# Patient Record
Sex: Female | Born: 1935
Health system: Southern US, Community
[De-identification: ages and names within clinical notes are randomized; demographics above are authoritative.]

## PROBLEM LIST (undated history)

## (undated) DIAGNOSIS — Z8719 Personal history of other diseases of the digestive system: Secondary | ICD-10-CM

## (undated) DIAGNOSIS — I6529 Occlusion and stenosis of unspecified carotid artery: Secondary | ICD-10-CM

## (undated) DIAGNOSIS — J189 Pneumonia, unspecified organism: Secondary | ICD-10-CM

## (undated) DIAGNOSIS — E7800 Pure hypercholesterolemia, unspecified: Secondary | ICD-10-CM

## (undated) DIAGNOSIS — K219 Gastro-esophageal reflux disease without esophagitis: Secondary | ICD-10-CM

## (undated) HISTORY — PX: COLONOSCOPY: SHX174

## (undated) HISTORY — DX: Gastro-esophageal reflux disease without esophagitis: K21.9

## (undated) HISTORY — DX: Occlusion and stenosis of unspecified carotid artery: I65.29

## (undated) HISTORY — PX: ABDOMINAL HYSTERECTOMY: SHX81

## (undated) HISTORY — DX: Pure hypercholesterolemia, unspecified: E78.00

---

## 1958-03-03 DIAGNOSIS — J189 Pneumonia, unspecified organism: Secondary | ICD-10-CM

## 1958-03-03 HISTORY — DX: Pneumonia, unspecified organism: J18.9

## 1997-09-14 ENCOUNTER — Other Ambulatory Visit: Admission: RE | Admit: 1997-09-14 | Discharge: 1997-09-14 | Payer: Self-pay | Admitting: *Deleted

## 1998-10-25 ENCOUNTER — Other Ambulatory Visit: Admission: RE | Admit: 1998-10-25 | Discharge: 1998-10-25 | Payer: Self-pay | Admitting: *Deleted

## 1999-05-10 ENCOUNTER — Encounter: Admission: RE | Admit: 1999-05-10 | Discharge: 1999-05-10 | Payer: Self-pay | Admitting: Pulmonary Disease

## 1999-05-10 ENCOUNTER — Encounter: Payer: Self-pay | Admitting: Pulmonary Disease

## 1999-10-18 ENCOUNTER — Other Ambulatory Visit: Admission: RE | Admit: 1999-10-18 | Discharge: 1999-10-18 | Payer: Self-pay | Admitting: *Deleted

## 2000-05-12 ENCOUNTER — Encounter: Admission: RE | Admit: 2000-05-12 | Discharge: 2000-05-12 | Payer: Self-pay | Admitting: Pulmonary Disease

## 2000-05-12 ENCOUNTER — Encounter: Payer: Self-pay | Admitting: Pulmonary Disease

## 2000-11-16 ENCOUNTER — Other Ambulatory Visit: Admission: RE | Admit: 2000-11-16 | Discharge: 2000-11-16 | Payer: Self-pay | Admitting: *Deleted

## 2001-05-14 ENCOUNTER — Encounter: Payer: Self-pay | Admitting: *Deleted

## 2001-05-14 ENCOUNTER — Encounter: Admission: RE | Admit: 2001-05-14 | Discharge: 2001-05-14 | Payer: Self-pay | Admitting: *Deleted

## 2002-05-16 ENCOUNTER — Encounter: Payer: Self-pay | Admitting: Pulmonary Disease

## 2002-05-16 ENCOUNTER — Encounter: Admission: RE | Admit: 2002-05-16 | Discharge: 2002-05-16 | Payer: Self-pay | Admitting: Pulmonary Disease

## 2003-03-01 ENCOUNTER — Other Ambulatory Visit: Admission: RE | Admit: 2003-03-01 | Discharge: 2003-03-01 | Payer: Self-pay | Admitting: Gynecology

## 2003-05-17 ENCOUNTER — Encounter: Admission: RE | Admit: 2003-05-17 | Discharge: 2003-05-17 | Payer: Self-pay | Admitting: Pulmonary Disease

## 2004-01-22 ENCOUNTER — Ambulatory Visit: Payer: Self-pay | Admitting: Pulmonary Disease

## 2004-05-29 ENCOUNTER — Encounter: Admission: RE | Admit: 2004-05-29 | Discharge: 2004-05-29 | Payer: Self-pay | Admitting: Pulmonary Disease

## 2004-08-16 ENCOUNTER — Ambulatory Visit: Payer: Self-pay | Admitting: Pulmonary Disease

## 2005-03-26 ENCOUNTER — Ambulatory Visit: Payer: Self-pay | Admitting: Pulmonary Disease

## 2005-04-28 ENCOUNTER — Other Ambulatory Visit: Admission: RE | Admit: 2005-04-28 | Discharge: 2005-04-28 | Payer: Self-pay | Admitting: Obstetrics and Gynecology

## 2005-06-18 ENCOUNTER — Encounter: Admission: RE | Admit: 2005-06-18 | Discharge: 2005-06-18 | Payer: Self-pay | Admitting: Pulmonary Disease

## 2005-07-08 ENCOUNTER — Ambulatory Visit: Payer: Self-pay | Admitting: Pulmonary Disease

## 2005-09-25 ENCOUNTER — Ambulatory Visit: Payer: Self-pay | Admitting: Internal Medicine

## 2005-11-06 ENCOUNTER — Ambulatory Visit: Payer: Self-pay | Admitting: Internal Medicine

## 2005-11-12 ENCOUNTER — Ambulatory Visit: Payer: Self-pay | Admitting: Internal Medicine

## 2005-12-02 ENCOUNTER — Ambulatory Visit: Payer: Self-pay | Admitting: Pulmonary Disease

## 2006-01-18 ENCOUNTER — Emergency Department (HOSPITAL_COMMUNITY): Admission: EM | Admit: 2006-01-18 | Discharge: 2006-01-18 | Payer: Self-pay | Admitting: Emergency Medicine

## 2006-03-20 ENCOUNTER — Ambulatory Visit: Payer: Self-pay | Admitting: Pulmonary Disease

## 2006-05-13 ENCOUNTER — Ambulatory Visit: Payer: Self-pay | Admitting: Pulmonary Disease

## 2006-05-13 LAB — CONVERTED CEMR LAB
AST: 30 units/L (ref 0–37)
BUN: 6 mg/dL (ref 6–23)
Basophils Absolute: 0 10*3/uL (ref 0.0–0.1)
Basophils Relative: 0 % (ref 0.0–1.0)
Bilirubin, Direct: 0.1 mg/dL (ref 0.0–0.3)
Creatinine, Ser: 0.8 mg/dL (ref 0.4–1.2)
Glucose, Bld: 93 mg/dL (ref 70–99)
HCT: 36.2 % (ref 36.0–46.0)
HDL: 49.8 mg/dL (ref >39.0)
Hemoglobin, Urine: NEGATIVE
Lymphocytes Relative: 45.1 % (ref 12.0–46.0)
MCHC: 33.8 g/dL (ref 30.0–36.0)
Monocytes Relative: 7.1 % (ref 3.0–11.0)
Neutro Abs: 2.4 10*3/uL (ref 1.4–7.7)
Neutrophils Relative %: 45.6 % (ref 43.0–77.0)
Nitrite: NEGATIVE
Potassium: 4.2 meq/L (ref 3.5–5.1)
RBC: 4.2 M/uL (ref 3.87–5.11)
Specific Gravity, Urine: 1.015 (ref 1.000–1.03)
TSH: 0.62 microintl units/mL (ref 0.35–5.50)
Total Bilirubin: 0.6 mg/dL (ref 0.3–1.2)
Total Protein: 7.1 g/dL (ref 6.0–8.3)
Triglycerides: 137 mg/dL (ref 0–149)
Urobilinogen, UA: 0.2 (ref 0.0–1.0)
VLDL: 27 mg/dL (ref 0–40)

## 2006-07-10 ENCOUNTER — Encounter: Admission: RE | Admit: 2006-07-10 | Discharge: 2006-07-10 | Payer: Self-pay | Admitting: Pulmonary Disease

## 2006-08-18 ENCOUNTER — Ambulatory Visit: Payer: Self-pay | Admitting: Pulmonary Disease

## 2006-08-18 LAB — CONVERTED CEMR LAB: Cholesterol: 165 mg/dL (ref 0–200)

## 2006-09-09 ENCOUNTER — Ambulatory Visit: Payer: Self-pay | Admitting: Pulmonary Disease

## 2007-01-01 ENCOUNTER — Emergency Department (HOSPITAL_COMMUNITY): Admission: EM | Admit: 2007-01-01 | Discharge: 2007-01-01 | Payer: Self-pay | Admitting: Emergency Medicine

## 2007-04-05 ENCOUNTER — Telehealth: Payer: Self-pay | Admitting: Pulmonary Disease

## 2007-06-01 ENCOUNTER — Emergency Department (HOSPITAL_COMMUNITY): Admission: EM | Admit: 2007-06-01 | Discharge: 2007-06-01 | Payer: Self-pay | Admitting: Emergency Medicine

## 2007-08-12 ENCOUNTER — Encounter: Admission: RE | Admit: 2007-08-12 | Discharge: 2007-08-12 | Payer: Self-pay | Admitting: Pulmonary Disease

## 2007-08-13 ENCOUNTER — Telehealth: Payer: Self-pay | Admitting: Pulmonary Disease

## 2007-08-18 ENCOUNTER — Ambulatory Visit: Payer: Self-pay | Admitting: Pulmonary Disease

## 2007-08-23 DIAGNOSIS — K219 Gastro-esophageal reflux disease without esophagitis: Secondary | ICD-10-CM

## 2007-08-23 DIAGNOSIS — E7800 Pure hypercholesterolemia, unspecified: Secondary | ICD-10-CM

## 2007-08-23 DIAGNOSIS — J309 Allergic rhinitis, unspecified: Secondary | ICD-10-CM | POA: Insufficient documentation

## 2007-08-23 DIAGNOSIS — F411 Generalized anxiety disorder: Secondary | ICD-10-CM

## 2007-08-23 DIAGNOSIS — J209 Acute bronchitis, unspecified: Secondary | ICD-10-CM

## 2007-08-23 DIAGNOSIS — L659 Nonscarring hair loss, unspecified: Secondary | ICD-10-CM | POA: Insufficient documentation

## 2007-08-23 LAB — CONVERTED CEMR LAB
ALT: 19 units/L (ref 0–35)
Albumin: 3.8 g/dL (ref 3.5–5.2)
Alkaline Phosphatase: 82 units/L (ref 39–117)
BUN: 8 mg/dL (ref 6–23)
Basophils Absolute: 0 10*3/uL (ref 0.0–0.1)
Bilirubin, Direct: 0.1 mg/dL (ref 0.0–0.3)
CO2: 31 meq/L (ref 19–32)
Eosinophils Absolute: 0.1 10*3/uL (ref 0.0–0.7)
Eosinophils Relative: 2.2 % (ref 0.0–5.0)
GFR calc non Af Amer: 75 mL/min
HCT: 36.3 % (ref 36.0–46.0)
HDL: 46.1 mg/dL (ref >39.0)
Leukocytes, UA: NEGATIVE
Lymphocytes Relative: 41.6 % (ref 12.0–46.0)
Nitrite: NEGATIVE
Specific Gravity, Urine: 1.01 (ref 1.000–1.03)
Total Bilirubin: 0.6 mg/dL (ref 0.3–1.2)
Total CHOL/HDL Ratio: 3.6
Total Protein, Urine: NEGATIVE mg/dL
Total Protein: 6.4 g/dL (ref 6.0–8.3)
Triglycerides: 94 mg/dL (ref 0–149)
Urine Glucose: NEGATIVE mg/dL

## 2007-08-24 ENCOUNTER — Ambulatory Visit: Payer: Self-pay | Admitting: Pulmonary Disease

## 2007-08-24 DIAGNOSIS — M1990 Unspecified osteoarthritis, unspecified site: Secondary | ICD-10-CM | POA: Insufficient documentation

## 2007-08-24 DIAGNOSIS — H409 Unspecified glaucoma: Secondary | ICD-10-CM | POA: Insufficient documentation

## 2007-08-24 DIAGNOSIS — D649 Anemia, unspecified: Secondary | ICD-10-CM | POA: Insufficient documentation

## 2007-09-17 ENCOUNTER — Ambulatory Visit: Payer: Self-pay | Admitting: Internal Medicine

## 2007-09-17 ENCOUNTER — Telehealth (INDEPENDENT_AMBULATORY_CARE_PROVIDER_SITE_OTHER): Payer: Self-pay | Admitting: *Deleted

## 2007-09-17 DIAGNOSIS — L259 Unspecified contact dermatitis, unspecified cause: Secondary | ICD-10-CM

## 2007-09-18 ENCOUNTER — Telehealth: Payer: Self-pay | Admitting: Pulmonary Disease

## 2007-09-18 ENCOUNTER — Emergency Department (HOSPITAL_COMMUNITY): Admission: EM | Admit: 2007-09-18 | Discharge: 2007-09-19 | Payer: Self-pay | Admitting: Emergency Medicine

## 2007-10-20 ENCOUNTER — Ambulatory Visit: Payer: Self-pay | Admitting: Gastroenterology

## 2007-11-01 ENCOUNTER — Telehealth: Payer: Self-pay | Admitting: Gastroenterology

## 2007-11-03 ENCOUNTER — Ambulatory Visit: Payer: Self-pay | Admitting: Gastroenterology

## 2007-12-14 ENCOUNTER — Telehealth (INDEPENDENT_AMBULATORY_CARE_PROVIDER_SITE_OTHER): Payer: Self-pay | Admitting: *Deleted

## 2008-01-12 ENCOUNTER — Telehealth: Payer: Self-pay | Admitting: Gastroenterology

## 2008-06-20 ENCOUNTER — Telehealth (INDEPENDENT_AMBULATORY_CARE_PROVIDER_SITE_OTHER): Payer: Self-pay | Admitting: *Deleted

## 2008-07-13 ENCOUNTER — Telehealth (INDEPENDENT_AMBULATORY_CARE_PROVIDER_SITE_OTHER): Payer: Self-pay | Admitting: *Deleted

## 2008-07-14 ENCOUNTER — Ambulatory Visit: Payer: Self-pay | Admitting: Pulmonary Disease

## 2008-08-23 ENCOUNTER — Encounter: Admission: RE | Admit: 2008-08-23 | Discharge: 2008-08-23 | Payer: Self-pay | Admitting: Pulmonary Disease

## 2008-09-26 ENCOUNTER — Ambulatory Visit: Payer: Self-pay | Admitting: Pulmonary Disease

## 2008-10-01 LAB — CONVERTED CEMR LAB
AST: 27 units/L (ref 0–37)
BUN: 8 mg/dL (ref 6–23)
Basophils Absolute: 0 10*3/uL (ref 0.0–0.1)
Bilirubin, Direct: 0.2 mg/dL (ref 0.0–0.3)
Calcium: 9.4 mg/dL (ref 8.4–10.5)
Creatinine, Ser: 0.7 mg/dL (ref 0.4–1.2)
Eosinophils Absolute: 0.1 10*3/uL (ref 0.0–0.7)
Eosinophils Relative: 1.4 % (ref 0.0–5.0)
HCT: 35 % — ABNORMAL LOW (ref 36.0–46.0)
Lymphocytes Relative: 45.6 % (ref 12.0–46.0)
Lymphs Abs: 2.7 10*3/uL (ref 0.7–4.0)
MCHC: 34.7 g/dL (ref 30.0–36.0)
MCV: 86.7 fL (ref 78.0–100.0)
Monocytes Absolute: 0.6 10*3/uL (ref 0.1–1.0)
Monocytes Relative: 10 % (ref 3.0–12.0)
Triglycerides: 95 mg/dL (ref 0.0–149.0)
VLDL: 19 mg/dL (ref 0.0–40.0)
WBC: 6 10*3/uL (ref 4.5–10.5)

## 2009-02-19 ENCOUNTER — Telehealth: Payer: Self-pay | Admitting: Pulmonary Disease

## 2009-02-21 ENCOUNTER — Telehealth: Payer: Self-pay | Admitting: Pulmonary Disease

## 2009-03-27 ENCOUNTER — Telehealth: Payer: Self-pay | Admitting: Pulmonary Disease

## 2009-09-10 ENCOUNTER — Encounter: Admission: RE | Admit: 2009-09-10 | Discharge: 2009-09-10 | Payer: Self-pay | Admitting: Pulmonary Disease

## 2009-12-14 ENCOUNTER — Ambulatory Visit: Payer: Self-pay | Admitting: Pulmonary Disease

## 2009-12-14 ENCOUNTER — Encounter: Payer: Self-pay | Admitting: Pulmonary Disease

## 2009-12-15 LAB — CONVERTED CEMR LAB
Albumin: 4.1 g/dL (ref 3.5–5.2)
Alkaline Phosphatase: 92 units/L (ref 39–117)
BUN: 9 mg/dL (ref 6–23)
Calcium: 9.3 mg/dL (ref 8.4–10.5)
Cholesterol: 305 mg/dL — ABNORMAL HIGH (ref 0–200)
HCT: 37.1 % (ref 36.0–46.0)
Hemoglobin: 12.8 g/dL (ref 12.0–15.0)
Lymphs Abs: 2.8 10*3/uL (ref 0.7–4.0)
MCHC: 34.5 g/dL (ref 30.0–36.0)
Monocytes Relative: 8.1 % (ref 3.0–12.0)
Neutrophils Relative %: 39.3 % — ABNORMAL LOW (ref 43.0–77.0)
Platelets: 266 10*3/uL (ref 150.0–400.0)
Potassium: 4.3 meq/L (ref 3.5–5.1)
RBC: 4.24 M/uL (ref 3.87–5.11)
RDW: 13.5 % (ref 11.5–14.6)
Total CHOL/HDL Ratio: 5
Triglycerides: 89 mg/dL (ref 0.0–149.0)
VLDL: 17.8 mg/dL (ref 0.0–40.0)

## 2009-12-21 ENCOUNTER — Telehealth (INDEPENDENT_AMBULATORY_CARE_PROVIDER_SITE_OTHER): Payer: Self-pay | Admitting: *Deleted

## 2010-04-02 NOTE — Assessment & Plan Note (Signed)
Summary: cpx/jd   CC:  14 month ROV & review of mult medical problems....  History of Present Illness: 76 y/o BF here for a yearly follow up visit and review... she has multiple medical problems as noted below...    ~  6/09:  she is basically feeling well without new complaints or concerns except for a mild rash on her legs that she feels is coming from the 40mg  dose of Simvastatin... she would like to decr this to 20mg /d as this does not cause her to rash and she would like to know if it does adeq for her cholesterol...   ~  Jul10:  yearly check up- states that she is doing well without new complaints or concerns... she has started taking "enzymes" prior to meals and feels that her digestion is improved & her reflux is resolved (only using the Nexium Prn)... she takes mult supplements which she feels keeps her strong...   ~  December 14, 2009:  she's had a good yr- no new complaints or concerns... she chose to stop the Simva20 & has been on Articoke per DrOz + Fish Oil> FLP shows TChol sky hi at 305 & we decided to restart the Simva20 for now... GI is stable- using probiotics... she still declines BMD measurement stating that he bones are OK & she uses organic skim milk, cheese, & yogurt... she refuses Flu vaccine as well.    Current Problem List:  GLAUCOMA (ICD-365.9) - on eye drops per DrShapiro...  ALLERGY (ICD-995.3) - she uses OTC antihist as needed...  ASTHMATIC BRONCHITIS, ACUTE (ICD-466.0) - no recent problems and no recent URI's either... she exercises in a gym daily!  HYPERCHOLESTEROLEMIA (ICD-272.0) - on Omega3 FishOil + "artichoke" per drOz (she stopped prev Simva20 on her own)... she has a hx of intolerance to meds and couldn't take Lipitor in the past due to leg pains... prev couldn't tolerate Zocor w/ itching, but seems to be doing satis on the SIMVASTATIN although she says it causes a rash on her legs at the 40mg  dose... she decr the Simva to 20mg  and tolerates this well...  ~  FLP 3/08 on diet alone TChol 251, TG 137, HDL 50, LDL 172  ~  FLP 6/08 on Simva40 TChol 165, TG 99, HDL 42, LDL 104  ~  FLP 6/09 on Simva20x2mo shows TChol 166, TG 94, HDL 46, LDL 101  ~  FLP 7/10 on Simva20 showed TChol 186, TG 95, HDL 49, LDL 118  ~  FLP 10/11 on diet alone showed TChol 305, TG 89, HDL 58, LDL 224... she will restart the Simva20.  GERD (ICD-530.81) - on NEXIUM 40mg /d now... she stopped the "enzymes" because they required her to take med Tid every day... now taking probiotic daily.  Family Hx of COLON CANCER (ICD-153.9) - mother Dx w/ colon Ca @ age 19- s/p surg- she's now 89... she previously took a natural laxative weekly "to clean myself out"...  ~  colonoscopy 2/04 by DrPatterson was neg- normal exam...  ~  colonoscopy 9/09 by DrPatterson was neg- except for melanosis...  DEGENERATIVE JOINT DISEASE (ICD-715.90) - eval by DrDean w/ cyst in left wrist... she uses Tylenol as needed.  ~  she takes organic skim milk, cheese, yogurt, Vit D, & numerous supplements...  ANXIETY (ICD-300.00) - uses Chlorazepate 7.5mg  Prn...  ALOPECIA (ICD-704.00)  Hx of ANEMIA (ICD-285.9)  Health Maintenance:  she sees GYN yearly- PAP & Mammogram (6/10 at [facility]= neg)... she hasn't had a BMD & states "  I feel that my exercise & supplements keep my bones strong"...   Allergies (verified): 1)  ! Penicillin 2)  ! Lipitor  Past History:  Past Medical History: GLAUCOMA (ICD-365.9) ALLERGY (ICD-995.3) ASTHMATIC BRONCHITIS, ACUTE (ICD-466.0) HYPERCHOLESTEROLEMIA (ICD-272.0) GERD (ICD-530.81) Family Hx of COLON CANCER (ICD-153.9) DEGENERATIVE JOINT DISEASE (ICD-715.90) ANXIETY (ICD-300.00) ALOPECIA (ICD-704.00) Hx of ANEMIA (ICD-285.9)  Past Surgical History: S/P hysterectomy  Family History: Reviewed history from 09/26/2008 and no changes required. mother alive age 38  lives in a nursing home father deceased age 66  from ??brain anuerysm 1 sister alive and well age 59  Social  History: Reviewed history from 09/26/2008 and no changes required. married 4 grown children 5 grandchildren does not smoke---tried to in college no alcohol use goes to the gym to walk everyday 1 cup of coffee daily  Review of Systems      See HPI  The patient denies anorexia, fever, weight loss, weight gain, vision loss, decreased hearing, hoarseness, chest pain, syncope, dyspnea on exertion, peripheral edema, prolonged cough, headaches, hemoptysis, abdominal pain, melena, hematochezia, severe indigestion/heartburn, hematuria, incontinence, muscle weakness, suspicious skin lesions, transient blindness, difficulty walking, depression, unusual weight change, abnormal bleeding, enlarged lymph nodes, and angioedema.    Vital Signs:  Patient profile:   76 year old female Height:      65 inches Weight:      148.38 pounds BMI:     24.78 O2 Sat:      100 % on Room air Temp:     98.1 degrees F oral Pulse rate:   68 / minute BP sitting:   120 / 84  (left arm) Cuff size:   regular  Vitals Entered By: Gweneth Dimitri RN (December 14, 2009 11:54 AM)  O2 Flow:  Room air CC: 14 month ROV & review of mult medical problems... Is Patient Diabetic? No Pain Assessment Patient in pain? no      Comments Medications reviewed with patient Daytime contact number verified with patient.    Physical Exam  Additional Exam:  WD, WN, 76 y/o BF in NAD... GENERAL:  Alert & oriented; pleasant & cooperative... HEENT:  [place]/AT, EOM-wnl, PERRLA, EACs-clear, TMs-wnl, NOSE-clear, THROAT-clear & wnl. NECK:  Supple w/ fairROM; no JVD; normal carotid impulses w/o bruits; no thyromegaly or nodules palpated; no lymphadenopathy. CHEST:  Clear to P & A; without wheezes/ rales/ or rhonchi heard... HEART:  Regular Rhythm; without murmurs/ rubs/ or gallops detected... ABDOMEN:  Soft & nontender; normal bowel sounds; no organomegaly or masses palpated... EXT: without deformities, mild arthritic changes; no varicose  veins/ venous insuffic/ or edema. NEURO:  CN's intact; motor testing normal; sensory testing normal; gait normal & balance OK. DERM:  neg- no lesions or rash noted...    EKG  Procedure date:  12/14/2009  Findings:      Normal sinus rhythm with rate of:  70/min... Tracing shows min IVCD, otherw WNL...  SN   MISC. Report  Procedure date:  12/14/2009  Findings:      BMP (METABOL)   Sodium                    138 mEq/L                   135-145   Potassium                 4.3 mEq/L                   3.5-5.1  Chloride                  104 mEq/L                   96-112   Carbon Dioxide            25 mEq/L                    19-32   Glucose                   95 mg/dL                    16-10   BUN                       9 mg/dL                     9-60   Creatinine                0.8 mg/dL                   4.5-4.0   Calcium                   9.3 mg/dL                   9.8-11.9   GFR                       86.67 mL/min                >60  Hepatic/Liver Function Panel (HEPATIC)   Total Bilirubin           0.8 mg/dL                   1.4-7.8   Direct Bilirubin          0.1 mg/dL                   2.9-5.6   Alkaline Phosphatase      92 U/L                      39-117   AST                       25 U/L                      0-37   ALT                       18 U/L                      0-35   Total Protein             6.7 g/dL                    2.1-3.0   Albumin                   4.1 g/dL                    8.6-5.7  CBC Platelet w/Diff (CBCD)   White Cell Count          5.6 K/uL  4.5-10.5   Red Cell Count            4.24 Mil/uL                 3.87-5.11   Hemoglobin                12.8 g/dL                   16.1-09.6   Hematocrit                37.1 %                      36.0-46.0   MCV                       87.5 fl                     78.0-100.0   Platelet Count            266.0 K/uL                  150.0-400.0   Neutrophil %         [L]  39.3 %                       43.0-77.0   Lymphocyte %         [H]  50.7 %                      12.0-46.0   Monocyte %                8.1 %                       3.0-12.0   Eosinophils%              1.5 %                       0.0-5.0   Basophils %               0.4 %                       0.0-3.0  TSH (TSH)   FastTSH                   0.93 uIU/mL                 0.35-5.50  Comments:      Lipid Panel (LIPID)   Cholesterol          [H]  305 mg/dL                   0-454   Triglycerides             89.0 mg/dL                  0.9-811.9   HDL                       14.78 mg/dL                 >29.56  Cholesterol LDL - Direct  223.7 mg/dL   Impression & Recommendations:  Problem # 1:  ALLERGY (ICD-995.3) Discussed antihistamines, etc...  Problem # 2:  HYPERCHOLESTEROLEMIA (ICD-272.0) FLP shows marked incr TChol & LDL of the "artichoke" from DrOz & off her prev simva20... she has trouble w/ Lipitor in the past & unable to tol Simva40... she doesn't want more expensive alternative, therefore restart the Simva20...  Orders: 12 Lead EKG (12 Lead EKG) TLB-BMP (Basic Metabolic Panel-BMET) (80048-METABOL) TLB-Hepatic/Liver Function Pnl (80076-HEPATIC) TLB-CBC Platelet - w/Differential (85025-CBCD) TLB-Lipid Panel (80061-LIPID) TLB-TSH (Thyroid Stimulating Hormone) (84443-TSH)  Problem # 3:  GI>>> She has GERD on Nexium daily & fam hx of colonca- w/ neg screening colon 2009...  Problem # 4:  DEGENERATIVE JOINT DISEASE (ICD-715.90) She has seen DrDean in the past... she is doing satis & takes numerous supplements... she refuses BMD testing.  Problem # 5:  ANXIETY (ICD-300.00) The Chlorazepate really helps, she says... Her updated medication list for this problem includes:    Clorazepate Dipotassium 7.5 Mg Tabs (Clorazepate dipotassium) .Marland Kitchen... 1/2-1 tab by mouth three times a day as needed nerves-not to exceed 3 per day  Complete Medication List: 1)  Omega 3 340 Mg Cpdr (Omega-3 fatty  acids) .... Take 1 tablet by mouth once a day 2)  Nexium 40 Mg Cpdr (Esomeprazole magnesium) .... Take one capsule by mouth once daily. 3)  Multivitamins Tabs (Multiple vitamin) .... Take 1 tablet by mouth once a day 4)  Vitamin C 500 Mg Tabs (Ascorbic acid) .... Take 1 tablet by mouth once a day 5)  Vitamin D3 400 Unit Caps (Cholecalciferol) .... Take 1 tablet by mouth once a day 6)  [facility] Vitamin E 400 Unit Caps (Vitamin e) .... Take 1 tablet by mouth once a day 7)  [facility] Magnesium 250 Mg Tabs (Magnesium) .... Take 1 tablet by mouth once a day 8)  Garlic 400 Mg Tabs (Garlic-calcium) .... Take 1 tablet by mouth once a day 9)  Clorazepate Dipotassium 7.5 Mg Tabs (Clorazepate dipotassium) .... 1/2-1 tab by mouth three times a day as needed nerves-not to exceed 3 per day 10)  Simvastatin 20 Mg Tabs (Simvastatin) .... Take 1 tab by mouth at bedtime...  Patient Instructions: 1)  Today we updated your med list- see below.... 2)  We refilled your Nexium & Chlorazepate per request... 3)  Today we did your follow up FASTING blood work... please call the "phone tree" in a few days for your lab results.Marland KitchenMarland Kitchen 4)  Keep up the great job w/ diet + exercise... 5)  Call for any problems.Marland KitchenMarland Kitchen 6)  Please schedule a follow-up appointment in 1 year, sooner as needed... Prescriptions: SIMVASTATIN 20 MG TABS (SIMVASTATIN) take 1 tab by mouth at bedtime...  #30 x 12   Entered and Authorized by:   Michele Mcalpine MD   Signed by:   Michele Mcalpine MD on 12/15/2009   Method used:   Print then Give to Patient   RxID:   [id number] CLORAZEPATE DIPOTASSIUM 7.5 MG TABS (CLORAZEPATE DIPOTASSIUM) 1/2-1 tab by mouth three times a day as needed nerves-not to exceed 3 per day  #90 x 6   Entered and Authorized by:   Michele Mcalpine MD   Signed by:   Michele Mcalpine MD on 12/14/2009   Method used:   Print then Give to Patient   RxID:   [id number] NEXIUM 40 MG  CPDR (ESOMEPRAZOLE MAGNESIUM) take one capsule by mouth once daily.   #30 x 12   Entered  and Authorized by:   Michele Mcalpine MD   Signed by:   Michele Mcalpine MD on 12/14/2009   Method used:   Print then Give to Patient   RxID:   [id number]

## 2010-04-02 NOTE — Progress Notes (Signed)
Summary: rx  Phone Note Call from Patient Call back at Home Phone [phone]   Caller: Patient Call For: nadel Reason for Call: Refill Medication Summary of Call: Patient needing rx--simvastatin 20mg --[facility] [address] Initial call taken by: Lehman Prom,  December 21, 2009 8:39 AM  Follow-up for Phone Call        lmom for pt-- med sent to pharmacy Follow-up by: Philipp Deputy CMA,  December 21, 2009 9:05 AM    Prescriptions: SIMVASTATIN 20 MG TABS (SIMVASTATIN) take 1 tab by mouth at bedtime...  #30 x 12   Entered by:   Philipp Deputy CMA   Authorized by:   Michele Mcalpine MD   Signed by:   Philipp Deputy CMA on 12/21/2009   Method used:   [facility]        [phone][facility] (retail)       [address] [place], [place]  [address]       Ph: [phone] or [phone]       Fax: [phone]   RxID:   [id number]

## 2010-04-02 NOTE — Progress Notes (Signed)
Summary: itching ears/ throat/ cough  Phone Note Call from Patient Call back at Home Phone [phone]   Caller: Patient Call For: Darrie Macmillan Summary of Call: pt c/o cough w/ "extremely itchy ears/ throat since last night. coughing "constantly", non-productive. throat is not sore, denies fever. pt using musinex dm as well as [facility]bitussin. [facility] [address] Initial call taken by: Tivis Ringer, CNA,  March 27, 2009 5:06 PM  Follow-up for Phone Call        Please advise thanks  pt allergic to pcn and lipitor   SN called in medrol dosepak for pt on 1-25. Randell Loop CMA  March 28, 2009 10:14 AM

## 2010-07-10 ENCOUNTER — Telehealth: Payer: Self-pay | Admitting: Pulmonary Disease

## 2010-07-10 NOTE — Telephone Encounter (Signed)
Please advise if okay to refill clorazepate, thanks Last ov with SN 12/14/09 and has one pending 12/19/10.

## 2010-07-11 MED ORDER — CLORAZEPATE DIPOTASSIUM 7.5 MG PO TABS: ORAL_TABLET | ORAL | Status: DC

## 2010-07-11 NOTE — Telephone Encounter (Signed)
Ok to refill clorazepate x 3. thanks

## 2010-07-11 NOTE — Telephone Encounter (Signed)
[facility] spoke w/ kate and she verbalized understanding of rx.  Pt aware rx was called into pharmacy as well. Nothing further was needed

## 2010-07-19 NOTE — Assessment & Plan Note (Signed)
[facility]                             PRIMARY CARE OFFICE NOTE   SHARLOTTE, BAKA                     MRN:          [id number]  DATE:09/25/2005                            DOB:          02-07-1935    CHIEF COMPLAINT:  New patient practice.   HISTORY OF PRESENT ILLNESS:  This patient is a 76 year old African-American  female here to establish primary care.  Patient has seen Dr. Alroy Dust in  the past.  She has a past medical history of elevated cholesterol and a  history of bronchitis.  She states that she has taken Lipitor in the past,  and she believes it caused increased myalgias and joint pain and  discontinued.  She has been reluctant to take cholesterol medications in the  past.  She was recently prescribed Simvastatin 40 mg once a day.  She has  tried her best to change her diet.  She also has been encouraged by her  friends to try red yeast rice.   Her only other history is glaucoma and is status post hysterectomy.  She  still has her ovaries in the remote past.   CURRENT MEDICATIONS:  Multivitamins, vitamin E and C, calcium with vitamin  D/Caltrate 600 mg once a day.   ALLERGIES TO MEDICATIONS:  Penicillin.   SOCIAL HISTORY:  She is married and is semi-retired, self-employed.  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   FAMILY HISTORY:  Mother is alive at age 89, has elevated cholesterol.  Father decreased at age 67 secondary to cerebral hemorrhage.  Patient denies  any family history of cancer.   HABITS:  She has never used tobacco or alcohol in the past.  She had kept up  to date with her health maintenance items.  Her last pap and mammogram are  2007, and last colonoscopy was in 2006 according to the patient.  According  to the chart, her last colonoscopy was in 2004 with Dr. Jarold Motto.   REVIEW OF SYSTEMS:  No fevers, chills.  No HEENT symptoms.  Patient denies  any chest pain or dyspnea on exertion.   She exercises on a regular basis,  denies any heartburn, nausea, vomiting, constipation, diarrhea.  No dark  stools or blood in her stool.   PHYSICAL EXAMINATION:  VITAL SIGNS:  Height is 5 feet 5.5 inches.  Weight is  155 pounds.  Temperature is 97.8.  Pulse is 67.  BP is 103/62.  GENERAL:  The patient is a very pleasant, well-developed, well-nourished 61-  year-old African-American female in no apparent distress.  HEENT:  Normocephalic, atraumatic.  Pupils are equal and reactive to light  bilaterally.  Extraocular motility was intact.  Patient was anicteric.  Conjunctivae was within normal limits.  External auditory canals and  tympanic membranes were clear bilaterally.  Hearing was grossly normal.  Oropharyngeal exam revealed partial upper and lower dental plate, otherwise  unremarkable.  NECK:  Supple and no adenopathy or carotid bruits or thyromegaly.  CHEST:  Normal respiratory effort.  Chest is clear to auscultation  bilaterally.  No rhonchi or rales or  wheezing.  CARDIOVASCULAR:  Regular rate and rhythm.  No significant murmurs, rubs or  gallops appreciated.  ABDOMEN:  Soft, nontender, positive bowel sounds.  No organomegaly.  EXTREMITIES:  No clubbing, cyanosis or edema.  SKIN:  Warm and dry.  NEUROLOGIC:  Cranial nerves 2-12 was grossly intact.  She was nonfocal, and  she ambulates without need of any assistive devices.   IMPRESSION/RECOMMENDATIONS:  Routine physical in a 76 year old African-  American female who is otherwise healthy.  Review of her last lipid panel  shows that her total cholesterol is 217, triglycerides elevated at 159.  LDL  was 145.   I did recommend patient start some form of Statin therapy, and since she had  a poor tolerance of other Statins in the past, we will try a small dose of  Pravastatin 20 mg p.o. q. h.s.  We will repeat her lipids and LFTs.  She is  to report any side effects.  The patient may need additional agents, if her  triglycerides do  not significantly decrease.   She seems to be up to date with her other health maintenance issues.  We  will need to discuss whether she has had a pneumothorax within the last 5-7  years on her followup visit.                                   Barbette Hair. Artist Pais, DO   RDY/MedQ  DD:  09/25/2005  DT:  09/25/2005  Job #:  [id number]

## 2010-08-26 ENCOUNTER — Telehealth: Payer: Self-pay | Admitting: Pulmonary Disease

## 2010-08-26 MED ORDER — PANTOPRAZOLE SODIUM 40 MG PO TBEC: DELAYED_RELEASE_TABLET | ORAL | Status: DC

## 2010-08-26 NOTE — Telephone Encounter (Signed)
Called spoke with patient, advised of SN's recs to change nexium to protonix 40mg  daily.  Pt requests this be [facility] than local pharmacy and rx be deemed BMN.  Pt asked if she may continue to use the nexium until she receives the protonix which i affirmed is fine.  rx printed off for SN to sign as [facility] is not in the epic system.

## 2010-08-26 NOTE — Telephone Encounter (Signed)
Spoke with the patient and she states that she has been having some soreness all over body and she read that this is a side effect of the nexium. She states she tried off of nexium x 1 week. She states her soreness felt better but she had terrible heartburn and wants to know will Dr. Kriste Basque prescribe her something else in place of the nexium and if he thinks the nexium could be causing her soreness. Please advise. Carron Curie, CMA

## 2010-08-26 NOTE — Telephone Encounter (Signed)
Per EMR, [facility]  rx faxed to this mail order pharmacy per [facility].

## 2010-08-26 NOTE — Telephone Encounter (Signed)
Per SN---ok to change nexium to protonix 49mg    1 tablet daily 30 mins prior to the 1st meal of the day.  #30 with prn refills. thanks

## 2010-08-28 ENCOUNTER — Other Ambulatory Visit: Payer: Self-pay | Admitting: Pulmonary Disease

## 2010-08-28 DIAGNOSIS — Z1231 Encounter for screening mammogram for malignant neoplasm of breast: Secondary | ICD-10-CM

## 2010-08-30 ENCOUNTER — Telehealth: Payer: Self-pay | Admitting: Pulmonary Disease

## 2010-08-30 NOTE — Telephone Encounter (Signed)
I spoke with the patient and she states a bee stung her on her left hip and then on the inside of her left thigh. She states it is red, swollen and itching at the area and she feels like the stinger is still in there. The pt denies SOB, swelling in hands, or face, no N/V. No known allergy to bee stings in the past. I advised the pt to try cortisone cream on the site, benadryl per bottle and cool compresses. I advised that if she develops any of the above mentioned symptoms to call office or go to nearest ED. Pt states understanding. Carron Curie, CMA

## 2010-09-18 ENCOUNTER — Ambulatory Visit
Admission: RE | Admit: 2010-09-18 | Discharge: 2010-09-18 | Disposition: A | Discharge status: Home / Self Care | Payer: Medicare Other | Source: Ambulatory Visit | Attending: Pulmonary Disease | Admitting: Pulmonary Disease

## 2010-09-18 DIAGNOSIS — Z1231 Encounter for screening mammogram for malignant neoplasm of breast: Secondary | ICD-10-CM

## 2010-12-11 LAB — URINALYSIS, ROUTINE W REFLEX MICROSCOPIC
Bilirubin Urine: NEGATIVE
Glucose, UA: NEGATIVE
Ketones, ur: NEGATIVE
Nitrite: NEGATIVE
Protein, ur: NEGATIVE
Specific Gravity, Urine: 1.005
pH: 7.5

## 2010-12-11 LAB — CBC
HCT: 34.8 — ABNORMAL LOW
Hemoglobin: 11.8 — ABNORMAL LOW
MCV: 84.7
RBC: 4.11
WBC: 5.9

## 2010-12-11 LAB — COMPREHENSIVE METABOLIC PANEL
ALT: 19
AST: 28
CO2: 28
GFR calc Af Amer: >60
GFR calc non Af Amer: >60
Sodium: 135
Total Bilirubin: 0.6

## 2010-12-11 LAB — DIFFERENTIAL
Basophils Absolute: 0
Eosinophils Relative: 1
Lymphs Abs: 2.1
Monocytes Relative: 10
Neutro Abs: 3
Neutrophils Relative %: 52

## 2010-12-11 LAB — POCT CARDIAC MARKERS: Myoglobin, poc: 72

## 2010-12-18 ENCOUNTER — Telehealth: Payer: Self-pay | Admitting: Pulmonary Disease

## 2010-12-18 DIAGNOSIS — H409 Unspecified glaucoma: Secondary | ICD-10-CM

## 2010-12-18 DIAGNOSIS — J209 Acute bronchitis, unspecified: Secondary | ICD-10-CM

## 2010-12-18 DIAGNOSIS — M1990 Unspecified osteoarthritis, unspecified site: Secondary | ICD-10-CM

## 2010-12-18 DIAGNOSIS — D649 Anemia, unspecified: Secondary | ICD-10-CM

## 2010-12-18 DIAGNOSIS — F411 Generalized anxiety disorder: Secondary | ICD-10-CM

## 2010-12-18 DIAGNOSIS — R0600 Dyspnea, unspecified: Secondary | ICD-10-CM

## 2010-12-18 DIAGNOSIS — E7800 Pure hypercholesterolemia, unspecified: Secondary | ICD-10-CM

## 2010-12-18 NOTE — Telephone Encounter (Signed)
Dr. Kriste Basque, pt has a cpx scheduled for 11/20.  She would like to come in asap this week to have labs done.  Please advise.  Thanks!

## 2010-12-18 NOTE — Telephone Encounter (Signed)
Pt aware she can come for labs and they have been entered in the computer

## 2010-12-19 ENCOUNTER — Encounter: Payer: Self-pay | Admitting: Pulmonary Disease

## 2011-01-15 ENCOUNTER — Other Ambulatory Visit (INDEPENDENT_AMBULATORY_CARE_PROVIDER_SITE_OTHER): Payer: Medicare Other

## 2011-01-15 DIAGNOSIS — D649 Anemia, unspecified: Secondary | ICD-10-CM

## 2011-01-15 DIAGNOSIS — R0989 Other specified symptoms and signs involving the circulatory and respiratory systems: Secondary | ICD-10-CM

## 2011-01-15 DIAGNOSIS — R0609 Other forms of dyspnea: Secondary | ICD-10-CM

## 2011-01-15 DIAGNOSIS — R0600 Dyspnea, unspecified: Secondary | ICD-10-CM

## 2011-01-15 DIAGNOSIS — H409 Unspecified glaucoma: Secondary | ICD-10-CM

## 2011-01-15 DIAGNOSIS — E7800 Pure hypercholesterolemia, unspecified: Secondary | ICD-10-CM

## 2011-01-15 LAB — CBC WITH DIFFERENTIAL/PLATELET
Basophils Absolute: 0 10*3/uL (ref 0.0–0.1)
Eosinophils Absolute: 0.1 10*3/uL (ref 0.0–0.7)
HCT: 37.2 % (ref 36.0–46.0)
Lymphs Abs: 2.6 10*3/uL (ref 0.7–4.0)
MCHC: 33.9 g/dL (ref 30.0–36.0)
Monocytes Relative: 7.4 % (ref 3.0–12.0)
Neutro Abs: 3.4 10*3/uL (ref 1.4–7.7)
Platelets: 248 10*3/uL (ref 150.0–400.0)
RDW: 16 % — ABNORMAL HIGH (ref 11.5–14.6)

## 2011-01-15 LAB — URINALYSIS
Bilirubin Urine: NEGATIVE
Hgb urine dipstick: NEGATIVE
Total Protein, Urine: NEGATIVE
Urine Glucose: NEGATIVE

## 2011-01-15 LAB — HEPATIC FUNCTION PANEL
AST: 18 U/L (ref 0–37)
Albumin: 4.2 g/dL (ref 3.5–5.2)
Alkaline Phosphatase: 89 U/L (ref 39–117)
Bilirubin, Direct: 0.1 mg/dL (ref 0.0–0.3)
Total Protein: 7.3 g/dL (ref 6.0–8.3)

## 2011-01-15 LAB — BASIC METABOLIC PANEL
CO2: 27 mEq/L (ref 19–32)
GFR: 105.18 mL/min (ref >60.00)
Glucose, Bld: 83 mg/dL (ref 70–99)
Potassium: 4.5 mEq/L (ref 3.5–5.1)
Sodium: 136 mEq/L (ref 135–145)

## 2011-01-15 LAB — TSH: TSH: 1.3 u[IU]/mL (ref 0.35–5.50)

## 2011-01-15 LAB — LIPID PANEL: Total CHOL/HDL Ratio: 3

## 2011-01-21 ENCOUNTER — Encounter: Payer: Self-pay | Admitting: Pulmonary Disease

## 2011-01-21 ENCOUNTER — Ambulatory Visit (INDEPENDENT_AMBULATORY_CARE_PROVIDER_SITE_OTHER): Payer: Medicare Other | Admitting: Pulmonary Disease

## 2011-01-21 DIAGNOSIS — T7840XA Allergy, unspecified, initial encounter: Secondary | ICD-10-CM

## 2011-01-21 DIAGNOSIS — J209 Acute bronchitis, unspecified: Secondary | ICD-10-CM

## 2011-01-21 DIAGNOSIS — E7800 Pure hypercholesterolemia, unspecified: Secondary | ICD-10-CM

## 2011-01-21 DIAGNOSIS — L259 Unspecified contact dermatitis, unspecified cause: Secondary | ICD-10-CM

## 2011-01-21 DIAGNOSIS — K219 Gastro-esophageal reflux disease without esophagitis: Secondary | ICD-10-CM

## 2011-01-21 DIAGNOSIS — M1990 Unspecified osteoarthritis, unspecified site: Secondary | ICD-10-CM

## 2011-01-21 DIAGNOSIS — F411 Generalized anxiety disorder: Secondary | ICD-10-CM

## 2011-01-21 NOTE — Patient Instructions (Signed)
Today we updated your med list in our EPIC system...    Continue your current medications the same...  You look great> keep up the good work...  Call for any problems...  Let's plan a follow up evaluation in 1 year's time.Marland KitchenMarland Kitchen

## 2011-02-20 ENCOUNTER — Encounter: Payer: Self-pay | Admitting: Pulmonary Disease

## 2011-02-20 NOTE — Progress Notes (Signed)
Subjective:     Patient ID: Hannah Madden, female   DOB: [birth year], 76 y.o.   MRN: [id number]  HPI 76 y/o BF here for a yearly follow up visit and review... she has multiple medical problems as noted below...   ~  Jun09:  she is basically feeling well without new complaints or concerns except for a mild rash on her legs that she feels is coming from the 40mg  dose of Simvastatin... she would like to decr this to 20mg /d as this does not cause her to rash and she would like to know if it does adeq for her cholesterol...  ~  Jul10:  yearly check up- states that she is doing well without new complaints or concerns... she has started taking "enzymes" prior to meals and feels that her digestion is improved & her reflux is resolved (only using the Nexium Prn)... she takes mult supplements which she feels keeps her strong...  ~  December 14, 2009:  she's had a good yr- no new complaints or concerns... she chose to stop the Simva20 & has been on Articoke per Hannah Madden + Fish Oil> FLP shows TChol sky hi at 305 & she agreed to restart the Simva20 for now... GI is stable- using probiotics... she still declines BMD measurement stating that he bones are OK & she uses organic skim milk, cheese, & yogurt... she refuses Flu vaccine as well.  ~  January 21, 2011:  Yearly ROV & Hannah Madden says she is further improved following the recommendations in a nutrition book called "The Dirty Dozen"; states she is eating natural brown sugar & natural butter etc (I advised her to avoid sugars & sweets & eliminated butter, sat fats, etc)... AB> denies breathing problems; she works out in [employer] regularly... CHOL> she notes that she takes the Simva20 "sometimes"; FLP actually looks ok w/ LDL 118, encouraged to use it "regularly"... GI> GERD, FamHx colon cancer> stable on Nexium40/d & supplements; last colon 2009 was neg per Hannah Madden... DJD> she reports fall w/ injury to left shoulder 5/12; seen by Hannah Madden & PT helped, along w/ muscle  relaxer & pain med. Anxiety> uses Tranxene as needed; caring for her 41 y/o mother is stressful she says... Derm> followed by Hannah Madden on cream for rash; "I'm allergic to acid foods & the cream helps"...          Problem List:  GLAUCOMA (ICD-365.9) - on eye drops per Hannah Madden...  ALLERGY (ICD-995.3) - she uses OTC antihist as needed...  ASTHMATIC BRONCHITIS, ACUTE (ICD-466.0) - no recent problems and no recent URI's either... she exercises in a gym daily!  HYPERCHOLESTEROLEMIA (ICD-272.0) - she stopped the Omega3 FishOil + "artichoke" per Hannah Madden; she states that she takes the SIMVASTATIN 20mg  "sometimes" & is on a neg diet plan... she has a hx of intolerance to meds and couldn't take Lipitor in the past due to leg pains... prev couldn't tolerate Zocor w/ itching, but seems to be doing satis on the SIMVASTATIN although she says it causes a rash on her legs at the 40mg  dose... she decr the Simva to 20mg  and tolerates this well... ~  FLP 3/08 on diet alone TChol 251, TG 137, HDL 50, LDL 172 ~  FLP 6/08 on Simva40 TChol 165, TG 99, HDL 42, LDL 104 ~  FLP 6/09 on Simva20x74mo shows TChol 166, TG 94, HDL 46, LDL 101 ~  FLP 7/10 on Simva20 showed TChol 186, TG 95, HDL 49, LDL 118 ~  FLP 10/11 on diet  alone showed TChol 305, TG 89, HDL 58, LDL 224... she will restart the Simva20. ~  FLP 11/12 on diet & Simva "sometimes" showed TChol 191, TG 87, HDL 56, LDL 118  GERD (ICD-530.81) - on NEXIUM 40mg /d now... she stopped the "enzymes" because they required her to take med Tid every day... She takes Probiotics as needed...  Family Hx of COLON CANCER (ICD-153.9) - mother Dx w/ colon Ca @ age 88- s/p surg- she's now 100... she previously took a natural laxative weekly "to clean myself out"... ~  colonoscopy 2/04 by Hannah Madden was neg- normal exam... ~  colonoscopy 9/09 by Hannah Madden was neg- except for melanosis...  DEGENERATIVE JOINT DISEASE (ICD-715.90) - eval by Hannah Madden w/ cyst in left wrist... she  uses Tylenol as needed. ~  she takes organic skim milk, cheese, yogurt, Vit D, & numerous supplements... ~  She reports fall 5/12 w/ injury to left shoulder & groin; PT from Hannah Madden helped along w/ pain med & muscle relaxer...  Pt has repeatedly refused to do a baseline Bone Density test> again we discussed the need for this important screening procedure...  ~  Labs 11/12 showed Vit D level = 41 on her supplement regimen...  ANXIETY (ICD-300.00) - uses Chlorazepate 7.5mg  Prn... She is caring for her 35 y/o mother.  ALOPECIA (ICD-704.00)  Hx of ANEMIA (ICD-285.9) ~  Labs 11/12 showed Hg= 12.6, MCV= 84  Health Maintenance:  she sees GYN yearly- PAP & Mammogram (6/10 at [facility]= neg)... she hasn't had a BMD & states "I feel that my exercise & supplements keep my bones strong"...   Past Surgical History  Procedure Date  . Abdominal hysterectomy     Outpatient Encounter Prescriptions as of 01/21/2011  Medication Sig Dispense Refill  . cholecalciferol (VITAMIN D) 1000 UNITS tablet Take 1,000 Units by mouth daily.        . clorazepate (TRANXENE) 7.5 MG tablet 1/2-1 tablet by mouth 3 times a day as needed for nerves--not to exceed 3 per day  90 tablet  3  . Coenzyme Q10 (COQ10) 100 MG CAPS Take 1 capsule by mouth daily.        Marland Kitchen esomeprazole (NEXIUM) 40 MG capsule Take 40 mg by mouth daily before breakfast.        . Garlic 400 MG TABS Take 1 tablet by mouth daily.        . Magnesium 250 MG TABS Take 1 tablet by mouth daily.        . simvastatin (ZOCOR) 20 MG tablet Take 20 mg by mouth at bedtime. Takes prn      . vitamin C (ASCORBIC ACID) 500 MG tablet Take 500 mg by mouth daily.        . vitamin E 400 UNIT capsule Take 400 Units by mouth daily.          Allergies  Allergen Reactions  . Atorvastatin     REACTION: INTOL to Lipitor w/ leg pain  . Penicillins     REACTION: hives    Current Medications, Allergies, Past Medical History, Past Surgical History, Family History, and Social  History were reviewed in [employer] record.    Review of Systems         See HPI - all other systems neg except as noted...  The patient denies anorexia, fever, weight loss, weight gain, vision loss, decreased hearing, hoarseness, chest pain, syncope, dyspnea on exertion, peripheral edema, prolonged cough, headaches, hemoptysis, abdominal pain, melena, hematochezia, severe indigestion/heartburn,  hematuria, incontinence, muscle weakness, suspicious skin lesions, transient blindness, difficulty walking, depression, unusual weight change, abnormal bleeding, enlarged lymph nodes, and angioedema.     Objective:   Physical Exam     WD, WN, 76 y/o BF in NAD... GENERAL:  Alert & oriented; pleasant & cooperative... HEENT:  [place]/AT, EOM-wnl, PERRLA, EACs-clear, TMs-wnl, NOSE-clear, THROAT-clear & wnl. NECK:  Supple w/ fairROM; no JVD; normal carotid impulses w/o bruits; no thyromegaly or nodules palpated; no lymphadenopathy. CHEST:  Clear to P & A; without wheezes/ rales/ or rhonchi heard... HEART:  Regular Rhythm; without murmurs/ rubs/ or gallops detected... ABDOMEN:  Soft & nontender; normal bowel sounds; no organomegaly or masses palpated... EXT: without deformities, mild arthritic changes; no varicose veins/ venous insuffic/ or edema. NEURO:  CN's intact; motor testing normal; sensory testing normal; gait normal & balance OK. DERM:  neg- no lesions or rash noted...  RADIOLOGY DATA:  Reviewed in the EPIC EMR & discussed w/ the patient...  LABORATORY DATA:  Reviewed in the EPIC EMR & discussed w/ the patient...   Assessment:     AB>  She denies problem; no recent URIs or AB exac; not on regular meds...  Hypercholesterolemia>  She admits to taking the Simva20 only "sometimes" relying on her new diet for control; FLP actually looks pretty good w/ TChol 191 & LDL 118 so advised ok to continue what she is doing...  GERD>  Stable on Nexium Rx...  FamHx Colon Ca>  Mother  had colon Ca at age 31 & is now 2 y/o; Daella'st colonoscopy was 2009 by [facility] & neg...  DJD>  Followed by Hannah Madden & stable, using OTC analgesics prn...  Anxiety>  On Tranxene as needed, caring for her 51 y/o mother...   Plan:     Patient's Medications  New Prescriptions   No medications on file  Previous Medications   CHOLECALCIFEROL (VITAMIN D) 1000 UNITS TABLET    Take 1,000 Units by mouth daily.     CLORAZEPATE (TRANXENE) 7.5 MG TABLET    1/2-1 tablet by mouth 3 times a day as needed for nerves--not to exceed 3 per day   COENZYME Q10 (COQ10) 100 MG CAPS    Take 1 capsule by mouth daily.     ESOMEPRAZOLE (NEXIUM) 40 MG CAPSULE    Take 40 mg by mouth daily before breakfast.     GARLIC 400 MG TABS    Take 1 tablet by mouth daily.     MAGNESIUM 250 MG TABS    Take 1 tablet by mouth daily.     SIMVASTATIN (ZOCOR) 20 MG TABLET    Take 20 mg by mouth at bedtime. Takes prn   VITAMIN C (ASCORBIC ACID) 500 MG TABLET    Take 500 mg by mouth daily.     VITAMIN E 400 UNIT CAPSULE    Take 400 Units by mouth daily.    Modified Medications   No medications on file  Discontinued Medications   CHOLECALCIFEROL (VITAMIN D-400) 400 UNITS TABS    Take 400 Units by mouth daily.     FISH OIL-OMEGA-3 FATTY ACIDS 1000 MG CAPSULE    Take 2 g by mouth daily.     MULTIPLE VITAMINS-MINERALS (MULTIVITAMIN & MINERAL PO)    Take 1 tablet by mouth daily.     PANTOPRAZOLE (PROTONIX) 40 MG TABLET    Take 1 tablet by mouth 30 minutes before first meal of the day.

## 2011-03-25 ENCOUNTER — Telehealth: Payer: Self-pay | Admitting: Pulmonary Disease

## 2011-03-25 MED ORDER — CLORAZEPATE DIPOTASSIUM 7.5 MG PO TABS: ORAL_TABLET | ORAL | Status: DC

## 2011-03-25 MED ORDER — ESOMEPRAZOLE MAGNESIUM 40 MG PO CPDR: 40.0000 mg | DELAYED_RELEASE_CAPSULE | Freq: Every day | ORAL | Status: DC

## 2011-03-25 NOTE — Telephone Encounter (Signed)
rx for the clorazepate has been faxed to the pharmacy.

## 2011-03-25 NOTE — Telephone Encounter (Signed)
I have sent nexium in and printed off rx for pt's clorazepate to have SN sign so [facility]. Please advise Dr. Kriste Basque, thanks

## 2011-05-13 ENCOUNTER — Telehealth: Payer: Self-pay | Admitting: Pulmonary Disease

## 2011-05-13 MED ORDER — AZITHROMYCIN 250 MG PO TABS: ORAL_TABLET | ORAL | Status: DC

## 2011-05-13 NOTE — Telephone Encounter (Signed)
Pt returning call says to cb and speak with her husband sam at [phone].Hannah Madden

## 2011-05-13 NOTE — Telephone Encounter (Signed)
Spoke with pt. She is c/o sinus pressure and "head stopped up" x 4 days. She states that she has been taking claritin and benadryl without relief. Denies cough or fever. She is unable to take decongestants. Would like something called in. Please advise, thanks! Allergies  Allergen Reactions  . Atorvastatin     REACTION: INTOL to Lipitor w/ leg pain  . Penicillins     REACTION: hives

## 2011-05-13 NOTE — Telephone Encounter (Signed)
Per SN give mucinex 600mg  take 2 tablets bid with fluid, nasal saline every hour while awake, zpak #1 as directed, and align one daily. Carron Curie, CMA

## 2011-05-13 NOTE — Telephone Encounter (Signed)
LMOM for pt TCB 

## 2011-05-13 NOTE — Telephone Encounter (Signed)
Pt called back again. (see last msg in epic). I advised her per megan that zpac had been called in- I also advised re: sn's recs- all info had been left on answering machine previously per spouse's request. (pt acknowledged that she got the info). However, she has 2 new questions: 1) she took a 24 hr claritin at 8am today- any problems with this? 2) can she begin taking musinex "right away".

## 2011-05-13 NOTE — Telephone Encounter (Signed)
Pt return call °

## 2011-05-13 NOTE — Telephone Encounter (Signed)
Called and spoke with pt. Pt stated she had a question on when she should start taking the Mucinex.  States she already took Claritin 24 hr at 8 am and didn't want to "overlap" with too many medications.  Informed pt that she was ok to wait until tomorrow morning to start the Mucinex if she was too worried about starting it tonight.  Pt verbalized understanding and denied any further questions.  Nothing further needed.

## 2011-05-13 NOTE — Telephone Encounter (Signed)
Called and spoke with pt's husband.  Attempted to inform husband of SN's recs.  Husband asked that i call the house # back and leave the detailed message on their answering machine for pt to listen to when she gets home.  Called home # back again and left detailed message.

## 2011-05-14 ENCOUNTER — Telehealth: Payer: Self-pay | Admitting: Pulmonary Disease

## 2011-05-14 NOTE — Telephone Encounter (Signed)
Spoke with pt. She states started on zpack yesterday and has had some diarrhea this am. She has taken med before without any issues. I asked if she is taking the align with this and rec per SN. She states could not find align at the pharmacy, so bought a generic probiotic but has not started this. I have rec that she try taking this along with immodium for any more diarrhea and call here if she is not improving. Also rec that she increase fluids. Pt verbalized understanding and states nothing further needed.

## 2011-05-15 ENCOUNTER — Encounter (HOSPITAL_COMMUNITY): Payer: Self-pay | Admitting: *Deleted

## 2011-05-15 ENCOUNTER — Emergency Department (HOSPITAL_COMMUNITY)
Admission: EM | Admit: 2011-05-15 | Discharge: 2011-05-15 | Disposition: A | Discharge status: Home / Self Care | Payer: Medicare Other | Attending: Emergency Medicine | Admitting: Emergency Medicine

## 2011-05-15 ENCOUNTER — Telehealth: Payer: Self-pay | Admitting: Pulmonary Disease

## 2011-05-15 DIAGNOSIS — K219 Gastro-esophageal reflux disease without esophagitis: Secondary | ICD-10-CM | POA: Diagnosis not present

## 2011-05-15 DIAGNOSIS — R197 Diarrhea, unspecified: Secondary | ICD-10-CM | POA: Insufficient documentation

## 2011-05-15 DIAGNOSIS — R1110 Vomiting, unspecified: Secondary | ICD-10-CM | POA: Diagnosis not present

## 2011-05-15 DIAGNOSIS — Z79899 Other long term (current) drug therapy: Secondary | ICD-10-CM | POA: Insufficient documentation

## 2011-05-15 DIAGNOSIS — E7800 Pure hypercholesterolemia, unspecified: Secondary | ICD-10-CM | POA: Diagnosis not present

## 2011-05-15 LAB — BASIC METABOLIC PANEL
Calcium: 9.5 mg/dL (ref 8.4–10.5)
Creatinine, Ser: 0.72 mg/dL (ref 0.50–1.10)
GFR calc Af Amer: >90 mL/min (ref >90)
GFR calc non Af Amer: 83 mL/min — ABNORMAL LOW (ref >90)
Sodium: 135 mEq/L (ref 135–145)

## 2011-05-15 LAB — URINALYSIS, ROUTINE W REFLEX MICROSCOPIC
Glucose, UA: NEGATIVE mg/dL
Ketones, ur: NEGATIVE mg/dL
Nitrite: NEGATIVE
Protein, ur: NEGATIVE mg/dL
Urobilinogen, UA: 0.2 mg/dL (ref 0.0–1.0)

## 2011-05-15 LAB — CBC
HCT: 37.7 % (ref 36.0–46.0)
MCHC: 34.2 g/dL (ref 30.0–36.0)
MCV: 84.2 fL (ref 78.0–100.0)
Platelets: 259 10*3/uL (ref 150–400)
RDW: 13.1 % (ref 11.5–15.5)
WBC: 10.2 10*3/uL (ref 4.0–10.5)

## 2011-05-15 LAB — DIFFERENTIAL
Basophils Absolute: 0 10*3/uL (ref 0.0–0.1)
Basophils Relative: 0 % (ref 0–1)
Eosinophils Absolute: 0 10*3/uL (ref 0.0–0.7)
Eosinophils Relative: 0 % (ref 0–5)
Lymphocytes Relative: 9 % — ABNORMAL LOW (ref 12–46)
Monocytes Absolute: 0.4 10*3/uL (ref 0.1–1.0)

## 2011-05-15 LAB — URINE MICROSCOPIC-ADD ON

## 2011-05-15 MED ORDER — METOCLOPRAMIDE HCL 10 MG PO TABS: 10.0000 mg | ORAL_TABLET | Freq: Four times a day (QID) | ORAL | Status: DC | PRN

## 2011-05-15 MED ORDER — ONDANSETRON HCL 4 MG/2ML IJ SOLN
4.0000 mg | Freq: Once | INTRAMUSCULAR | Status: AC
  Administered 2011-05-15: 4 mg via INTRAVENOUS
  Filled 2011-05-15: qty 2

## 2011-05-15 MED ORDER — SODIUM CHLORIDE 0.9 % IV SOLN
Freq: Once | INTRAVENOUS | Status: AC
  Administered 2011-05-15: 07:00:00 via INTRAVENOUS

## 2011-05-15 NOTE — ED Notes (Signed)
Pt states that she has N/V since 0100 this am. N/V x 11 episodes.

## 2011-05-15 NOTE — ED Notes (Signed)
Pt and family agitated with long delay in treatment. MD notified states we will be getting her home soon.

## 2011-05-15 NOTE — Telephone Encounter (Signed)
Attempted to call pt but no answer and no machine.  Will try back later.  

## 2011-05-15 NOTE — Discharge Instructions (Signed)
I suspect that her nausea is from the antibiotic which she restarted on yesterday. When to stop taking the antibiotic. If diarrhea starts again, you can take Imodium right ear as needed. For the nausea, take metoclopramide every 6 hours as needed. Return to the emergency department if symptoms are getting worse.  Nausea and Vomiting Nausea is a sick feeling that often comes before throwing up (vomiting). Vomiting is a reflex where stomach contents come out of your mouth. Vomiting can cause severe loss of body fluids (dehydration). Children and elderly adults can become dehydrated quickly, especially if they also have diarrhea. Nausea and vomiting are symptoms of a condition or disease. It is important to find the cause of your symptoms. CAUSES   Direct irritation of the stomach lining. This irritation can result from increased acid production (gastroesophageal reflux disease), infection, food poisoning, taking certain medicines (such as nonsteroidal anti-inflammatory drugs), alcohol use, or tobacco use.   Signals from the brain.These signals could be caused by a headache, heat exposure, an inner ear disturbance, increased pressure in the brain from injury, infection, a tumor, or a concussion, pain, emotional stimulus, or metabolic problems.   An obstruction in the gastrointestinal tract (bowel obstruction).   Illnesses such as diabetes, hepatitis, gallbladder problems, appendicitis, kidney problems, cancer, sepsis, atypical symptoms of a heart attack, or eating disorders.   Medical treatments such as chemotherapy and radiation.   Receiving medicine that makes you sleep (general anesthetic) during surgery.  DIAGNOSIS Your caregiver may ask for tests to be done if the problems do not improve after a few days. Tests may also be done if symptoms are severe or if the reason for the nausea and vomiting is not clear. Tests may include:  Urine tests.   Blood tests.   Stool tests.   Cultures (to  look for evidence of infection).   X-rays or other imaging studies.  Test results can help your caregiver make decisions about treatment or the need for additional tests. TREATMENT You need to stay well hydrated. Drink frequently but in small amounts.You may wish to drink water, sports drinks, clear broth, or eat frozen ice pops or gelatin dessert to help stay hydrated.When you eat, eating slowly may help prevent nausea.There are also some antinausea medicines that may help prevent nausea. HOME CARE INSTRUCTIONS   Take all medicine as directed by your caregiver.   If you do not have an appetite, do not force yourself to eat. However, you must continue to drink fluids.   If you have an appetite, eat a normal diet unless your caregiver tells you differently.   Eat a variety of complex carbohydrates (rice, wheat, potatoes, bread), lean meats, yogurt, fruits, and vegetables.   Avoid high-fat foods because they are more difficult to digest.   Drink enough water and fluids to keep your urine clear or pale yellow.   If you are dehydrated, ask your caregiver for specific rehydration instructions. Signs of dehydration may include:   Severe thirst.   Dry lips and mouth.   Dizziness.   Dark urine.   Decreasing urine frequency and amount.   Confusion.   Rapid breathing or pulse.  SEEK IMMEDIATE MEDICAL CARE IF:   You have blood or brown flecks (like coffee grounds) in your vomit.   You have black or bloody stools.   You have a severe headache or stiff neck.   You are confused.   You have severe abdominal pain.   You have chest pain or trouble breathing.  You do not urinate at least once every 8 hours.   You develop cold or clammy skin.   You continue to vomit for longer than 24 to 48 hours.   You have a fever.  MAKE SURE YOU:   Understand these instructions.   Will watch your condition.   Will get help right away if you are not doing well or get worse.  Document  Released: 02/17/2005 Document Revised: 02/06/2011 Document Reviewed: 07/17/2010 [facility] Patient Information 2012 [place], [place].  Metoclopramide tablets What is this medicine? METOCLOPRAMIDE (met oh kloe PRA mide) is used to treat the symptoms of gastroesophageal reflux disease (GERD) like heartburn. It is also used to treat people with slow emptying of the stomach and intestinal tract. This medicine may be used for other purposes; ask your health care provider or pharmacist if you have questions. What should I tell my health care provider before I take this medicine? They need to know if you have any of these conditions: -breast cancer -depression -diabetes -heart failure -high blood pressure -kidney disease -liver disease -Parkinson's disease or a movement disorder -pheochromocytoma -seizures -stomach obstruction, bleeding, or perforation -an unusual or allergic reaction to metoclopramide, procainamide, sulfites, other medicines, foods, dyes, or preservatives -pregnant or trying to get pregnant -breast-feeding How should I use this medicine? Take this medicine by mouth with a glass of water. Follow the directions on the prescription label. Take this medicine on an empty stomach, about 30 minutes before eating. Take your doses at regular intervals. Do not take your medicine more often than directed. Do not stop taking except on the advice of your doctor or health care professional. A special MedGuide will be given to you by the pharmacist with each prescription and refill. Be sure to read this information carefully each time. Talk to your pediatrician regarding the use of this medicine in children. Special care may be needed. Overdosage: If you think you have taken too much of this medicine contact a poison control center or emergency room at once. NOTE: This medicine is only for you. Do not share this medicine with others. What if I miss a dose? If you miss a dose, take it as soon  as you can. If it is almost time for your next dose, take only that dose. Do not take double or extra doses. What may interact with this medicine? -acetaminophen -cyclosporine -digoxin -medicines for blood pressure -medicines for diabetes, including insulin -medicines for hay fever and other allergies -medicines for depression, especially an Monoamine Oxidase Inhibitor (MAOI) -medicines for Parkinson's disease, like levodopa -medicines for sleep or for pain -tetracycline This list may not describe all possible interactions. Give your health care provider a list of all the medicines, herbs, non-prescription drugs, or dietary supplements you use. Also tell them if you smoke, drink alcohol, or use illegal drugs. Some items may interact with your medicine. What should I watch for while using this medicine? It may take a few weeks for your stomach condition to start to get better. However, do not take this medicine for longer than 12 weeks. The longer you take this medicine, and the more you take it, the greater your chances are of developing serious side effects. If you are an elderly patient, a female patient, or you have diabetes, you may be at an increased risk for side effects from this medicine. Contact your doctor immediately if you start having movements you cannot control such as lip smacking, rapid movements of the tongue, involuntary or uncontrollable  movements of the eyes, head, arms and legs, or muscle twitches and spasms. Patients and their families should watch out for worsening depression or thoughts of suicide. Also watch out for any sudden or severe changes in feelings such as feeling anxious, agitated, panicky, irritable, hostile, aggressive, impulsive, severely restless, overly excited and hyperactive, or not being able to sleep. If this happens, especially at the beginning of treatment or after a change in dose, call your doctor. Do not treat yourself for high fever. Ask your doctor  or health care professional for advice. You may get drowsy or dizzy. Do not drive, use machinery, or do anything that needs mental alertness until you know how this drug affects you. Do not stand or sit up quickly, especially if you are an older patient. This reduces the risk of dizzy or fainting spells. Alcohol can make you more drowsy and dizzy. Avoid alcoholic drinks. What side effects may I notice from receiving this medicine? Side effects that you should report to your doctor or health care professional as soon as possible: -allergic reactions like skin rash, itching or hives, swelling of the face, lips, or tongue -abnormal production of milk in females -breast enlargement in both males and females -change in the way you walk -difficulty moving, speaking or swallowing -drooling, lip smacking, or rapid movements of the tongue -excessive sweating -fever -involuntary or uncontrollable movements of the eyes, head, arms and legs -irregular heartbeat or palpitations -muscle twitches and spasms -unusually weak or tired Side effects that usually do not require medical attention (report to your doctor or health care professional if they continue or are bothersome): -change in sex drive or performance -depressed mood -diarrhea -difficulty sleeping -headache -menstrual changes -restless or nervous This list may not describe all possible side effects. Call your doctor for medical advice about side effects. You may report side effects to FDA at 1-800-FDA-1088. Where should I keep my medicine? Keep out of the reach of children. Store at room temperature between 20 and 25 degrees C (68 and 77 degrees F). Protect from light. Keep container tightly closed. Throw away any unused medicine after the expiration date. NOTE: This sheet is a summary. It may not cover all possible information. If you have questions about this medicine, talk to your doctor, pharmacist, or health care provider.  2012,  [employer]. (10/13/2007 4:30:05 PM)

## 2011-05-15 NOTE — ED Notes (Signed)
MD at bedside. Dr. Glick. 

## 2011-05-15 NOTE — ED Provider Notes (Signed)
History     CSN: [id number]  Arrival date & time 05/15/11  4098   First MD Initiated Contact with Patient 05/15/11 [phone]      No chief complaint on file.   (Consider location/radiation/quality/duration/timing/severity/associated sxs/prior treatment) Patient is a 77 y.o. female presenting with vomiting. The history is provided by the patient.  Emesis   She started taking it azithromycin yesterday for a sinus infection. After taking azithromycin, she had several loose stools, but diarrhea has stopped. At 1 AM, she started having nausea and vomiting and has vomited multiple times. She does not feel like she is going to have any more diarrhea. She denies fever, chills, sweats. She denies abdominal pain and chest pain and dyspnea. She is not aware of any sick contacts. The azithromycin was prescribed because she felt like her sinuses were stopped up, and she had also been prescribed Mucinex. Her symptoms are described as severe. Nothing makes them better nothing makes them worse.  Past Medical History  Diagnosis Date  . Glaucoma   . Allergy history unknown   . Asthmatic bronchitis   . Hypercholesteremia   . GERD (gastroesophageal reflux disease)   . Family history of colon cancer   . DJD (degenerative joint disease)   . Anxiety   . Alopecia   . Anemia     Past Surgical History  Procedure Date  . Abdominal hysterectomy     No family history on file.  History  Substance Use Topics  . Smoking status: Former Games developer  . Smokeless tobacco: Not on file   Comment: tried to smoke in college  . Alcohol Use: Not on file    OB History    Grav Para Term Preterm Abortions TAB SAB Ect Mult Living                  Review of Systems  Gastrointestinal: Positive for vomiting.  All other systems reviewed and are negative.    Allergies  Atorvastatin and Penicillins  Home Medications   Current Outpatient Rx  Name Route Sig Dispense Refill  . AZITHROMYCIN 250 MG PO TABS  Take as  directed 6 each 0  . VITAMIN D 1000 UNITS PO TABS Oral Take 1,000 Units by mouth daily.      Marland Kitchen CLORAZEPATE DIPOTASSIUM 7.5 MG PO TABS  1/2-1 tablet by mouth 3 times a day as needed for nerves--not to exceed 3 per day 90 tablet 3  . COQ10 100 MG PO CAPS Oral Take 1 capsule by mouth daily.      Marland Kitchen ESOMEPRAZOLE MAGNESIUM 40 MG PO CPDR Oral Take 1 capsule (40 mg total) by mouth daily before breakfast. 90 capsule 2  . GARLIC 400 MG PO TABS Oral Take 1 tablet by mouth daily.      Marland Kitchen MAGNESIUM 250 MG PO TABS Oral Take 1 tablet by mouth daily.      Marland Kitchen SIMVASTATIN 20 MG PO TABS Oral Take 20 mg by mouth at bedtime. Takes prn    . VITAMIN C 500 MG PO TABS Oral Take 500 mg by mouth daily.      Marland Kitchen VITAMIN E 400 UNITS PO CAPS Oral Take 400 Units by mouth daily.        There were no vitals taken for this visit.  Physical Exam  Nursing note and vitals reviewed.  65 old female is resting comfortably and in no acute distress. Vital signs are normal. Oxygen saturation is 96% which is normal. Head is normocephalic and atraumatic.  PERRLA, EOMI. There is no sinus tenderness. Examination is without lesions no swollen turbinates no purulent drainage. Oropharynx is clear. Neck is nontender and supple without adenopathy or JVD. Lungs are clear without rales, wheezes, rhonchi. Heart has regular rate and rhythm without murmur. Abdomen is soft, flat, nontender without masses or hepatosplenomegaly and peristalsis present. Extremities have no cyanosis or edema, full range of motion is present. Skin is warm and dry without rash. Neurologic: Mental status is normal, cranial nerves are intact, no focal motor or sensory deficits.  ED Course  Procedures (including critical care time)  Results for orders placed during the hospital encounter of 05/15/11  CBC      Component Value Range   WBC 10.2  4.0 - 10.5 (K/uL)   RBC 4.48  3.87 - 5.11 (MIL/uL)   Hemoglobin 12.9  12.0 - 15.0 (g/dL)   HCT 40.9  81.1 - 91.4 (%)   MCV 84.2  78.0 -  100.0 (fL)   MCH 28.8  26.0 - 34.0 (pg)   MCHC 34.2  30.0 - 36.0 (g/dL)   RDW 78.2  95.6 - 21.3 (%)   Platelets 259  150 - 400 (K/uL)  DIFFERENTIAL      Component Value Range   Neutrophils Relative 87 (*) 43 - 77 (%)   Neutro Abs 8.9 (*) 1.7 - 7.7 (K/uL)   Lymphocytes Relative 9 (*) 12 - 46 (%)   Lymphs Abs 0.9  0.7 - 4.0 (K/uL)   Monocytes Relative 3  3 - 12 (%)   Monocytes Absolute 0.4  0.1 - 1.0 (K/uL)   Eosinophils Relative 0  0 - 5 (%)   Eosinophils Absolute 0.0  0.0 - 0.7 (K/uL)   Basophils Relative 0  0 - 1 (%)   Basophils Absolute 0.0  0.0 - 0.1 (K/uL)  BASIC METABOLIC PANEL      Component Value Range   Sodium 135  135 - 145 (mEq/L)   Potassium 4.0  3.5 - 5.1 (mEq/L)   Chloride 102  96 - 112 (mEq/L)   CO2 24  19 - 32 (mEq/L)   Glucose, Bld 119 (*) 70 - 99 (mg/dL)   BUN 15  6 - 23 (mg/dL)   Creatinine, Ser 0.86  0.50 - 1.10 (mg/dL)   Calcium 9.5  8.4 - 57.8 (mg/dL)   GFR calc non Af Amer 83 (*) >90 (mL/min)   GFR calc Af Amer >90  >90 (mL/min)  URINALYSIS, ROUTINE W REFLEX MICROSCOPIC      Component Value Range   Color, Urine YELLOW  YELLOW    APPearance CLEAR  CLEAR    Specific Gravity, Urine 1.019  1.005 - 1.030    pH 6.5  5.0 - 8.0    Glucose, UA NEGATIVE  NEGATIVE (mg/dL)   Hgb urine dipstick NEGATIVE  NEGATIVE    Bilirubin Urine NEGATIVE  NEGATIVE    Ketones, ur NEGATIVE  NEGATIVE (mg/dL)   Protein, ur NEGATIVE  NEGATIVE (mg/dL)   Urobilinogen, UA 0.2  0.0 - 1.0 (mg/dL)   Nitrite NEGATIVE  NEGATIVE    Leukocytes, UA TRACE (*) NEGATIVE   URINE MICROSCOPIC-ADD ON      Component Value Range   Squamous Epithelial / LPF RARE  RARE    WBC, UA 0-2  <3 (WBC/hpf)   RBC / HPF 0-2  <3 (RBC/hpf)   Bacteria, UA RARE  RARE      0750: She feels much better after IV Zofran. She will be given a trial of  oral fluids.  She has tolerated oral fluids well. She'll be sent home with a prescription for metoclopramide instructions discontinue azithromycin.  1. Vomiting and  diarrhea       MDM  Diarrhea and vomiting which may be side effect of Zithromax in, but may also be to a viral illness. With diarrhea preceding vomiting and occurring very soon after starting azithromycin, I feel that a viral illness is more likely. She will be treated symptomatically with IV fluids and Zofran.        Dione Booze, MD 05/15/11 1302

## 2011-05-16 NOTE — Telephone Encounter (Signed)
Spoke with pt. She states that she went to ED yesterday for nausea and vomiting. Pt states they advised she probably had norovirus, but to stop zpack just in case this was the cause. She was taken off of the zpack that we prescribed for her sinus infection, and was told to let SN know this. She states she feels that her sinus infection has cleared, and there is nothing further needed. Just an FYI. Will forward to SN.

## 2011-05-28 DIAGNOSIS — D485 Neoplasm of uncertain behavior of skin: Secondary | ICD-10-CM | POA: Diagnosis not present

## 2011-05-28 DIAGNOSIS — L723 Sebaceous cyst: Secondary | ICD-10-CM | POA: Diagnosis not present

## 2011-05-28 DIAGNOSIS — D239 Other benign neoplasm of skin, unspecified: Secondary | ICD-10-CM | POA: Diagnosis not present

## 2011-07-09 DIAGNOSIS — D239 Other benign neoplasm of skin, unspecified: Secondary | ICD-10-CM | POA: Diagnosis not present

## 2011-07-15 DIAGNOSIS — H4011X0 Primary open-angle glaucoma, stage unspecified: Secondary | ICD-10-CM | POA: Diagnosis not present

## 2011-08-11 ENCOUNTER — Telehealth: Payer: Self-pay | Admitting: Pulmonary Disease

## 2011-08-11 MED ORDER — SIMVASTATIN 20 MG PO TABS: 20.0000 mg | ORAL_TABLET | Freq: Every evening | ORAL | Status: DC

## 2011-08-11 NOTE — Telephone Encounter (Signed)
RX has been sent and pt needed nothing further

## 2011-08-19 DIAGNOSIS — H40009 Preglaucoma, unspecified, unspecified eye: Secondary | ICD-10-CM | POA: Diagnosis not present

## 2011-08-28 ENCOUNTER — Other Ambulatory Visit: Payer: Self-pay | Admitting: Pulmonary Disease

## 2011-08-28 DIAGNOSIS — Z1231 Encounter for screening mammogram for malignant neoplasm of breast: Secondary | ICD-10-CM

## 2011-09-17 ENCOUNTER — Other Ambulatory Visit: Payer: Self-pay | Admitting: *Deleted

## 2011-09-17 MED ORDER — CLORAZEPATE DIPOTASSIUM 7.5 MG PO TABS: ORAL_TABLET | ORAL | Status: AC

## 2011-09-17 NOTE — Telephone Encounter (Signed)
Received faxed refill request from [facility] [address] for clorazepate 7.5mg  take 1/2-1 tablet tid prn for nerves (max 3 a day).    Last OV 01/21/2011 - was asked to f/u in 1 yr No pending OV  Clorazepate rx last given on 03/25/11 # 90 x 3.    Dr. Kriste Basque, pls advise if ok for rx.  Thank you!

## 2011-09-17 NOTE — Telephone Encounter (Signed)
rx for the clorazepate has been called to the pharmacy and nothing further needed.

## 2011-09-25 ENCOUNTER — Ambulatory Visit
Admission: RE | Admit: 2011-09-25 | Discharge: 2011-09-25 | Disposition: A | Discharge status: Home / Self Care | Payer: Medicare Other | Source: Ambulatory Visit | Attending: Pulmonary Disease | Admitting: Pulmonary Disease

## 2011-09-25 DIAGNOSIS — Z1231 Encounter for screening mammogram for malignant neoplasm of breast: Secondary | ICD-10-CM

## 2011-11-04 DIAGNOSIS — Z1289 Encounter for screening for malignant neoplasm of other sites: Secondary | ICD-10-CM | POA: Diagnosis not present

## 2011-11-14 ENCOUNTER — Other Ambulatory Visit: Payer: Self-pay | Admitting: Pulmonary Disease

## 2011-12-04 DIAGNOSIS — M25549 Pain in joints of unspecified hand: Secondary | ICD-10-CM | POA: Diagnosis not present

## 2011-12-04 DIAGNOSIS — M19049 Primary osteoarthritis, unspecified hand: Secondary | ICD-10-CM | POA: Diagnosis not present

## 2011-12-09 DIAGNOSIS — H4011X0 Primary open-angle glaucoma, stage unspecified: Secondary | ICD-10-CM | POA: Diagnosis not present

## 2012-01-23 ENCOUNTER — Telehealth: Payer: Self-pay | Admitting: Pulmonary Disease

## 2012-01-23 NOTE — Telephone Encounter (Signed)
Spoke with pt. She is concerned b/c she has 2 fever blisters on upper and lower lip Taking OTC med with some improvement She is concerned b/c she read that acid reflux can cause this, or having a lowered immune system She asked many questions regarding the cause and tx that I could not answer She is over due appt, and so I set up up to see TP on Monday at 12 noon If she gets better she will cancel

## 2012-01-26 ENCOUNTER — Ambulatory Visit: Payer: Medicare Other | Admitting: Adult Health

## 2012-01-26 DIAGNOSIS — T148XXA Other injury of unspecified body region, initial encounter: Secondary | ICD-10-CM | POA: Diagnosis not present

## 2012-01-26 DIAGNOSIS — S0990XA Unspecified injury of head, initial encounter: Secondary | ICD-10-CM | POA: Diagnosis not present

## 2012-03-15 ENCOUNTER — Other Ambulatory Visit: Payer: Self-pay | Admitting: Pulmonary Disease

## 2012-03-25 DIAGNOSIS — H4011X0 Primary open-angle glaucoma, stage unspecified: Secondary | ICD-10-CM | POA: Diagnosis not present

## 2012-03-31 ENCOUNTER — Telehealth: Payer: Self-pay | Admitting: Pulmonary Disease

## 2012-03-31 NOTE — Telephone Encounter (Signed)
Spoke with patient-states she got flu shot at [facility] on Thursday 03-26-11;started having dizzy and lightheaded feelings today; denies any body aches, blurred vision, swelling at injection site, redness at injection site, fevers, nausea or vomiting. Pt did note slight itching at the site. I explained to patient the this most likely is not coming from the flu shot as its been about 1 week since given to her. Pt was told to increase her fluid intake and in the meantime would send to SN for any further recommendations.

## 2012-04-01 NOTE — Telephone Encounter (Addendum)
Per SN---nothing to do for this.  She will need to rest at home, increase fluids, tylenol as needed and give this some time to pass. i called and spoke with pt and she stated that she is now feeling  Much better and more like herself today.  Nothing further is needed.

## 2012-04-12 ENCOUNTER — Telehealth: Payer: Self-pay | Admitting: Internal Medicine

## 2012-04-12 NOTE — Telephone Encounter (Signed)
This is the daughter of one of your patients and she is hoping to also be accepted as new patient, please advise

## 2012-04-13 NOTE — Telephone Encounter (Signed)
OK, I guess. Who is her Mom?

## 2012-04-23 NOTE — Telephone Encounter (Signed)
Left message for patient to call back and schedule.

## 2012-05-12 ENCOUNTER — Encounter: Payer: Self-pay | Admitting: Internal Medicine

## 2012-05-12 ENCOUNTER — Ambulatory Visit (INDEPENDENT_AMBULATORY_CARE_PROVIDER_SITE_OTHER): Payer: Medicare Other | Admitting: Internal Medicine

## 2012-05-12 VITALS — BP 110/70 | Temp 98.2°F | Ht 64.0 in | Wt 149.0 lb

## 2012-05-12 DIAGNOSIS — H409 Unspecified glaucoma: Secondary | ICD-10-CM | POA: Diagnosis not present

## 2012-05-12 DIAGNOSIS — E7800 Pure hypercholesterolemia, unspecified: Secondary | ICD-10-CM | POA: Diagnosis not present

## 2012-05-12 DIAGNOSIS — K219 Gastro-esophageal reflux disease without esophagitis: Secondary | ICD-10-CM | POA: Diagnosis not present

## 2012-05-12 NOTE — Progress Notes (Signed)
Subjective:    Patient ID: Hannah [place], female    DOB: 1934/10/22, 78 y.o.   MRN: [id number]  HPI Hannah Madden presents with the intention of establishing for primary care. She has been followed in the past by Dr. Kriste Basque. Her mother had been my patient for several years and recently passed at 6 in Dec '14.   Past Medical History  Diagnosis Date  . Allergy history unknown   . GERD (gastroesophageal reflux disease)   . Alopecia     stress related and resolved since her Mother moved to SNF  . Anxiety     very situational related to care-taker role.  . Glaucoma(365)   . Hypercholesteremia     life-style management   Past Surgical History  Procedure Laterality Date  . Abdominal hysterectomy      fibroids.   Family History  Problem Relation Age of Onset  . COPD Neg Hx   . Diabetes Neg Hx   . Heart disease Neg Hx   . Stroke Neg Hx    History   Social History  . Marital Status: Married    Spouse Name: N/A    Number of Children: 4  . Years of Education: 16+   Occupational History  . financial Dentist, now emeritus   Social History Main Topics  . Smoking status: Never Smoker   . Smokeless tobacco: Never Used     Comment: tried to smoke in [place]  . Alcohol Use: No  . Drug Use: No  . Sexually Active: Yes -- Female partner(s)   Other Topics Concern  . Not on file   Social History Narrative   HSG, Hannah Madden, Hannah Madden, Hannah Madden - no degree. Married '56. 2 sons - '64, '71; 2 dtrs - '57, '59; 6 grandchildren. 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 She goes to the gym regularly, bowls, takes classes. ACP - discussed the "gift of Love" of ACP.    Current Outpatient Prescriptions on File Prior to Visit  Medication Sig Dispense Refill  . calcium carbonate (OS-CAL) 600 MG TABS Take 600 mg by mouth daily.      . cholecalciferol (VITAMIN D) 1000 UNITS tablet  Take 1,000 Units by mouth daily.        . clorazepate (TRANXENE) 7.5 MG tablet 1/2-1 tablet by mouth 3 times a day as needed for nerves--not to exceed 3 per day  90 tablet  5  . Coenzyme Q10 (COQ10) 100 MG CAPS Take 1 capsule by mouth daily.        . Garlic 400 MG TABS Take 1 tablet by mouth daily.        Marland Kitchen guaiFENesin (MUCINEX) 600 MG 12 hr tablet Take 600 mg by mouth 2 (two) times daily. For congestion      . Magnesium 250 MG TABS Take 1 tablet by mouth daily.        Marland Kitchen NEXIUM 40 MG capsule TAKE 1 CAPSULE BY MOUTH DAILY BEFORE BREAKFAST  90 each  1  . vitamin C (ASCORBIC ACID) 500 MG tablet Take 500 mg by mouth daily.        . vitamin E 400 UNIT capsule Take 400 Units by mouth daily.        . simvastatin (ZOCOR) 20 MG tablet Take 1 tablet (20 mg total) by mouth every evening. As needed  30 tablet  3   No current facility-administered medications on  file prior to visit.      Review of Systems Constitutional:  Negative for fever, chills, activity change and unexpected weight change.  HEENT:  Negative for hearing loss, ear pain, congestion, neck stiffness and postnasal drip. Negative for sore throat or swallowing problems. Positive for dental repair with bridge problems due to dental infection.   Eyes: Negative for vision loss or change in visual acuity.  Respiratory: Negative for chest tightness and wheezing. Negative for DOE.   Cardiovascular: Negative for chest pain or palpitations. No decreased exercise tolerance Gastrointestinal: No change in bowel habit. No bloating or gas. No reflux or indigestion Genitourinary: Negative for urgency, frequency, flank pain and difficulty urinating.  Musculoskeletal: Negative for myalgias, back pain, arthralgias and gait problem.  Neurological: Negative for dizziness, tremors, weakness and headaches.  Hematological: Negative for adenopathy.  Psychiatric/Behavioral: Negative for behavioral problems and dysphoric mood.       Objective:   Physical  Exam Filed Vitals:   05/12/12 1001  BP: 110/70  Temp: 98.2 F (36.8 C)   Wt Readings from Last 3 Encounters:  05/12/12 149 lb (67.586 kg)  01/21/11 151 lb 3.2 oz (68.584 kg)  12/14/09 148 lb 6.1 oz (67.305 kg)   Gen'l- WNWD nicely groomed AA woman who looks younger than her stated age HEENT - C&S clear, PERRLA Cor- 2+ radial pulse, RRR Pulm - normal respirations Neuro - A&O x 3, speech clear, cognition/memory normal, normal gait       Assessment & Plan:

## 2012-05-12 NOTE — Patient Instructions (Addendum)
Thank you for coming to see me.  Please schedule an annual medicare wellness exam for June or July.  Advance Care Planning - a Gift of Love to our families - please check out [email or web address]."  Please sign up for MyChart

## 2012-05-13 NOTE — Assessment & Plan Note (Signed)
Last LDL 1 year ago was controlled - better than goal of 130 or less.  Plan Continue present regimen  F/u lab at wellness exam

## 2012-05-13 NOTE — Assessment & Plan Note (Signed)
Closely monitored by Dr. [place], OD

## 2012-05-13 NOTE — Assessment & Plan Note (Signed)
Very stable and symptoms controlled on PPI therapy

## 2012-05-24 DIAGNOSIS — H4011X0 Primary open-angle glaucoma, stage unspecified: Secondary | ICD-10-CM | POA: Diagnosis not present

## 2012-06-28 DIAGNOSIS — H4011X0 Primary open-angle glaucoma, stage unspecified: Secondary | ICD-10-CM | POA: Diagnosis not present

## 2012-07-07 ENCOUNTER — Telehealth: Payer: Self-pay | Admitting: *Deleted

## 2012-07-07 ENCOUNTER — Other Ambulatory Visit: Payer: Self-pay | Admitting: Internal Medicine

## 2012-07-07 MED ORDER — ESOMEPRAZOLE MAGNESIUM 40 MG PO CPDR: DELAYED_RELEASE_CAPSULE | ORAL | Status: DC

## 2012-07-07 NOTE — Telephone Encounter (Signed)
Pt informed via VM rx sent to [facility] and local [facility] [address] and to callback office with any questions/concerns.

## 2012-07-07 NOTE — Telephone Encounter (Signed)
Pt requesting refill of Nexium be sent to [facility]. Rx sent sent, called pt to inform rx sent to [facility] and also to local pharmacy this AM, no answer/unable to leave message.

## 2012-07-07 NOTE — Telephone Encounter (Signed)
Called pt no answer unable to leave message

## 2012-08-16 ENCOUNTER — Encounter: Payer: Medicare Other | Admitting: Internal Medicine

## 2012-08-24 ENCOUNTER — Other Ambulatory Visit (INDEPENDENT_AMBULATORY_CARE_PROVIDER_SITE_OTHER): Payer: Medicare Other

## 2012-08-24 ENCOUNTER — Ambulatory Visit (INDEPENDENT_AMBULATORY_CARE_PROVIDER_SITE_OTHER): Payer: Medicare Other | Admitting: Internal Medicine

## 2012-08-24 ENCOUNTER — Encounter: Payer: Self-pay | Admitting: Internal Medicine

## 2012-08-24 VITALS — BP 136/70 | HR 64 | Temp 97.2°F | Resp 12 | Ht 64.0 in | Wt 144.8 lb

## 2012-08-24 DIAGNOSIS — H409 Unspecified glaucoma: Secondary | ICD-10-CM

## 2012-08-24 DIAGNOSIS — K219 Gastro-esophageal reflux disease without esophagitis: Secondary | ICD-10-CM | POA: Diagnosis not present

## 2012-08-24 DIAGNOSIS — E7800 Pure hypercholesterolemia, unspecified: Secondary | ICD-10-CM

## 2012-08-24 DIAGNOSIS — Z0001 Encounter for general adult medical examination with abnormal findings: Secondary | ICD-10-CM | POA: Insufficient documentation

## 2012-08-24 DIAGNOSIS — M1990 Unspecified osteoarthritis, unspecified site: Secondary | ICD-10-CM | POA: Diagnosis not present

## 2012-08-24 DIAGNOSIS — Z0000 Encounter for general adult medical examination without abnormal findings: Secondary | ICD-10-CM

## 2012-08-24 LAB — COMPREHENSIVE METABOLIC PANEL
ALT: 19 U/L (ref 0–35)
Alkaline Phosphatase: 80 U/L (ref 39–117)
CO2: 26 mEq/L (ref 19–32)
Creatinine, Ser: 0.8 mg/dL (ref 0.4–1.2)
GFR: 95.24 mL/min (ref >60.00)
Glucose, Bld: 110 mg/dL — ABNORMAL HIGH (ref 70–99)
Total Bilirubin: 0.6 mg/dL (ref 0.3–1.2)

## 2012-08-24 LAB — CBC WITH DIFFERENTIAL/PLATELET
Basophils Absolute: 0 10*3/uL (ref 0.0–0.1)
Eosinophils Absolute: 0.1 10*3/uL (ref 0.0–0.7)
MCHC: 33.9 g/dL (ref 30.0–36.0)
MCV: 87.2 fl (ref 78.0–100.0)
Monocytes Absolute: 0.4 10*3/uL (ref 0.1–1.0)
Neutrophils Relative %: 47.4 % (ref 43.0–77.0)
Platelets: 218 10*3/uL (ref 150.0–400.0)

## 2012-08-24 LAB — LIPID PANEL
Cholesterol: 163 mg/dL (ref 0–200)
HDL: 48.8 mg/dL (ref >39.00)
LDL Cholesterol: 93 mg/dL (ref 0–99)
VLDL: 21 mg/dL (ref 0.0–40.0)

## 2012-08-24 LAB — HEPATIC FUNCTION PANEL
ALT: 19 U/L (ref 0–35)
AST: 22 U/L (ref 0–37)
Albumin: 4.4 g/dL (ref 3.5–5.2)
Alkaline Phosphatase: 80 U/L (ref 39–117)

## 2012-08-24 NOTE — Patient Instructions (Addendum)
Thanks for coming to see me. Everything looks fine. I am very proud of your discipline in taking care of yourself.  Please sign up for MyChart  Labs are ordered today: results will be on MyChart and in the note I send you.  Advanced Care Planning is important. Please check the web site [email or web address] for more information about this.  If all goes well I hope to see you in 6 months for a mid-year quick follow up.

## 2012-08-24 NOTE — Progress Notes (Signed)
Subjective:    Patient ID: Hannah Madden, female    DOB: 12/29/1934, 78 y.o.   MRN: [id number]  HPI The patient is here for annual Medicare wellness examination and management of other chronic and acute problems. She was last seen in March - note reviewed. She has been doing well.    The risk factors are reflected in the social history.  The roster of all physicians providing medical care to patient - is listed in the Snapshot section of the chart.  Activities of daily living:  The patient is 100% inedpendent in all ADLs: dressing, toileting, feeding as well as independent mobility  Home safety : The patient has smoke detectors in the home. Falls - none in the last 12 months. Home is fall safe except for grab bars. They wear seatbelts. firearms are present in the home, kept in a safe fashion. There is no violence in the home.   There is no risks for hepatitis, STDs or HIV. There is no history of blood transfusion. They have no travel history to infectious disease endemic areas of the world.  The patient has seen their dentist in the last six month. They have seen their eye doctor in the last year - glaucoma management. They deny any hearing difficulty and have not had audiologic testing in the last year.   They do not  have excessive sun exposure. Discussed the need for sun protection: hats, long sleeves and use of sunscreen if there is significant sun exposure.   Diet: the importance of a healthy diet is discussed. They do have a healthy (unhealthy-high fat/fast food) diet.  The patient has a regular exercise program: cardio and training ,  60 min duration, 5 per week.  The benefits of regular aerobic exercise were discussed.  Depression screen: there are no signs or vegative symptoms of depression- irritability, change in appetite, anhedonia, sadness/tearfullness.  Cognitive assessment: the patient manages all their financial and personal affairs and is actively engaged.   The  following portions of the patient's history were reviewed and updated as appropriate: allergies, current medications, past family history, past medical history,  past surgical history, past social history  and problem list.  Vision, hearing, body mass index were assessed and reviewed.   During the course of the visit the patient was educated and counseled about appropriate screening and preventive services including : fall prevention , diabetes screening, nutrition counseling, colorectal cancer screening, and recommended immunizations.  Past Medical History  Diagnosis Date  . Allergy history unknown   . GERD (gastroesophageal reflux disease)   . Alopecia     stress related and resolved since her Mother moved to SNF  . Anxiety     very situational related to care-taker role.  . Glaucoma   . Hypercholesteremia     life-style management   Past Surgical History  Procedure Laterality Date  . Abdominal hysterectomy      fibroids.   Family History  Problem Relation Age of Onset  . COPD Neg Hx   . Diabetes Neg Hx   . Heart disease Neg Hx   . Stroke Neg Hx    History   Social History  . Marital Status: Married    Spouse Name: N/A    Number of Children: 4  . Years of Education: 16+   Occupational History  . financial Dentist, now emeritus   Social History Main Topics  . Smoking status: Never Smoker   . Smokeless  tobacco: Never Used     Comment: tried to smoke in college  . Alcohol Use: No  . Drug Use: No  . Sexually Active: Yes -- Female partner(s)   Other Topics Concern  . Not on file   Social History Narrative   HSG, [employer], [employer], [employer] - no degree. Married '56. 2 sons - '64, '71; 2 dtrs - '57, '59; 6 grandchildren. 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 She goes to the gym regularly, bowls, takes classes. ACP - discussed the "gift of Love" of ACP.     Current Outpatient Prescriptions on File Prior to Visit  Medication Sig Dispense Refill  . calcium carbonate (OS-CAL) 600 MG TABS Take 600 mg by mouth daily.      . cholecalciferol (VITAMIN D) 1000 UNITS tablet Take 1,000 Units by mouth daily.        . clorazepate (TRANXENE) 7.5 MG tablet 1/2-1 tablet by mouth 3 times a day as needed for nerves--not to exceed 3 per day  90 tablet  5  . Coenzyme Q10 (COQ10) 100 MG CAPS Take 1 capsule by mouth daily.        Marland Kitchen esomeprazole (NEXIUM) 40 MG capsule TAKE ONE CAPSULE BY MOUTH EVERY DAY  90 capsule  1  . Garlic 400 MG TABS Take 1 tablet by mouth daily.        Marland Kitchen guaiFENesin (MUCINEX) 600 MG 12 hr tablet Take 600 mg by mouth 2 (two) times daily. For congestion      . Magnesium 250 MG TABS Take 1 tablet by mouth daily.        . vitamin C (ASCORBIC ACID) 500 MG tablet Take 500 mg by mouth daily.        . vitamin E 400 UNIT capsule Take 400 Units by mouth daily.        . simvastatin (ZOCOR) 20 MG tablet Take 1 tablet (20 mg total) by mouth every evening. As needed  30 tablet  3   No current facility-administered medications on file prior to visit.      Review of Systems Constitutional:  Negative for fever, chills, activity change and unexpected weight change.  HEENT:  Negative for hearing loss, ear pain, congestion, neck stiffness and postnasal drip. Negative for sore throat or swallowing problems. Negative for dental complaints but had major work and extractions requiring an upper denture.   Eyes: Negative for vision loss or change in visual acuity.  Respiratory: Negative for chest tightness and wheezing. Negative for DOE.   Cardiovascular: Negative for chest pain or palpitations. No decreased exercise tolerance Gastrointestinal: No change in bowel habit. No bloating or gas. No reflux or indigestion Genitourinary: Negative for urgency, frequency, flank pain and difficulty urinating.  Musculoskeletal: Negative for myalgias, back pain, arthralgias  and gait problem.  Neurological: Negative for dizziness, tremors, weakness and headaches.  Hematological: Negative for adenopathy.  Psychiatric/Behavioral: Negative for behavioral problems and dysphoric mood.       Objective:   Physical Exam Filed Vitals:   08/24/12 0859  BP: 136/70  Pulse: 64  Temp: 97.2 F (36.2 C)  Resp: 12   Wt Readings from Last 3 Encounters:  08/24/12 144 lb 12.8 oz (65.681 kg)  05/12/12 149 lb (67.586 kg)  01/21/11 151 lb 3.2 oz (68.584 kg)   Gen'l: well nourished, well developed AA Woman in no distress HEENT - [place]/AT, EACs/TMs normal, oropharynx with full upper denture, lower partial,  no buccal lesions, posterior pharynx clear, mucous membranes moist. C&S clear, PERRLA, fundi - normal Neck - supple, no thyromegaly Nodes- negative submental, cervical, supraclavicular regions Chest - no deformity, no CVAT Lungs - clear without rales, wheezes. No increased work of breathing Breast - deferred to gyn Cardiovascular - regular rate and rhythm, quiet precordium, no murmurs, rubs or gallops, 2+ radial, DP and PT pulses Abdomen - BS+ x 4, no HSM, no guarding or rebound or tenderness Pelvic - deferred to gyn Rectal - deferred to GI Extremities - no clubbing, cyanosis, edema or deformity.  Neuro - A&O x 3, CN II-XII normal, motor strength normal and equal, DTRs 2+ and symmetrical biceps, radial, and patellar tendons. Cerebellar - no tremor, no rigidity, fluid movement and normal gait. Derm - Head, neck, back, abdomen and extremities without suspicious lesions  Recent Results (from the past 2160 hour(s))  HEPATIC FUNCTION PANEL     Status: None   Collection Time    08/24/12  9:54 AM      Result Value Range   Total Bilirubin 0.6  0.3 - 1.2 mg/dL   Bilirubin, Direct 0.1  0.0 - 0.3 mg/dL   Alkaline Phosphatase 80  39 - 117 U/L   AST 22  0 - 37 U/L   ALT 19  0 - 35 U/L   Total Protein 7.3  6.0 - 8.3 g/dL   Albumin 4.4  3.5 - 5.2 g/dL  COMPREHENSIVE METABOLIC  PANEL     Status: Abnormal   Collection Time    08/24/12  9:54 AM      Result Value Range   Sodium 138  135 - 145 mEq/L   Potassium 4.4  3.5 - 5.1 mEq/L   Chloride 104  96 - 112 mEq/L   CO2 26  19 - 32 mEq/L   Glucose, Bld 110 (*) 70 - 99 mg/dL   BUN 9  6 - 23 mg/dL   Creatinine, Ser 0.8  0.4 - 1.2 mg/dL   Total Bilirubin 0.6  0.3 - 1.2 mg/dL   Alkaline Phosphatase 80  39 - 117 U/L   AST 22  0 - 37 U/L   ALT 19  0 - 35 U/L   Total Protein 7.3  6.0 - 8.3 g/dL   Albumin 4.4  3.5 - 5.2 g/dL   Calcium 9.3  8.4 - 16.1 mg/dL   GFR 09.60  >45.40 mL/min  LIPID PANEL     Status: None   Collection Time    08/24/12  9:54 AM      Result Value Range   Cholesterol 163  0 - 200 mg/dL   Comment: ATP III Classification       Desirable:  < 200 mg/dL               Borderline High:  200 - 239 mg/dL          High:  > = 981 mg/dL   Triglycerides 191.4  0.0 - 149.0 mg/dL   Comment: Normal:  <782 mg/dLBorderline High:  150 - 199 mg/dL   HDL 95.62  >13.08 mg/dL   VLDL 65.7  0.0 - 84.6 mg/dL   LDL Cholesterol 93  0 - 99 mg/dL   Total CHOL/HDL Ratio 3     Comment:                Men          Women1/2 Average Risk     3.4  3.3Average Risk          5.0          4.42X Average Risk          9.6          7.13X Average Risk          15.0          11.0                      CBC WITH DIFFERENTIAL     Status: None   Collection Time    08/24/12  9:54 AM      Result Value Range   WBC 5.2  4.5 - 10.5 K/uL   RBC 4.13  3.87 - 5.11 Mil/uL   Hemoglobin 12.2  12.0 - 15.0 g/dL   HCT 40.9  81.1 - 91.4 %   MCV 87.2  78.0 - 100.0 fl   MCHC 33.9  30.0 - 36.0 g/dL   RDW 78.2  95.6 - 21.3 %   Platelets 218.0  150.0 - 400.0 K/uL   Neutrophils Relative % 47.4  43.0 - 77.0 %   Lymphocytes Relative 42.7  12.0 - 46.0 %   Monocytes Relative 8.1  3.0 - 12.0 %   Eosinophils Relative 1.6  0.0 - 5.0 %   Basophils Relative 0.2  0.0 - 3.0 %   Neutro Abs 2.5  1.4 - 7.7 K/uL   Lymphs Abs 2.2  0.7 - 4.0 K/uL   Monocytes  Absolute 0.4  0.1 - 1.0 K/uL   Eosinophils Absolute 0.1  0.0 - 0.7 K/uL   Basophils Absolute 0.0  0.0 - 0.1 K/uL         Assessment & Plan:

## 2012-08-24 NOTE — Assessment & Plan Note (Signed)
Interval medical history is unremarkable. She does have some regret about neglecting her gums leading to extraction and denture. Physical exam is normal sans breast exam. Lab results are in normal range. She is current with colorectal cancer screening and breast cancer screening. She has been offered and declined pneumonia and shingles vaccines.   In summary  A very pleasant, health conscious woman who takes good care of herself and is medically stable. She will return in 1 year or sooner as needed.

## 2012-08-24 NOTE — Assessment & Plan Note (Signed)
Stable with very well controlled symptoms. She has had no adverse effects from PPI therapy (Nexium)

## 2012-08-24 NOTE — Assessment & Plan Note (Signed)
Closely monitored by her ophthalmologist. She reports her intra-ocular pressures are at goal.

## 2012-08-24 NOTE — Assessment & Plan Note (Signed)
Usually takes her Zocor but has been off for a few days. She tolerates the medication well. Lab reveals good control with LDL better than goal of 130 or less and HDL better than goal of 40+. Liver functions are normal  Plan Good control at this time  A drug "Holiday" is reasonable should she choose this option: stop medication for 3 weeks and then recheck lipid panel to reassess baseline levels.

## 2012-08-24 NOTE — Assessment & Plan Note (Signed)
No activity limiting joint disease. She is very active going to the gym 5 days a week.

## 2012-09-23 DIAGNOSIS — H4011X0 Primary open-angle glaucoma, stage unspecified: Secondary | ICD-10-CM | POA: Diagnosis not present

## 2012-10-07 ENCOUNTER — Other Ambulatory Visit: Payer: Self-pay

## 2012-10-07 ENCOUNTER — Telehealth: Payer: Self-pay | Admitting: Internal Medicine

## 2012-10-07 ENCOUNTER — Other Ambulatory Visit: Payer: Self-pay | Admitting: Internal Medicine

## 2012-10-07 DIAGNOSIS — Z1231 Encounter for screening mammogram for malignant neoplasm of breast: Secondary | ICD-10-CM

## 2012-10-07 DIAGNOSIS — H4011X0 Primary open-angle glaucoma, stage unspecified: Secondary | ICD-10-CM | POA: Diagnosis not present

## 2012-10-07 MED ORDER — SIMVASTATIN 20 MG PO TABS: 20.0000 mg | ORAL_TABLET | Freq: Every evening | ORAL | Status: DC

## 2012-10-07 NOTE — Telephone Encounter (Signed)
Walk in sheet: Pt's eye doctor is sending her to a Retina Specialist (Dr. Stephannie Li) on Aug 27.  She is concerned because she was told she had a vein occlusion in her eyes.  She needs to know if she needs to do anything here.    She also wants to start back on her statin drug.  [facility] [address].     She feels good but has concerns.

## 2012-10-07 NOTE — Telephone Encounter (Signed)
1. In regard to referral to retinal specialist - nothing to be done here in advance of seeing Dr. Allyne Gee  2. OK to restart statin. Order sent to pharmacy

## 2012-10-07 NOTE — Telephone Encounter (Signed)
LMOM to call.

## 2012-10-12 NOTE — Telephone Encounter (Signed)
Pt is aware.  

## 2012-10-25 ENCOUNTER — Ambulatory Visit: Payer: Medicare Other

## 2012-10-27 DIAGNOSIS — H34239 Retinal artery branch occlusion, unspecified eye: Secondary | ICD-10-CM | POA: Diagnosis not present

## 2012-10-27 DIAGNOSIS — H31019 Macula scars of posterior pole (postinflammatory) (post-traumatic), unspecified eye: Secondary | ICD-10-CM | POA: Diagnosis not present

## 2012-10-27 DIAGNOSIS — H3581 Retinal edema: Secondary | ICD-10-CM | POA: Diagnosis not present

## 2012-10-27 DIAGNOSIS — H4011X0 Primary open-angle glaucoma, stage unspecified: Secondary | ICD-10-CM | POA: Diagnosis not present

## 2012-11-08 ENCOUNTER — Ambulatory Visit
Admission: RE | Admit: 2012-11-08 | Discharge: 2012-11-08 | Disposition: A | Discharge status: Home / Self Care | Payer: Medicare Other | Source: Ambulatory Visit

## 2012-11-08 DIAGNOSIS — Z1231 Encounter for screening mammogram for malignant neoplasm of breast: Secondary | ICD-10-CM | POA: Diagnosis not present

## 2012-11-16 DIAGNOSIS — L819 Disorder of pigmentation, unspecified: Secondary | ICD-10-CM | POA: Diagnosis not present

## 2012-11-16 DIAGNOSIS — D485 Neoplasm of uncertain behavior of skin: Secondary | ICD-10-CM | POA: Diagnosis not present

## 2012-11-23 ENCOUNTER — Telehealth: Payer: Self-pay | Admitting: Internal Medicine

## 2012-11-23 DIAGNOSIS — M1710 Unilateral primary osteoarthritis, unspecified knee: Secondary | ICD-10-CM | POA: Diagnosis not present

## 2012-11-23 DIAGNOSIS — M23329 Other meniscus derangements, posterior horn of medial meniscus, unspecified knee: Secondary | ICD-10-CM | POA: Diagnosis not present

## 2012-11-23 MED ORDER — CLORAZEPATE DIPOTASSIUM 7.5 MG PO TABS: 7.5000 mg | ORAL_TABLET | Freq: Three times a day (TID) | ORAL | Status: DC

## 2012-11-23 MED ORDER — ESOMEPRAZOLE MAGNESIUM 40 MG PO CPDR: DELAYED_RELEASE_CAPSULE | ORAL | Status: DC

## 2012-11-23 NOTE — Telephone Encounter (Signed)
Prescription(s) have been sent

## 2012-11-23 NOTE — Telephone Encounter (Signed)
Ok for refills

## 2012-11-23 NOTE — Telephone Encounter (Signed)
Patient is requesting a refill on Clorazepate 7.5 mg and on nexium or something milder.  [facility] [address]

## 2012-12-08 DIAGNOSIS — M1710 Unilateral primary osteoarthritis, unspecified knee: Secondary | ICD-10-CM | POA: Diagnosis not present

## 2012-12-22 ENCOUNTER — Other Ambulatory Visit: Payer: Self-pay | Admitting: Pulmonary Disease

## 2012-12-31 DIAGNOSIS — H4011X0 Primary open-angle glaucoma, stage unspecified: Secondary | ICD-10-CM | POA: Diagnosis not present

## 2013-01-06 ENCOUNTER — Other Ambulatory Visit: Payer: Self-pay

## 2013-01-07 DIAGNOSIS — H3581 Retinal edema: Secondary | ICD-10-CM | POA: Diagnosis not present

## 2013-01-07 DIAGNOSIS — H348192 Central retinal vein occlusion, unspecified eye, stable: Secondary | ICD-10-CM | POA: Diagnosis not present

## 2013-01-07 DIAGNOSIS — H4011X0 Primary open-angle glaucoma, stage unspecified: Secondary | ICD-10-CM | POA: Diagnosis not present

## 2013-01-07 DIAGNOSIS — H31019 Macula scars of posterior pole (postinflammatory) (post-traumatic), unspecified eye: Secondary | ICD-10-CM | POA: Diagnosis not present

## 2013-01-11 DIAGNOSIS — N760 Acute vaginitis: Secondary | ICD-10-CM | POA: Diagnosis not present

## 2013-01-21 DIAGNOSIS — H348192 Central retinal vein occlusion, unspecified eye, stable: Secondary | ICD-10-CM | POA: Diagnosis not present

## 2013-01-24 DIAGNOSIS — M1710 Unilateral primary osteoarthritis, unspecified knee: Secondary | ICD-10-CM | POA: Diagnosis not present

## 2013-01-24 DIAGNOSIS — M23329 Other meniscus derangements, posterior horn of medial meniscus, unspecified knee: Secondary | ICD-10-CM | POA: Diagnosis not present

## 2013-01-26 ENCOUNTER — Other Ambulatory Visit: Payer: Self-pay | Admitting: Orthopedic Surgery

## 2013-01-26 DIAGNOSIS — R531 Weakness: Secondary | ICD-10-CM

## 2013-01-26 DIAGNOSIS — R609 Edema, unspecified: Secondary | ICD-10-CM

## 2013-01-26 DIAGNOSIS — M25562 Pain in left knee: Secondary | ICD-10-CM

## 2013-01-31 ENCOUNTER — Other Ambulatory Visit: Payer: TRICARE For Life (TFL)

## 2013-02-01 ENCOUNTER — Other Ambulatory Visit: Payer: TRICARE For Life (TFL)

## 2013-02-11 DIAGNOSIS — Z1289 Encounter for screening for malignant neoplasm of other sites: Secondary | ICD-10-CM | POA: Diagnosis not present

## 2013-02-22 DIAGNOSIS — H348192 Central retinal vein occlusion, unspecified eye, stable: Secondary | ICD-10-CM | POA: Diagnosis not present

## 2013-03-08 DIAGNOSIS — H348192 Central retinal vein occlusion, unspecified eye, stable: Secondary | ICD-10-CM | POA: Diagnosis not present

## 2013-03-11 ENCOUNTER — Ambulatory Visit
Admission: RE | Admit: 2013-03-11 | Discharge: 2013-03-11 | Disposition: A | Discharge status: Home / Self Care | Payer: Medicare Other | Source: Ambulatory Visit | Attending: Orthopedic Surgery | Admitting: Orthopedic Surgery

## 2013-03-11 DIAGNOSIS — R609 Edema, unspecified: Secondary | ICD-10-CM

## 2013-03-11 DIAGNOSIS — M1710 Unilateral primary osteoarthritis, unspecified knee: Secondary | ICD-10-CM | POA: Diagnosis not present

## 2013-03-11 DIAGNOSIS — IMO0002 Reserved for concepts with insufficient information to code with codable children: Secondary | ICD-10-CM | POA: Diagnosis not present

## 2013-03-11 DIAGNOSIS — R531 Weakness: Secondary | ICD-10-CM

## 2013-03-11 DIAGNOSIS — M25562 Pain in left knee: Secondary | ICD-10-CM

## 2013-03-16 ENCOUNTER — Telehealth: Payer: Self-pay | Admitting: *Deleted

## 2013-03-16 DIAGNOSIS — M1710 Unilateral primary osteoarthritis, unspecified knee: Secondary | ICD-10-CM | POA: Diagnosis not present

## 2013-03-16 MED ORDER — ESOMEPRAZOLE MAGNESIUM 40 MG PO CPDR: DELAYED_RELEASE_CAPSULE | ORAL | Status: DC

## 2013-03-16 MED ORDER — SIMVASTATIN 20 MG PO TABS
20.0000 mg | ORAL_TABLET | Freq: Every evening | ORAL | Status: DC
Start: 2013-03-16 — End: 2013-05-20

## 2013-03-16 NOTE — Telephone Encounter (Signed)
Patient phoned requesting refill zocor, nexium.  Refilled per protocol.  Last OV with PCP 08/24/2012

## 2013-04-12 DIAGNOSIS — H3581 Retinal edema: Secondary | ICD-10-CM | POA: Diagnosis not present

## 2013-04-12 DIAGNOSIS — H31019 Macula scars of posterior pole (postinflammatory) (post-traumatic), unspecified eye: Secondary | ICD-10-CM | POA: Diagnosis not present

## 2013-04-12 DIAGNOSIS — H348392 Tributary (branch) retinal vein occlusion, unspecified eye, stable: Secondary | ICD-10-CM | POA: Diagnosis not present

## 2013-05-12 ENCOUNTER — Ambulatory Visit: Payer: TRICARE For Life (TFL) | Admitting: Family Medicine

## 2013-05-20 ENCOUNTER — Ambulatory Visit (INDEPENDENT_AMBULATORY_CARE_PROVIDER_SITE_OTHER): Payer: Medicare Other | Admitting: Family Medicine

## 2013-05-20 ENCOUNTER — Encounter: Payer: Self-pay | Admitting: Family Medicine

## 2013-05-20 VITALS — BP 120/68 | HR 73 | Wt 150.0 lb

## 2013-05-20 DIAGNOSIS — F411 Generalized anxiety disorder: Secondary | ICD-10-CM | POA: Diagnosis not present

## 2013-05-20 DIAGNOSIS — E7800 Pure hypercholesterolemia, unspecified: Secondary | ICD-10-CM

## 2013-05-20 DIAGNOSIS — K219 Gastro-esophageal reflux disease without esophagitis: Secondary | ICD-10-CM | POA: Diagnosis not present

## 2013-05-20 NOTE — Patient Instructions (Signed)

## 2013-05-20 NOTE — Progress Notes (Signed)
   Subjective:    Patient ID: Hannah Madden, female    DOB: 1934-09-21, 79 y.o.   MRN: [id number]  HPI Patient is transferring here from Dr.Norins.  She has history of hyperlipidemia, GERD, intermittent anxiety, hyperlipidemia, and osteoarthritis. She has taken simvastatin past but not recently and is trying to manage hyperlipidemia with lifestyle changes. Takes Nexium for GERD the symptoms are well controlled. She has intermittent anxiety symptoms and rarely takes Tranxene. She has not taken Prevnar nor shingles vaccine the past and she has refused both. Her tetanus is up-to-date.  She is married and has 4 children and 7 grandchildren. Has never smoked. Exercises 5 days per week. No recent falls. No recent depression issues.  Past Medical History  Diagnosis Date  . Allergy history unknown   . GERD (gastroesophageal reflux disease)   . Alopecia     stress related and resolved since her Mother moved to SNF  . Anxiety     very situational related to care-taker role.  . Glaucoma   . Hypercholesteremia     life-style management  . 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 Abdominal hysterectomy      fibroids.    reports that she has never smoked. She has never used smokeless tobacco. She reports that she does not drink alcohol or use illicit drugs. family history is negative for COPD, Diabetes, Heart disease, and Stroke. Allergies  Allergen Reactions  . Atorvastatin     REACTION: INTOL to Lipitor w/ leg pain  . Penicillins     REACTION: hives        Review of Systems  Constitutional: Negative for fatigue and unexpected weight change.  Eyes: Negative for visual disturbance.  Respiratory: Negative for cough, chest tightness, shortness of breath and wheezing.   Cardiovascular: Negative for chest pain, palpitations and leg swelling.  Endocrine: Negative for polydipsia and polyuria.  Neurological: Negative for dizziness, seizures,  syncope, weakness, light-headedness and headaches.       Objective:   Physical Exam  Constitutional: She is oriented to person, place, and time. She appears well-developed and well-nourished.  Neck: Neck supple. No thyromegaly present.  Cardiovascular: Normal rate and regular rhythm.   Pulmonary/Chest: Effort normal and breath sounds normal. No respiratory distress. She has no wheezes. She has no rales.  Musculoskeletal: She exhibits no edema.  Neurological: She is alert and oriented to person, place, and time. No cranial nerve deficit.          Assessment & Plan:  #1 GERD. Well controlled with Nexium #2 history of chronic intermittent anxiety. She's taken low-dose Tranxene in the past and uses very infrequently. #3 history of hyperlipidemia. Patient prefers lifestyle management apparently is controlled fairly well without medication. Recheck lipids at followup this summer #4 health maintenance. We recommend Prevnar 13. She's given information and will consider at followup for physical

## 2013-05-20 NOTE — Progress Notes (Signed)
Pre visit review using our clinic review tool, if applicable. No additional management support is needed unless otherwise documented below in the visit note. 

## 2013-06-01 ENCOUNTER — Telehealth: Payer: Self-pay | Admitting: Family Medicine

## 2013-06-01 NOTE — Telephone Encounter (Signed)
Pt informed

## 2013-06-01 NOTE — Telephone Encounter (Signed)
She could try adding Pepcid 10 mg twice daily as needed -OTC

## 2013-06-01 NOTE — Telephone Encounter (Signed)
Last visit 05/2013

## 2013-06-01 NOTE — Telephone Encounter (Signed)
Pt is on nexium however pt would like to know if theres a medication she can try to help with acid reflux and indigestion at same time. Pt has been experiencing indigestion when eating acid food likes oranges etc [facility] [address]

## 2013-06-08 DIAGNOSIS — H3581 Retinal edema: Secondary | ICD-10-CM | POA: Diagnosis not present

## 2013-06-08 DIAGNOSIS — H348192 Central retinal vein occlusion, unspecified eye, stable: Secondary | ICD-10-CM | POA: Diagnosis not present

## 2013-06-15 DIAGNOSIS — M1710 Unilateral primary osteoarthritis, unspecified knee: Secondary | ICD-10-CM | POA: Diagnosis not present

## 2013-07-27 DIAGNOSIS — H4011X0 Primary open-angle glaucoma, stage unspecified: Secondary | ICD-10-CM | POA: Diagnosis not present

## 2013-08-01 ENCOUNTER — Ambulatory Visit (INDEPENDENT_AMBULATORY_CARE_PROVIDER_SITE_OTHER): Payer: Medicare Other | Admitting: Family Medicine

## 2013-08-01 ENCOUNTER — Ambulatory Visit: Payer: Medicare Other | Admitting: Physician Assistant

## 2013-08-01 ENCOUNTER — Encounter: Payer: Self-pay | Admitting: Family Medicine

## 2013-08-01 VITALS — BP 110/76 | HR 66 | Temp 97.8°F | Ht 64.0 in | Wt 152.0 lb

## 2013-08-01 DIAGNOSIS — W57XXXA Bitten or stung by nonvenomous insect and other nonvenomous arthropods, initial encounter: Secondary | ICD-10-CM

## 2013-08-01 DIAGNOSIS — T148 Other injury of unspecified body region: Secondary | ICD-10-CM

## 2013-08-01 NOTE — Progress Notes (Signed)
No chief complaint on file.   HPI:  Acute visit for:  1) Bug Bite: -after working in yard  -itches -no pain, fevers, SOB, malaise  ROS: See pertinent positives and negatives per HPI.  Past Medical History  Diagnosis Date  . Allergy history unknown   . GERD (gastroesophageal reflux disease)   . Alopecia     stress related and resolved since her Mother moved to SNF  . Anxiety     very situational related to care-taker role.  . Glaucoma   . Hypercholesteremia     life-style management  . 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 Abdominal hysterectomy      fibroids.    Family History  Problem Relation Age of Onset  . COPD Neg Hx   . Diabetes Neg Hx   . Heart disease Neg Hx   . Stroke Neg Hx     History   Social History  . Marital Status: Married    Spouse Name: N/A    Number of Children: 4  . Years of Education: 16+   Occupational History  . financial Nutritional therapist, now emeritus   Social History Main Topics  . Smoking status: Never Smoker   . Smokeless tobacco: Never Used     Comment: tried to smoke in college  . Alcohol Use: No  . Drug Use: No  . Sexual Activity: Yes    Partners: Female   Other Topics Concern  . None   Social History Narrative   HSG, [employer], [employer], [employer] - no degree. Married '56. 2 sons - '64, '71; 2 dtrs - '57, '59; 6 grandchildren. 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 She goes to the gym regularly, bowls, takes classes. ACP - discussed the "gift of Love" of ACP.    Current outpatient prescriptions:cholecalciferol (VITAMIN D) 1000 UNITS tablet, Take 1,000 Units by mouth daily.  , Disp: , Rfl: ;  clorazepate (TRANXENE) 7.5 MG tablet, Take 1 tablet (7.5 mg total) by mouth 3 (three) times daily., Disp: 90 tablet, Rfl: 0;  Coenzyme Q10 (COQ10) 100 MG CAPS, Take 1 capsule by mouth  daily.  , Disp: , Rfl: ;  esomeprazole (NEXIUM) 40 MG capsule, TAKE ONE CAPSULE BY MOUTH EVERY DAY, Disp: 90 capsule, Rfl: 3 Garlic 413 MG TABS, Take 1 tablet by mouth daily.  , Disp: , Rfl: ;  Magnesium 250 MG TABS, Take 1 tablet by mouth daily.  , Disp: , Rfl: ;  vitamin C (ASCORBIC ACID) 500 MG tablet, Take 1,000 mg by mouth daily. , Disp: , Rfl: ;  vitamin E 400 UNIT capsule, Take 400 Units by mouth daily.  , Disp: , Rfl:   EXAM:  Filed Vitals:   08/01/13 1535  BP: 110/76  Pulse: 66  Temp: 97.8 F (36.6 C)    Body mass index is 26.08 kg/(m^2).  GENERAL: vitals reviewed and listed above, alert, oriented, appears well hydrated and in no acute distress  HEENT: atraumatic, conjunttiva clear, no obvious abnormalities on inspection of external nose and ears  NECK: no obvious masses on inspection  SKIN: insect bite l wrist, no signs of infection  MS: moves all extremities without noticeable abnormality  PSYCH: pleasant and cooperative, no obvious depression or anxiety  ASSESSMENT AND PLAN:  Discussed the following assessment and plan:  Bug bite  -no signs  of infection - avised OTC anti-itch cream, abx ointment, return precuaitons, bug repellant, utd on tetanus -Patient advised to return or notify a doctor immediately if symptoms worsen or persist or new concerns arise.  Patient Instructions  Insect Bite Mosquitoes, flies, fleas, bedbugs, and many other insects can bite. Insect bites are different from insect stings. A sting is when venom is injected into the skin. Some insect bites can transmit infectious diseases. SYMPTOMS  Insect bites usually turn red, swell, and itch for 2 to 4 days. They often go away on their own. TREATMENT  Your caregiver may prescribe antibiotic medicines if a bacterial infection develops in the bite. HOME CARE INSTRUCTIONS  Do not scratch the bite area.  Keep the bite area clean and dry. Wash the bite area thoroughly with soap and water.  Put  ice or cool compresses on the bite area.  Put ice in a plastic bag.  Place a towel between your skin and the bag.  Leave the ice on for 20 minutes, 4 times a day for the first 2 to 3 days, or as directed.  You may apply a baking soda paste, cortisone cream, or calamine lotion to the bite area as directed by your caregiver. This can help reduce itching and swelling.  Only take over-the-counter or prescription medicines as directed by your caregiver.  If you are given antibiotics, take them as directed. Finish them even if you start to feel better. You may need a tetanus shot if:  You cannot remember when you had your last tetanus shot.  You have never had a tetanus shot.  The injury broke your skin. If you get a tetanus shot, your arm may swell, get red, and feel warm to the touch. This is common and not a problem. If you need a tetanus shot and you choose not to have one, there is a rare chance of getting tetanus. Sickness from tetanus can be serious. SEEK IMMEDIATE MEDICAL CARE IF:   You have increased pain, redness, or swelling in the bite area.  You see a red line on the skin coming from the bite.  You have a fever.  You have joint pain.  You have a headache or neck pain.  You have unusual weakness.  You have a rash.  You have chest pain or shortness of breath.  You have abdominal pain, nausea, or vomiting.  You feel unusually tired or sleepy. MAKE SURE YOU:   Understand these instructions.  Will watch your condition.  Will get help right away if you are not doing well or get worse. Document Released: 03/27/2004 Document Revised: 05/12/2011 Document Reviewed: 09/18/2010 [facility] Patient Information 2014 [place].      Hannah Madden

## 2013-08-01 NOTE — Progress Notes (Signed)
Pre visit review using our clinic review tool, if applicable. No additional management support is needed unless otherwise documented below in the visit note. 

## 2013-08-01 NOTE — Patient Instructions (Signed)
Insect Bite  Mosquitoes, flies, fleas, bedbugs, and many other insects can bite. Insect bites are different from insect stings. A sting is when venom is injected into the skin. Some insect bites can transmit infectious diseases.  SYMPTOMS   Insect bites usually turn red, swell, and itch for 2 to 4 days. They often go away on their own.  TREATMENT   Your caregiver may prescribe antibiotic medicines if a bacterial infection develops in the bite.  HOME CARE INSTRUCTIONS   Do not scratch the bite area.   Keep the bite area clean and dry. Wash the bite area thoroughly with soap and water.   Put ice or cool compresses on the bite area.   Put ice in a plastic bag.   Place a towel between your skin and the bag.   Leave the ice on for 20 minutes, 4 times a day for the first 2 to 3 days, or as directed.   You may apply a baking soda paste, cortisone cream, or calamine lotion to the bite area as directed by your caregiver. This can help reduce itching and swelling.   Only take over-the-counter or prescription medicines as directed by your caregiver.   If you are given antibiotics, take them as directed. Finish them even if you start to feel better.  You may need a tetanus shot if:   You cannot remember when you had your last tetanus shot.   You have never had a tetanus shot.   The injury broke your skin.  If you get a tetanus shot, your arm may swell, get red, and feel warm to the touch. This is common and not a problem. If you need a tetanus shot and you choose not to have one, there is a rare chance of getting tetanus. Sickness from tetanus can be serious.  SEEK IMMEDIATE MEDICAL CARE IF:    You have increased pain, redness, or swelling in the bite area.   You see a red line on the skin coming from the bite.   You have a fever.   You have joint pain.   You have a headache or neck pain.   You have unusual weakness.   You have a rash.   You have chest pain or shortness of breath.    You have abdominal pain, nausea, or vomiting.   You feel unusually tired or sleepy.  MAKE SURE YOU:    Understand these instructions.   Will watch your condition.   Will get help right away if you are not doing well or get worse.  Document Released: 03/27/2004 Document Revised: 05/12/2011 Document Reviewed: 09/18/2010  [employer] Patient Information 2014 [employer].

## 2013-08-08 DIAGNOSIS — M23329 Other meniscus derangements, posterior horn of medial meniscus, unspecified knee: Secondary | ICD-10-CM | POA: Diagnosis not present

## 2013-08-09 DIAGNOSIS — H10439 Chronic follicular conjunctivitis, unspecified eye: Secondary | ICD-10-CM | POA: Diagnosis not present

## 2013-08-10 ENCOUNTER — Other Ambulatory Visit (HOSPITAL_COMMUNITY): Payer: Self-pay | Admitting: Orthopedic Surgery

## 2013-08-29 ENCOUNTER — Encounter (HOSPITAL_COMMUNITY): Payer: Self-pay

## 2013-08-31 NOTE — Pre-Procedure Instructions (Signed)
Shayna Eblen [facility]  08/31/2013   Your procedure is scheduled on:  Mon, July 13 @ 11:45 AM  Report to [facility] at 9:45 AM.  Call this number if you have problems the morning of surgery: [phone]   Remember:   Do not eat food or drink liquids after midnight.   Take these medicines the morning of surgery with A SIP OF WATER: Eye Drops and Nexium(Esomeprazole)               Stop taking your CO Q10,Vit E,and any Herbal Medications. No Goody's,BC's,Aleve,Aspirin,or any Herbal Medications   Do not wear jewelry, make-up or nail polish.  Do not wear lotions, powders, or perfumes. You may wear deodorant.  Do not shave 48 hours prior to surgery.   Do not bring valuables to the hospital.  [facility] is not responsible                  for any belongings or valuables.               Contacts, dentures or bridgework may not be worn into surgery.  Leave suitcase in the car. After surgery it may be brought to your room.  For patients admitted to the hospital, discharge time is determined by your                treatment team.               Patients discharged the day of surgery will not be allowed to drive  home.    Special Instructions:  [place] - Preparing for Surgery  Before surgery, you can play an important role.  Because skin is not sterile, your skin needs to be as free of germs as possible.  You can reduce the number of germs on you skin by washing with CHG (chlorahexidine gluconate) soap before surgery.  CHG is an antiseptic cleaner which kills germs and bonds with the skin to continue killing germs even after washing.  Please DO NOT use if you have an allergy to CHG or antibacterial soaps.  If your skin becomes reddened/irritated stop using the CHG and inform your nurse when you arrive at Short Stay.  Do not shave (including legs and underarms) for at least 48 hours prior to the first CHG shower.  You may shave your face.  Please follow these instructions  carefully:   1.  Shower with CHG Soap the night before surgery and the                                morning of Surgery.  2.  If you choose to wash your hair, wash your hair first as usual with your       normal shampoo.  3.  After you shampoo, rinse your hair and body thoroughly to remove the                      Shampoo.  4.  Use CHG as you would any other liquid soap.  You can apply chg directly       to the skin and wash gently with scrungie or a clean washcloth.  5.  Apply the CHG Soap to your body ONLY FROM THE NECK DOWN.        Do not use on open wounds or open sores.  Avoid contact with your eyes,  ears, mouth and genitals (private parts).  Wash genitals (private parts)       with your normal soap.  6.  Wash thoroughly, paying special attention to the area where your surgery        will be performed.  7.  Thoroughly rinse your body with warm water from the neck down.  8.  DO NOT shower/wash with your normal soap after using and rinsing off       the CHG Soap.  9.  Pat yourself dry with a clean towel.            10.  Wear clean pajamas.            11.  Place clean sheets on your bed the night of your first shower and do not        sleep with pets.  Day of Surgery  Do not apply any lotions/deoderants the morning of surgery.  Please wear clean clothes to the hospital/surgery center.     Please read over the following fact sheets that you were given: Pain Booklet, Coughing and Deep Breathing and Surgical Site Infection Prevention

## 2013-09-01 ENCOUNTER — Encounter (HOSPITAL_COMMUNITY)
Admission: RE | Admit: 2013-09-01 | Discharge: 2013-09-01 | Disposition: A | Discharge status: Home / Self Care | Payer: Medicare Other | Source: Ambulatory Visit | Attending: Orthopedic Surgery | Admitting: Orthopedic Surgery

## 2013-09-01 ENCOUNTER — Encounter (HOSPITAL_COMMUNITY): Payer: Self-pay

## 2013-09-01 DIAGNOSIS — Z01810 Encounter for preprocedural cardiovascular examination: Secondary | ICD-10-CM | POA: Diagnosis not present

## 2013-09-01 DIAGNOSIS — Z01812 Encounter for preprocedural laboratory examination: Secondary | ICD-10-CM | POA: Insufficient documentation

## 2013-09-01 HISTORY — DX: Personal history of other diseases of the digestive system: Z87.19

## 2013-09-01 HISTORY — DX: Pneumonia, unspecified organism: J18.9

## 2013-09-01 LAB — BASIC METABOLIC PANEL
Anion gap: 12 (ref 5–15)
BUN: 10 mg/dL (ref 6–23)
CO2: 24 meq/L (ref 19–32)
CREATININE: 0.73 mg/dL (ref 0.50–1.10)
Calcium: 9.1 mg/dL (ref 8.4–10.5)
Chloride: 102 mEq/L (ref 96–112)
GFR calc Af Amer: >90 mL/min (ref >90)
GFR, EST NON AFRICAN AMERICAN: 81 mL/min — AB (ref >90)
GLUCOSE: 103 mg/dL — AB (ref 70–99)
Potassium: 4.8 mEq/L (ref 3.7–5.3)
SODIUM: 138 meq/L (ref 137–147)

## 2013-09-01 LAB — CBC
HEMATOCRIT: 34.8 % — AB (ref 36.0–46.0)
Hemoglobin: 11.4 g/dL — ABNORMAL LOW (ref 12.0–15.0)
MCH: 28.6 pg (ref 26.0–34.0)
MCHC: 32.8 g/dL (ref 30.0–36.0)
MCV: 87.4 fL (ref 78.0–100.0)
PLATELETS: 240 10*3/uL (ref 150–400)
RBC: 3.98 MIL/uL (ref 3.87–5.11)
RDW: 13.5 % (ref 11.5–15.5)
WBC: 5.8 10*3/uL (ref 4.0–10.5)

## 2013-09-01 MED ORDER — CHLORHEXIDINE GLUCONATE 4 % EX LIQD: 60.0000 mL | Freq: Once | CUTANEOUS | Status: DC

## 2013-09-01 NOTE — Progress Notes (Addendum)
Pt doesn't have a cardiologist  Denies ever having an echo/stress test/heart cath  Denies EKG or CXR in past    Dr.Bruce Burchette is Medical Md

## 2013-09-02 DIAGNOSIS — H4011X0 Primary open-angle glaucoma, stage unspecified: Secondary | ICD-10-CM | POA: Diagnosis not present

## 2013-09-02 DIAGNOSIS — H35419 Lattice degeneration of retina, unspecified eye: Secondary | ICD-10-CM | POA: Diagnosis not present

## 2013-09-02 DIAGNOSIS — H348192 Central retinal vein occlusion, unspecified eye, stable: Secondary | ICD-10-CM | POA: Diagnosis not present

## 2013-09-09 NOTE — Progress Notes (Signed)
Patient notified to arrive at 09:00 verbalized understanding

## 2013-09-11 MED ORDER — CLINDAMYCIN PHOSPHATE 900 MG/50ML IV SOLN
900.0000 mg | INTRAVENOUS | Status: AC
  Administered 2013-09-12: 900 mg via INTRAVENOUS
  Filled 2013-09-11: qty 50

## 2013-09-12 ENCOUNTER — Observation Stay (HOSPITAL_COMMUNITY)
Admission: RE | Admit: 2013-09-12 | Discharge: 2013-09-13 | Disposition: A | Discharge status: Home / Self Care | Payer: Medicare Other | Source: Ambulatory Visit | Attending: Orthopedic Surgery | Admitting: Orthopedic Surgery

## 2013-09-12 ENCOUNTER — Encounter (HOSPITAL_COMMUNITY): Payer: Self-pay | Admitting: Anesthesiology

## 2013-09-12 ENCOUNTER — Encounter (HOSPITAL_COMMUNITY): Payer: Medicare Other | Admitting: Anesthesiology

## 2013-09-12 ENCOUNTER — Ambulatory Visit (HOSPITAL_COMMUNITY): Payer: Medicare Other | Admitting: Anesthesiology

## 2013-09-12 ENCOUNTER — Encounter (HOSPITAL_COMMUNITY)
Admission: RE | Disposition: A | Discharge status: Home / Self Care | Payer: Self-pay | Source: Ambulatory Visit | Attending: Orthopedic Surgery

## 2013-09-12 DIAGNOSIS — M23305 Other meniscus derangements, unspecified medial meniscus, unspecified knee: Secondary | ICD-10-CM | POA: Diagnosis present

## 2013-09-12 DIAGNOSIS — M2240 Chondromalacia patellae, unspecified knee: Secondary | ICD-10-CM | POA: Insufficient documentation

## 2013-09-12 DIAGNOSIS — E7800 Pure hypercholesterolemia, unspecified: Secondary | ICD-10-CM | POA: Insufficient documentation

## 2013-09-12 DIAGNOSIS — G8918 Other acute postprocedural pain: Secondary | ICD-10-CM | POA: Diagnosis not present

## 2013-09-12 DIAGNOSIS — H409 Unspecified glaucoma: Secondary | ICD-10-CM | POA: Insufficient documentation

## 2013-09-12 DIAGNOSIS — M23329 Other meniscus derangements, posterior horn of medial meniscus, unspecified knee: Principal | ICD-10-CM | POA: Insufficient documentation

## 2013-09-12 DIAGNOSIS — S83249A Other tear of medial meniscus, current injury, unspecified knee, initial encounter: Secondary | ICD-10-CM

## 2013-09-12 DIAGNOSIS — K219 Gastro-esophageal reflux disease without esophagitis: Secondary | ICD-10-CM | POA: Diagnosis not present

## 2013-09-12 DIAGNOSIS — M23302 Other meniscus derangements, unspecified lateral meniscus, unspecified knee: Secondary | ICD-10-CM | POA: Insufficient documentation

## 2013-09-12 DIAGNOSIS — S83242A Other tear of medial meniscus, current injury, left knee, initial encounter: Secondary | ICD-10-CM

## 2013-09-12 HISTORY — PX: KNEE ARTHROSCOPY WITH MEDIAL MENISECTOMY: SHX5651

## 2013-09-12 SURGERY — ARTHROSCOPY, KNEE, WITH MEDIAL MENISCECTOMY
Anesthesia: General | Site: Knee | Laterality: Left

## 2013-09-12 MED ORDER — ARTIFICIAL TEARS OP OINT
TOPICAL_OINTMENT | OPHTHALMIC | Status: AC
  Filled 2013-09-12: qty 3.5

## 2013-09-12 MED ORDER — FENTANYL CITRATE 0.05 MG/ML IJ SOLN
INTRAMUSCULAR | Status: AC
  Filled 2013-09-12: qty 5

## 2013-09-12 MED ORDER — MIDAZOLAM HCL 2 MG/2ML IJ SOLN
INTRAMUSCULAR | Status: AC
  Filled 2013-09-12: qty 2

## 2013-09-12 MED ORDER — BRIMONIDINE TARTRATE 0.2 % OP SOLN
1.0000 [drp] | Freq: Two times a day (BID) | OPHTHALMIC | Status: DC
  Administered 2013-09-12 – 2013-09-13 (×2): 1 [drp] via OPHTHALMIC
  Filled 2013-09-12: qty 5

## 2013-09-12 MED ORDER — SODIUM CHLORIDE 0.9 % IR SOLN
Status: DC | PRN
  Administered 2013-09-12 (×2): 3000 mL

## 2013-09-12 MED ORDER — PHENYLEPHRINE HCL 10 MG/ML IJ SOLN
INTRAMUSCULAR | Status: DC | PRN
  Administered 2013-09-12 (×3): 80 ug via INTRAVENOUS

## 2013-09-12 MED ORDER — LACTATED RINGERS IV SOLN
INTRAVENOUS | Status: DC | PRN
  Administered 2013-09-12 (×2): via INTRAVENOUS

## 2013-09-12 MED ORDER — SIMVASTATIN 20 MG PO TABS
20.0000 mg | ORAL_TABLET | Freq: Every day | ORAL | Status: DC
  Administered 2013-09-12: 20 mg via ORAL
  Filled 2013-09-12 (×2): qty 1

## 2013-09-12 MED ORDER — FENTANYL CITRATE 0.05 MG/ML IJ SOLN
INTRAMUSCULAR | Status: AC
  Administered 2013-09-12: 50 ug
  Filled 2013-09-12: qty 2

## 2013-09-12 MED ORDER — ONDANSETRON HCL 4 MG/2ML IJ SOLN: 4.0000 mg | Freq: Four times a day (QID) | INTRAMUSCULAR | Status: DC | PRN

## 2013-09-12 MED ORDER — PROPOFOL 10 MG/ML IV BOLUS
INTRAVENOUS | Status: DC | PRN
  Administered 2013-09-12: 110 mg via INTRAVENOUS

## 2013-09-12 MED ORDER — POTASSIUM CHLORIDE IN NACL 20-0.9 MEQ/L-% IV SOLN
INTRAVENOUS | Status: AC
  Filled 2013-09-12: qty 1000

## 2013-09-12 MED ORDER — BUPIVACAINE-EPINEPHRINE (PF) 0.25% -1:200000 IJ SOLN
INTRAMUSCULAR | Status: DC | PRN
  Administered 2013-09-12: 20 mL

## 2013-09-12 MED ORDER — LACTATED RINGERS IV SOLN
INTRAVENOUS | Status: DC
  Administered 2013-09-12: 10:00:00 via INTRAVENOUS

## 2013-09-12 MED ORDER — ONDANSETRON HCL 4 MG PO TABS: 4.0000 mg | ORAL_TABLET | Freq: Four times a day (QID) | ORAL | Status: DC | PRN

## 2013-09-12 MED ORDER — MIDAZOLAM HCL 2 MG/2ML IJ SOLN
INTRAMUSCULAR | Status: AC
  Administered 2013-09-12: 2 mg
  Filled 2013-09-12: qty 2

## 2013-09-12 MED ORDER — LATANOPROST 0.005 % OP SOLN
1.0000 [drp] | Freq: Every day | OPHTHALMIC | Status: DC
  Administered 2013-09-12: 1 [drp] via OPHTHALMIC
  Filled 2013-09-12: qty 2.5

## 2013-09-12 MED ORDER — ASPIRIN 325 MG PO TABS
325.0000 mg | ORAL_TABLET | Freq: Every day | ORAL | Status: DC
  Administered 2013-09-13: 325 mg via ORAL
  Filled 2013-09-12: qty 1

## 2013-09-12 MED ORDER — BUPIVACAINE HCL (PF) 0.25 % IJ SOLN
INTRAMUSCULAR | Status: AC
  Filled 2013-09-12: qty 30

## 2013-09-12 MED ORDER — ONDANSETRON HCL 4 MG/2ML IJ SOLN
INTRAMUSCULAR | Status: DC | PRN
  Administered 2013-09-12: 4 mg via INTRAVENOUS

## 2013-09-12 MED ORDER — LIDOCAINE HCL (CARDIAC) 20 MG/ML IV SOLN
INTRAVENOUS | Status: AC
  Filled 2013-09-12: qty 5

## 2013-09-12 MED ORDER — BRINZOLAMIDE 1 % OP SUSP
1.0000 [drp] | Freq: Two times a day (BID) | OPHTHALMIC | Status: DC
  Administered 2013-09-12 – 2013-09-13 (×2): 1 [drp] via OPHTHALMIC
  Filled 2013-09-12: qty 10

## 2013-09-12 MED ORDER — CLONIDINE HCL (ANALGESIA) 100 MCG/ML EP SOLN
EPIDURAL | Status: DC | PRN
  Administered 2013-09-12: 150 ug

## 2013-09-12 MED ORDER — CLINDAMYCIN PHOSPHATE 600 MG/50ML IV SOLN
600.0000 mg | Freq: Four times a day (QID) | INTRAVENOUS | Status: AC
  Administered 2013-09-12: 600 mg via INTRAVENOUS
  Filled 2013-09-12: qty 50

## 2013-09-12 MED ORDER — METOCLOPRAMIDE HCL 10 MG PO TABS
5.0000 mg | ORAL_TABLET | Freq: Three times a day (TID) | ORAL | Status: DC | PRN
  Administered 2013-09-13: 10 mg via ORAL
  Filled 2013-09-12: qty 1

## 2013-09-12 MED ORDER — CLONIDINE HCL (ANALGESIA) 100 MCG/ML EP SOLN
150.0000 ug | EPIDURAL | Status: DC
  Filled 2013-09-12: qty 1.5

## 2013-09-12 MED ORDER — OXYCODONE HCL 5 MG PO TABS
5.0000 mg | ORAL_TABLET | ORAL | Status: DC | PRN
  Administered 2013-09-12 – 2013-09-13 (×4): 10 mg via ORAL
  Filled 2013-09-12 (×4): qty 2

## 2013-09-12 MED ORDER — BRIMONIDINE TARTRATE-TIMOLOL 0.2-0.5 % OP SOLN: 1.0000 [drp] | Freq: Two times a day (BID) | OPHTHALMIC | Status: DC

## 2013-09-12 MED ORDER — TIMOLOL MALEATE 0.5 % OP SOLN
1.0000 [drp] | Freq: Two times a day (BID) | OPHTHALMIC | Status: DC
  Administered 2013-09-12 – 2013-09-13 (×2): 1 [drp] via OPHTHALMIC
  Filled 2013-09-12: qty 5

## 2013-09-12 MED ORDER — PROPOFOL 10 MG/ML IV BOLUS
INTRAVENOUS | Status: AC
  Filled 2013-09-12: qty 20

## 2013-09-12 MED ORDER — METOCLOPRAMIDE HCL 5 MG/ML IJ SOLN: 5.0000 mg | Freq: Three times a day (TID) | INTRAMUSCULAR | Status: DC | PRN

## 2013-09-12 MED ORDER — ONDANSETRON HCL 4 MG/2ML IJ SOLN
INTRAMUSCULAR | Status: AC
  Filled 2013-09-12: qty 2

## 2013-09-12 MED ORDER — MORPHINE SULFATE 4 MG/ML IJ SOLN
INTRAMUSCULAR | Status: AC
  Filled 2013-09-12: qty 2

## 2013-09-12 MED ORDER — LIDOCAINE HCL (CARDIAC) 20 MG/ML IV SOLN
INTRAVENOUS | Status: DC | PRN
  Administered 2013-09-12: 60 mg via INTRAVENOUS

## 2013-09-12 MED ORDER — MORPHINE SULFATE 2 MG/ML IJ SOLN: 1.0000 mg | INTRAMUSCULAR | Status: DC | PRN

## 2013-09-12 MED ORDER — FENTANYL CITRATE 0.05 MG/ML IJ SOLN
INTRAMUSCULAR | Status: DC | PRN
Start: 2013-09-12 — End: 2013-09-12
  Administered 2013-09-12: 100 ug via INTRAVENOUS

## 2013-09-12 MED ORDER — MIDAZOLAM HCL 5 MG/5ML IJ SOLN
INTRAMUSCULAR | Status: DC | PRN
  Administered 2013-09-12: 2 mg via INTRAVENOUS

## 2013-09-12 MED ORDER — MORPHINE SULFATE 4 MG/ML IJ SOLN
INTRAMUSCULAR | Status: DC | PRN
  Administered 2013-09-12: 8 mg via INTRAVENOUS

## 2013-09-12 MED ORDER — BUPIVACAINE HCL (PF) 0.25 % IJ SOLN
INTRAMUSCULAR | Status: DC | PRN
  Administered 2013-09-12: 30 mL

## 2013-09-12 MED ORDER — BUPIVACAINE-EPINEPHRINE (PF) 0.25% -1:200000 IJ SOLN
INTRAMUSCULAR | Status: AC
Start: 2013-09-12 — End: 2013-09-12
  Filled 2013-09-12: qty 30

## 2013-09-12 MED ORDER — PANTOPRAZOLE SODIUM 40 MG PO TBEC
40.0000 mg | DELAYED_RELEASE_TABLET | Freq: Every day | ORAL | Status: DC
  Administered 2013-09-13: 40 mg via ORAL
  Filled 2013-09-12: qty 1

## 2013-09-12 SURGICAL SUPPLY — 49 items
BANDAGE ELASTIC 6 VELCRO ST LF (GAUZE/BANDAGES/DRESSINGS) ×3 IMPLANT
BANDAGE ESMARK 6X9 LF (GAUZE/BANDAGES/DRESSINGS) IMPLANT
BLADE CUDA 5.5 (BLADE) IMPLANT
BLADE GREAT WHITE 4.2 (BLADE) IMPLANT
BLADE GREAT WHITE 4.2MM (BLADE)
BLADE SURG ROTATE 9660 (MISCELLANEOUS) IMPLANT
BNDG ESMARK 6X9 LF (GAUZE/BANDAGES/DRESSINGS)
BUR OVAL 6.0 (BURR) IMPLANT
COVER SURGICAL LIGHT HANDLE (MISCELLANEOUS) ×3 IMPLANT
CUFF TOURNIQUET SINGLE 34IN LL (TOURNIQUET CUFF) IMPLANT
DRAPE ARTHROSCOPY W/POUCH 114 (DRAPES) ×3 IMPLANT
DRAPE INCISE IOBAN 66X45 STRL (DRAPES) ×3 IMPLANT
DRAPE PROXIMA HALF (DRAPES) ×3 IMPLANT
DRAPE U-SHAPE 47X51 STRL (DRAPES) ×3 IMPLANT
DURAPREP 26ML APPLICATOR (WOUND CARE) ×3 IMPLANT
GAUZE XEROFORM 1X8 LF (GAUZE/BANDAGES/DRESSINGS) ×3 IMPLANT
GLOVE BIOGEL PI IND STRL 8 (GLOVE) ×1 IMPLANT
GLOVE BIOGEL PI INDICATOR 8 (GLOVE) ×2
GLOVE SURG ORTHO 8.0 STRL STRW (GLOVE) ×3 IMPLANT
GOWN STRL REUS W/ TWL LRG LVL3 (GOWN DISPOSABLE) ×2 IMPLANT
GOWN STRL REUS W/ TWL XL LVL3 (GOWN DISPOSABLE) ×1 IMPLANT
GOWN STRL REUS W/TWL LRG LVL3 (GOWN DISPOSABLE) ×4
GOWN STRL REUS W/TWL XL LVL3 (GOWN DISPOSABLE) ×2
KIT BASIN OR (CUSTOM PROCEDURE TRAY) ×3 IMPLANT
KIT ROOM TURNOVER OR (KITS) ×3 IMPLANT
MANIFOLD NEPTUNE II (INSTRUMENTS) ×3 IMPLANT
NEEDLE 18GX1X1/2 (RX/OR ONLY) (NEEDLE) ×3 IMPLANT
NEEDLE HYPO 25GX1X1/2 BEV (NEEDLE) ×3 IMPLANT
NS IRRIG 1000ML POUR BTL (IV SOLUTION) IMPLANT
PACK ARTHROSCOPY DSU (CUSTOM PROCEDURE TRAY) ×3 IMPLANT
PAD ARMBOARD 7.5X6 YLW CONV (MISCELLANEOUS) ×6 IMPLANT
PADDING CAST ABS 6INX4YD NS (CAST SUPPLIES) ×2
PADDING CAST ABS COTTON 6X4 NS (CAST SUPPLIES) ×1 IMPLANT
PADDING CAST COTTON 6X4 STRL (CAST SUPPLIES) ×3 IMPLANT
SET ARTHROSCOPY TUBING (MISCELLANEOUS) ×2
SET ARTHROSCOPY TUBING LN (MISCELLANEOUS) ×1 IMPLANT
SPONGE GAUZE 4X4 12PLY (GAUZE/BANDAGES/DRESSINGS) ×3 IMPLANT
SPONGE LAP 4X18 X RAY DECT (DISPOSABLE) ×3 IMPLANT
SUT ETHILON 3 0 PS 1 (SUTURE) ×3 IMPLANT
SUT MENISCAL KIT (KITS) IMPLANT
SYR 20ML ECCENTRIC (SYRINGE) ×3 IMPLANT
SYR CONTROL 10ML LL (SYRINGE) ×3 IMPLANT
SYR TB 1ML LUER SLIP (SYRINGE) ×3 IMPLANT
TOWEL OR 17X24 6PK STRL BLUE (TOWEL DISPOSABLE) ×3 IMPLANT
TOWEL OR 17X26 10 PK STRL BLUE (TOWEL DISPOSABLE) ×3 IMPLANT
TUBE CONNECTING 12'X1/4 (SUCTIONS) ×1
TUBE CONNECTING 12X1/4 (SUCTIONS) ×2 IMPLANT
WAND HAND CNTRL MULTIVAC 90 (MISCELLANEOUS) ×6 IMPLANT
WATER STERILE IRR 1000ML POUR (IV SOLUTION) ×3 IMPLANT

## 2013-09-12 NOTE — H&P (Signed)
Hannah Madden is an 79 y.o. female.   Chief Complaint: Left knee pain HPI: Hannah Madden is a 79 year old patient with left knee pain this is been going on for almost a year. She is failed injection and physical therapy program along with nonsteroidals. MRI scan consistent with medial meniscal tear she presents now for operative management of medial meniscal tear after expiration risk and benefits she does report mechanical symptoms in the knee consistent with medial compartment pathology.  Past Medical History  Diagnosis Date  . Alopecia     stress related and resolved since her Mother moved to SNF  . Glaucoma     uses eye drops daily  . Hypercholesteremia     takes Simvastatin daily  . GERD (gastroesophageal reflux disease)     takes Nexium daily  . Pneumonia 1960  . H/O hiatal hernia     Past Surgical History  Procedure Laterality Date  . Abdominal hysterectomy      fibroids.  . Colonoscopy      Family History  Problem Relation Age of Onset  . COPD Neg Hx   . Diabetes Neg Hx   . Heart disease Neg Hx   . Stroke Neg Hx    Social History:  reports that she has never smoked. She has never used smokeless tobacco. She reports that she does not drink alcohol or use illicit drugs.  Allergies:  Allergies  Allergen Reactions  . Atorvastatin     REACTION: INTOL to Lipitor w/ leg pain  . Penicillins     REACTION: hives    Medications Prior to Admission  Medication Sig Dispense Refill  . bimatoprost (LUMIGAN) 0.01 % SOLN Place 1 drop into both eyes at bedtime.      . brimonidine-timolol (COMBIGAN) 0.2-0.5 % ophthalmic solution Place 1 drop into both eyes 2 (two) times daily.      . brinzolamide (AZOPT) 1 % ophthalmic suspension Place 1 drop into both eyes 2 (two) times daily.      . Coenzyme Q10 (COQ10) 100 MG CAPS Take 100 mg by mouth daily.       Marland Kitchen esomeprazole (NEXIUM) 40 MG capsule Take 40 mg by mouth daily at 12 noon.      . Garlic 458 MG TABS Take 100 mg by mouth daily.       . Magnesium 500 MG TABS Take 1 tablet by mouth daily.      . simvastatin (ZOCOR) 20 MG tablet Take 20 mg by mouth at bedtime.      . vitamin C (ASCORBIC ACID) 500 MG tablet Take 1,000 mg by mouth daily.       . vitamin E 400 UNIT capsule Take 400 Units by mouth daily.          No results found for this or any previous visit (from the past 48 hour(s)). No results found.  Review of Systems  Constitutional: Negative.   HENT: Negative.   Eyes: Negative.   Respiratory: Negative.   Cardiovascular: Negative.   Gastrointestinal: Negative.   Genitourinary: Negative.   Musculoskeletal: Positive for joint pain.  Skin: Negative.   Neurological: Negative.   Endo/Heme/Allergies: Negative.   Psychiatric/Behavioral: Negative.     Blood pressure 137/51, pulse 56, temperature 97.9 F (36.6 C), temperature source Oral, resp. rate 12, height 5\' 5"  (1.651 m), weight 66.906 kg (147 lb 8 oz), SpO2 100.00%. Physical Exam  Constitutional: She appears well-developed.  HENT:  Head: Normocephalic.  Eyes: Pupils are equal, round, and reactive to  light.  Neck: Normal range of motion.  Cardiovascular: Normal rate.   Respiratory: Effort normal.  Neurological: She is alert.  Skin: Skin is warm.  Psychiatric: She has a normal mood and affect.   left knee demonstrates mild effusion medial joint line tenderness tablecloth crucial ligaments positive work compression testing for medial compartment pathology in that case he'll PCL the posterior lateral rotatory instability pedal pulses palpable no groin pain with internal extra rotation of the leg  Assessment/Plan Impression is medial meniscal tear left knee plan arthroscopy and debridement risk benefits discussed with the patient including not limited to infection nerve vessel damage of prolonged rehabilitation incomplete pain relief no family history of DVT or pulmonary was all questions answered  DEAN,GREGORY SCOTT 09/12/2013, 11:37 AM

## 2013-09-12 NOTE — Anesthesia Procedure Notes (Addendum)
Anesthesia Regional Block:  Adductor canal block  Pre-Anesthetic Checklist: ,, timeout performed, Correct Patient, Correct Site, Correct Laterality, Correct Procedure, Correct Position, site marked, Risks and benefits discussed,  Surgical consent,  Pre-op evaluation,  At surgeon's request and post-op pain management  Laterality: Right  Prep: chloraprep       Needles:  Injection technique: Single-shot  Needle Type: Echogenic Stimulator Needle     Needle Length: 9cm 9 cm Needle Gauge: 21 and 21 G    Additional Needles:  Procedures: ultrasound guided (picture in chart) Adductor canal block Narrative:  Start time: 09/12/2013 10:20 AM End time: 09/12/2013 10:35 AM Injection made incrementally with aspirations every 5 mL.  Performed by: Personally  Anesthesiologist: Lillia Abed MD  Additional Notes: Monitors applied. Patient sedated. Sterile prep and drape,hand hygiene and sterile gloves were used. Relevant anatomy identified.Needle position confirmed.Local anesthetic injected incrementally after negative aspiration. Local anesthetic spread visualized around nerve(s). Vascular puncture avoided. No complications. Image printed for medical record.The patient tolerated the procedure well.    Lillia Abed MD   Procedure Name: LMA Insertion Date/Time: 09/12/2013 12:12 PM Performed by: Terrill Mohr Pre-anesthesia Checklist: Patient identified, Emergency Drugs available, Suction available and Patient being monitored Patient Re-evaluated:Patient Re-evaluated prior to inductionOxygen Delivery Method: Circle system utilized Preoxygenation: Pre-oxygenation with 100% oxygen Intubation Type: IV induction Ventilation: Mask ventilation without difficulty LMA: LMA inserted LMA Size: 4.0 Number of attempts: 1 Placement Confirmation: breath sounds checked- equal and bilateral and positive ETCO2 Tube secured with: Tape (taped across cheeks; gauze roll b/t upper gum and lower teeth) Dental  Injury: Teeth and Oropharynx as per pre-operative assessment

## 2013-09-12 NOTE — Brief Op Note (Signed)
09/12/2013  1:09 PM  PATIENT:  Hannah Madden  79 y.o. female  PRE-OPERATIVE DIAGNOSIS:  LEFT KNEE MEDIAL MENISCAL TEAR  POST-OPERATIVE DIAGNOSIS:  LEFT KNEE MEDIAL MENISCAL TEAR  PROCEDURE:  Procedure(s): KNEE ARTHROSCOPY WITH DEBRIDEMENT  SURGEON:  Surgeon(s): Meredith Pel, MD  ASSISTANT: carla bethune rnfa  ANESTHESIA:   general  EBL: 2 ml       BLOOD ADMINISTERED: none  DRAINS: none   LOCAL MEDICATIONS USED: marcaine ms04 clonidine  SPECIMEN:  No Specimen  COUNTS:  YES  TOURNIQUET:    DICTATION: .Other Dictation: Dictation Number [id number]  PLAN OF CARE: Discharge to home after PACU  PATIENT DISPOSITION:  PACU - hemodynamically stable

## 2013-09-12 NOTE — Op Note (Signed)
NAMELOVEAH, LIKE NO.:  [id number]  MEDICAL RECORD NO.:  [id number]  LOCATION:  5N12C                        FACILITY:  [facility]  PHYSICIAN:  Anderson Malta, M.D.    DATE OF BIRTH:  August 22, 1934  DATE OF PROCEDURE:  09/12/2013 DATE OF DISCHARGE:                              OPERATIVE REPORT   PREOPERATIVE DIAGNOSIS:  Left knee medial meniscal tear.  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  SURGICAL PROCEDURE:  Left knee diagnostic arthroscopy with partial medial meniscectomy.  SURGEON:  Anderson Malta, M.D.  ASSISTANT:  Laure Kidney, RNFA.  INDICATIONS:  Hannah Madden is a 79 year old patient with left knee pain, medial joint line tenderness.  MRI scan confirms medial meniscal tear. Presents for operative management after explanation of risks and benefits.  PROCEDURE IN DETAIL:  The patient was brought to the operating room where general anesthetic was induced.  Left leg was prescrubbed with alcohol and Betadine, allowed to air dry, prepped with DuraPrep solution, draped in sterile manner.  Left leg was examined under anesthesia, found to have following findings.  Full range of motion from 0-145 degrees of flexion.  Stability varus-valgus stress.  ACL and PCL intact.  No posterolateral rotatory instability is noted.  Following a time-out, leg was prepped with DuraPrep solution, draped in sterile manner.  Anterior, inferior, lateral and anterior, inferior, medial portals were anesthetized with Marcaine with epinephrine, about 7 mL. Anterior inferolateral portal was created.  Anterior inferior medial portal was created under direct visualization.  Diagnostic arthroscopy was performed.  The patient had a medial meniscal tear primarily radial, but degenerative in nature involving about 75% anterior-posterior width of the meniscus.  This was debrided back to a stable rim with a combination of basket punch  and shaver.  The patient also had some grade 2 to early grade 3 chondromalacia on the medial femoral condyle.  No exposed subchondral bone was present.  ACL and PCL were intact. Undersurface of the patella was intact.  In the distal aspect of the trochlea, there was some grade 2-3 chondromalacia over about a quarter- sized area.  On the lateral compartment, there were some fraying of the meniscus, which was debrided.  In general, the chondral surface on the lateral side had mild early grade 1 changes.  Following debridement of the posterior horn medial meniscal tear, thorough irrigation was performed on the joint. The instruments were removed.  The portals were closed using 3-0 nylon. Solution of Marcaine, morphine, clonidine was injected to the knee.  The patient tolerated the procedure well without immediate complications. Transferred to the recovery room in stable condition.     Anderson Malta, M.D.     GSD/MEDQ  D:  09/12/2013  T:  09/12/2013  Job:  [id number]

## 2013-09-12 NOTE — Transfer of Care (Signed)
Immediate Anesthesia Transfer of Care Note  Patient: Hannah Madden [facility]  Procedure(s) Performed: Procedure(s): KNEE ARTHROSCOPY WITH DEBRIDEMENT (Left)  Patient Location: PACU  Anesthesia Type:General  Level of Consciousness: awake, alert  and patient cooperative  Airway & Oxygen Therapy: Patient Spontanous Breathing and Patient connected to nasal cannula oxygen  Post-op Assessment: Report given to PACU RN, Post -op Vital signs reviewed and stable and Patient moving all extremities  Post vital signs: Reviewed and stable  Complications: No apparent anesthesia complications

## 2013-09-12 NOTE — Anesthesia Preprocedure Evaluation (Addendum)
Anesthesia Evaluation  Patient identified by MRN, date of birth, ID band Patient awake    Reviewed: Allergy & Precautions, H&P , NPO status , Patient's Chart, lab work & pertinent test results, reviewed documented beta blocker date and time   Airway Mallampati: II TM Distance: >3 FB Neck ROM: Full    Dental  (+) Edentulous Upper, Partial Lower, Dental Advisory Given   Pulmonary          Cardiovascular     Neuro/Psych    GI/Hepatic GERD-  Controlled and Medicated,  Endo/Other    Renal/GU      Musculoskeletal   Abdominal   Peds  Hematology   Anesthesia Other Findings   Reproductive/Obstetrics                           Anesthesia Physical Anesthesia Plan  ASA: II  Anesthesia Plan: General   Post-op Pain Management:    Induction: Intravenous  Airway Management Planned: LMA  Additional Equipment:   Intra-op Plan:   Post-operative Plan: Extubation in OR  Informed Consent: I have reviewed the patients History and Physical, chart, labs and discussed the procedure including the risks, benefits and alternatives for the proposed anesthesia with the patient or authorized representative who has indicated his/her understanding and acceptance.     Plan Discussed with: CRNA and Surgeon  Anesthesia Plan Comments:         Anesthesia Quick Evaluation

## 2013-09-12 NOTE — Anesthesia Postprocedure Evaluation (Signed)
Anesthesia Post Note  Patient: Hannah Madden [facility]  Procedure(s) Performed: Procedure(s) (LRB): KNEE ARTHROSCOPY WITH DEBRIDEMENT (Left)  Anesthesia type: general  Patient location: PACU  Post pain: Pain level controlled  Post assessment: Patient's Cardiovascular Status Stable  Last Vitals:  Filed Vitals:   09/12/13 1432  BP: 132/55  Pulse: 68  Temp: 36.3 C  Resp:     Post vital signs: Reviewed and stable  Level of consciousness: sedated  Complications: No apparent anesthesia complications

## 2013-09-13 ENCOUNTER — Encounter (HOSPITAL_COMMUNITY): Payer: Self-pay | Admitting: Orthopedic Surgery

## 2013-09-13 ENCOUNTER — Encounter: Payer: Medicare Other | Admitting: Family Medicine

## 2013-09-13 DIAGNOSIS — M23329 Other meniscus derangements, posterior horn of medial meniscus, unspecified knee: Secondary | ICD-10-CM | POA: Diagnosis not present

## 2013-09-13 MED ORDER — OXYCODONE HCL 5 MG PO TABS: 5.0000 mg | ORAL_TABLET | ORAL | Status: DC | PRN

## 2013-09-13 MED ORDER — ASPIRIN 325 MG PO TABS: 325.0000 mg | ORAL_TABLET | Freq: Every day | ORAL | Status: DC

## 2013-09-13 NOTE — Progress Notes (Signed)
Patient discharged to home accompanied by husband. Discharge instructions and rx given and explained and patient stated understanding. IV was removed and dressing was changed. Patient left unit in a stable condition with all personal belongings via wheelchair.

## 2013-09-13 NOTE — Evaluation (Addendum)
Physical Therapy Evaluation and Discharge Patient Details Name: Hannah Madden MRN: [id number] DOB: 1934/10/24 Today's Date: 09/13/2013   History of Present Illness  79 y.o. female s/p KNEE ARTHROSCOPY WITH DEBRIDEMENT (Left)  Clinical Impression  Patient evaluated by Physical Therapy with no further acute PT needs identified. All education has been completed and the patient has no further questions. Patient participated in gait and stair training this AM; safely performs these tasks at a supervision level. She will have 24 hour care at home from family. See below for any follow-up Physial Therapy or equipment needs. PT is signing off. Thank you for this referral.     Follow Up Recommendations Outpatient PT    Equipment Recommendations  None recommended by PT    Recommendations for Other Services       Precautions / Restrictions Precautions Precautions: Knee Precaution Comments: Reviewed knee precautions Restrictions Weight Bearing Restrictions: Yes LLE Weight Bearing: Weight bearing as tolerated      Mobility  Bed Mobility Overal bed mobility: Modified Independent                Transfers Overall transfer level: Needs assistance Equipment used: Rolling walker (2 wheeled) Transfers: Sit to/from Stand Sit to Stand: Supervision         General transfer comment: Supervision for safety with education on safe technique and hand placement.  Ambulation/Gait Ambulation/Gait assistance: Supervision Ambulation Distance (Feet): 100 Feet Assistive device: Rolling walker (2 wheeled) Gait Pattern/deviations: Step-to pattern;Step-through pattern;Decreased step length - right;Decreased stance time - left;Antalgic   Gait velocity interpretation: Below normal speed for age/gender General Gait Details: Educated on safe DME use. VC for upright posture and forward gaze. Instructions for left knee extension in stance phase for quad activation and terminal knee  extension.  Stairs Stairs: Yes Stairs assistance: Supervision Stair Management: One rail Left;Step to pattern;Sideways Number of Stairs: 12 General stair comments: Education for safe stair navigation. Able to safely ascend/descen full flight with VC for sequencing and foot placement. Pt teach back and verbalizes correct technique. States she feels confident with this task  Wheelchair Mobility    Modified Rankin (Stroke Patients Only)       Balance Overall balance assessment: Needs assistance Sitting-balance support: No upper extremity supported;Feet supported Sitting balance-Leahy Scale: Good     Standing balance support: No upper extremity supported Standing balance-Leahy Scale: Fair                               Pertinent Vitals/Pain Pt with no complaints of pain but states she has severe nausea Nurse notified Patient repositioned in bed for comfort.     Home Living Family/patient expects to be discharged to:: Private residence Living Arrangements: Spouse/significant other;Children Available Help at Discharge: Family;Available 24 hours/day Type of Home: House Home Access: Stairs to enter Entrance Stairs-Rails: Psychiatric nurse of Steps: 12 Home Layout: Two level;Able to live on main level with bedroom/bathroom Home Equipment: Gilford Rile - 2 wheels      Prior Function Level of Independence: Independent               Hand Dominance   Dominant Hand: Right    Extremity/Trunk Assessment   Upper Extremity Assessment: Defer to OT evaluation           Lower Extremity Assessment: LLE deficits/detail   LLE Deficits / Details: decreased strength and ROM as expected post op     Communication   Communication:  No difficulties  Cognition Arousal/Alertness: Awake/alert Behavior During Therapy: WFL for tasks assessed/performed Overall Cognitive Status: Within Functional Limits for tasks assessed                       General Comments General comments (skin integrity, edema, etc.): Pt reports severe nausea (-) emesis. Nurse notified    Exercises General Exercises - Lower Extremity Ankle Circles/Pumps: AROM;Both;10 reps;Supine Quad Sets: Both;AROM;10 reps;Supine      Assessment/Plan    PT Assessment All further PT needs can be met in the next venue of care  PT Diagnosis Abnormality of gait;Acute pain   PT Problem List Decreased strength;Decreased range of motion;Decreased activity tolerance;Decreased balance;Decreased mobility;Pain  PT Treatment Interventions     PT Goals (Current goals can be found in the Care Plan section) Acute Rehab PT Goals Patient Stated Goal: go home today PT Goal Formulation: No goals set, d/c therapy    Frequency     Barriers to discharge        Co-evaluation               End of Session   Activity Tolerance: Patient tolerated treatment well Patient left: in bed;with call bell/phone within reach Nurse Communication: Mobility status;Other (comment) (nausea)    Functional Assessment Tool Used: clinical observation Functional Limitation: Mobility: Walking and moving around Mobility: Walking and Moving Around Current Status [phone]): At least 1 percent but less than 20 percent impaired, limited or restricted Mobility: Walking and Moving Around Goal Status [phone]): At least 1 percent but less than 20 percent impaired, limited or restricted Mobility: Walking and Moving Around Discharge Status [phone]): At least 1 percent but less than 20 percent impaired, limited or restricted    Time: 1023-1044 PT Time Calculation (min): 21 min   Charges:   PT Evaluation $Initial PT Evaluation Tier I: 1 Procedure PT Treatments $Gait Training: 8-22 mins   PT G Codes:   Functional Assessment Tool Used: clinical observation Functional Limitation: Mobility: Walking and moving around  [place], [place]   Ellouise Newer 09/13/2013, 11:24  AM    Addendum for title (Discharge)

## 2013-09-13 NOTE — Progress Notes (Signed)
Subjective: Pt stable - pain ok   Objective: Vital signs in last 24 hours: Temp:  [97.4 F (36.3 C)-98.5 F (36.9 C)] 97.9 F (36.6 C) (07/14 0451) Pulse Rate:  [56-97] 71 (07/14 0451) Resp:  [9-20] 18 (07/14 0451) BP: (92-146)/(43-83) 104/58 mmHg (07/14 0451) SpO2:  [98 %-100 %] 98 % (07/14 0451) Weight:  [66.906 kg (147 lb 8 oz)] 66.906 kg (147 lb 8 oz) (07/13 0922)  Intake/Output from previous day: 07/13 0701 - 07/14 0700 In: 1525 [I.V.:1525] Out: 15 [Blood:15] Intake/Output this shift:    Exam:  Dorsiflexion/Plantar flexion intact  Labs: No results found for this basename: HGB,  in the last 72 hours No results found for this basename: WBC, RBC, HCT, PLT,  in the last 72 hours No results found for this basename: NA, K, CL, CO2, BUN, CREATININE, GLUCOSE, CALCIUM,  in the last 72 hours No results found for this basename: LABPT, INR,  in the last 72 hours  Assessment/Plan: Plan PT this am - wbat  rom as tolerated with walker  change dressing to mepilex   Hannah Madden SCOTT 09/13/2013, 7:14 AM

## 2013-09-19 ENCOUNTER — Encounter: Payer: Medicare Other | Admitting: Family Medicine

## 2013-09-19 ENCOUNTER — Other Ambulatory Visit (INDEPENDENT_AMBULATORY_CARE_PROVIDER_SITE_OTHER): Payer: Medicare Other

## 2013-09-19 DIAGNOSIS — I10 Essential (primary) hypertension: Secondary | ICD-10-CM | POA: Diagnosis not present

## 2013-09-19 DIAGNOSIS — E039 Hypothyroidism, unspecified: Secondary | ICD-10-CM | POA: Diagnosis not present

## 2013-09-19 DIAGNOSIS — D649 Anemia, unspecified: Secondary | ICD-10-CM

## 2013-09-19 DIAGNOSIS — E785 Hyperlipidemia, unspecified: Secondary | ICD-10-CM

## 2013-09-19 LAB — HEPATIC FUNCTION PANEL
ALBUMIN: 3.9 g/dL (ref 3.5–5.2)
ALT: 15 U/L (ref 0–35)
AST: 18 U/L (ref 0–37)
Alkaline Phosphatase: 82 U/L (ref 39–117)
Bilirubin, Direct: 0.1 mg/dL (ref 0.0–0.3)
Total Bilirubin: 0.2 mg/dL (ref 0.2–1.2)
Total Protein: 6.7 g/dL (ref 6.0–8.3)

## 2013-09-19 LAB — LIPID PANEL
CHOLESTEROL: 159 mg/dL (ref 0–200)
HDL: 48 mg/dL (ref >39.00)
LDL CALC: 96 mg/dL (ref 0–99)
NonHDL: 111
Total CHOL/HDL Ratio: 3
Triglycerides: 74 mg/dL (ref 0.0–149.0)
VLDL: 14.8 mg/dL (ref 0.0–40.0)

## 2013-09-19 LAB — CBC WITH DIFFERENTIAL/PLATELET
Basophils Absolute: 0 10*3/uL (ref 0.0–0.1)
Basophils Relative: 0.3 % (ref 0.0–3.0)
EOS ABS: 0.2 10*3/uL (ref 0.0–0.7)
EOS PCT: 2.5 % (ref 0.0–5.0)
HEMATOCRIT: 35.3 % — AB (ref 36.0–46.0)
HEMOGLOBIN: 11.5 g/dL — AB (ref 12.0–15.0)
LYMPHS ABS: 2.7 10*3/uL (ref 0.7–4.0)
Lymphocytes Relative: 39 % (ref 12.0–46.0)
MCHC: 32.7 g/dL (ref 30.0–36.0)
MCV: 88.2 fl (ref 78.0–100.0)
MONO ABS: 0.6 10*3/uL (ref 0.1–1.0)
Monocytes Relative: 7.9 % (ref 3.0–12.0)
NEUTROS ABS: 3.5 10*3/uL (ref 1.4–7.7)
NEUTROS PCT: 50.3 % (ref 43.0–77.0)
Platelets: 225 10*3/uL (ref 150.0–400.0)
RBC: 4 Mil/uL (ref 3.87–5.11)
RDW: 13.8 % (ref 11.5–15.5)
WBC: 7 10*3/uL (ref 4.0–10.5)

## 2013-09-19 LAB — BASIC METABOLIC PANEL
BUN: 12 mg/dL (ref 6–23)
CHLORIDE: 106 meq/L (ref 96–112)
CO2: 26 meq/L (ref 19–32)
CREATININE: 0.9 mg/dL (ref 0.4–1.2)
Calcium: 9 mg/dL (ref 8.4–10.5)
GFR: 79.15 mL/min (ref >60.00)
Glucose, Bld: 97 mg/dL (ref 70–99)
POTASSIUM: 4.4 meq/L (ref 3.5–5.1)
Sodium: 138 mEq/L (ref 135–145)

## 2013-09-19 LAB — POCT URINALYSIS DIPSTICK
Bilirubin, UA: NEGATIVE
Blood, UA: NEGATIVE
Glucose, UA: NEGATIVE
KETONES UA: NEGATIVE
Nitrite, UA: NEGATIVE
PROTEIN UA: NEGATIVE
Spec Grav, UA: 1.015
UROBILINOGEN UA: 0.2
pH, UA: 5.5

## 2013-09-19 LAB — TSH: TSH: 1.58 u[IU]/mL (ref 0.35–4.50)

## 2013-09-20 ENCOUNTER — Encounter: Payer: Medicare Other | Admitting: Family Medicine

## 2013-09-26 ENCOUNTER — Ambulatory Visit (INDEPENDENT_AMBULATORY_CARE_PROVIDER_SITE_OTHER): Payer: Medicare Other | Admitting: Family Medicine

## 2013-09-26 ENCOUNTER — Encounter: Payer: Self-pay | Admitting: Family Medicine

## 2013-09-26 VITALS — BP 110/70 | HR 74 | Temp 98.4°F | Ht 64.0 in | Wt 150.0 lb

## 2013-09-26 DIAGNOSIS — K219 Gastro-esophageal reflux disease without esophagitis: Secondary | ICD-10-CM

## 2013-09-26 DIAGNOSIS — E7800 Pure hypercholesterolemia, unspecified: Secondary | ICD-10-CM

## 2013-09-26 DIAGNOSIS — Z0000 Encounter for general adult medical examination without abnormal findings: Secondary | ICD-10-CM

## 2013-09-26 NOTE — Patient Instructions (Signed)
Fat and Cholesterol Control Diet Fat and cholesterol levels in your blood and organs are influenced by your diet. High levels of fat and cholesterol may lead to diseases of the heart, small and large blood vessels, gallbladder, liver, and pancreas. CONTROLLING FAT AND CHOLESTEROL WITH DIET Although exercise and lifestyle factors are important, your diet is key. That is because certain foods are known to raise cholesterol and others to lower it. The goal is to balance foods for their effect on cholesterol and more importantly, to replace saturated and trans fat with other types of fat, such as monounsaturated fat, polyunsaturated fat, and omega-3 fatty acids. On average, a person should consume no more than 15 to 17 g of saturated fat daily. Saturated and trans fats are considered "bad" fats, and they will raise LDL cholesterol. Saturated fats are primarily found in animal products such as meats, butter, and cream. However, that does not mean you need to give up all your favorite foods. Today, there are good tasting, low-fat, low-cholesterol substitutes for most of the things you like to eat. Choose low-fat or nonfat alternatives. Choose round or loin cuts of red meat. These types of cuts are lowest in fat and cholesterol. Chicken (without the skin), fish, veal, and ground turkey breast are great choices. Eliminate fatty meats, such as hot dogs and salami. Even shellfish have little or no saturated fat. Have a 3 oz (85 g) portion when you eat lean meat, poultry, or fish. Trans fats are also called "partially hydrogenated oils." They are oils that have been scientifically manipulated so that they are solid at room temperature resulting in a longer shelf life and improved taste and texture of foods in which they are added. Trans fats are found in stick margarine, some tub margarines, cookies, crackers, and baked goods.  When baking and cooking, oils are a great substitute for butter. The monounsaturated oils are  especially beneficial since it is believed they lower LDL and raise HDL. The oils you should avoid entirely are saturated tropical oils, such as coconut and palm.  Remember to eat a lot from food groups that are naturally free of saturated and trans fat, including fish, fruit, vegetables, beans, grains (barley, rice, couscous, bulgur wheat), and pasta (without cream sauces).  IDENTIFYING FOODS THAT LOWER FAT AND CHOLESTEROL  Soluble fiber may lower your cholesterol. This type of fiber is found in fruits such as apples, vegetables such as broccoli, potatoes, and carrots, legumes such as beans, peas, and lentils, and grains such as barley. Foods fortified with plant sterols (phytosterol) may also lower cholesterol. You should eat at least 2 g per day of these foods for a cholesterol lowering effect.  Read package labels to identify low-saturated fats, trans fat free, and low-fat foods at the supermarket. Select cheeses that have only 2 to 3 g saturated fat per ounce. Use a heart-healthy tub margarine that is free of trans fats or partially hydrogenated oil. When buying baked goods (cookies, crackers), avoid partially hydrogenated oils. Breads and muffins should be made from whole grains (whole-wheat or whole oat flour, instead of "flour" or "enriched flour"). Buy non-creamy canned soups with reduced salt and no added fats.  FOOD PREPARATION TECHNIQUES  Never deep-fry. If you must fry, either stir-fry, which uses very little fat, or use non-stick cooking sprays. When possible, broil, bake, or roast meats, and steam vegetables. Instead of putting butter or margarine on vegetables, use lemon and herbs, applesauce, and cinnamon (for squash and sweet potatoes). Use nonfat   yogurt, salsa, and low-fat dressings for salads.  LOW-SATURATED FAT / LOW-FAT FOOD SUBSTITUTES Meats / Saturated Fat (g)  Avoid: Steak, marbled (3 oz/85 g) / 11 g  Choose: Steak, lean (3 oz/85 g) / 4 g  Avoid: Hamburger (3 oz/85 g) / 7  g  Choose: Hamburger, lean (3 oz/85 g) / 5 g  Avoid: Ham (3 oz/85 g) / 6 g  Choose: Ham, lean cut (3 oz/85 g) / 2.4 g  Avoid: Chicken, with skin, dark meat (3 oz/85 g) / 4 g  Choose: Chicken, skin removed, dark meat (3 oz/85 g) / 2 g  Avoid: Chicken, with skin, light meat (3 oz/85 g) / 2.5 g  Choose: Chicken, skin removed, light meat (3 oz/85 g) / 1 g Dairy / Saturated Fat (g)  Avoid: Whole milk (1 cup) / 5 g  Choose: Low-fat milk, 2% (1 cup) / 3 g  Choose: Low-fat milk, 1% (1 cup) / 1.5 g  Choose: Skim milk (1 cup) / 0.3 g  Avoid: Hard cheese (1 oz/28 g) / 6 g  Choose: Skim milk cheese (1 oz/28 g) / 2 to 3 g  Avoid: Cottage cheese, 4% fat (1 cup) / 6.5 g  Choose: Low-fat cottage cheese, 1% fat (1 cup) / 1.5 g  Avoid: Ice cream (1 cup) / 9 g  Choose: Sherbet (1 cup) / 2.5 g  Choose: Nonfat frozen yogurt (1 cup) / 0.3 g  Choose: Frozen fruit bar / trace  Avoid: Whipped cream (1 tbs) / 3.5 g  Choose: Nondairy whipped topping (1 tbs) / 1 g Condiments / Saturated Fat (g)  Avoid: Mayonnaise (1 tbs) / 2 g  Choose: Low-fat mayonnaise (1 tbs) / 1 g  Avoid: Butter (1 tbs) / 7 g  Choose: Extra light margarine (1 tbs) / 1 g  Avoid: Coconut oil (1 tbs) / 11.8 g  Choose: Olive oil (1 tbs) / 1.8 g  Choose: Corn oil (1 tbs) / 1.7 g  Choose: Safflower oil (1 tbs) / 1.2 g  Choose: Sunflower oil (1 tbs) / 1.4 g  Choose: Soybean oil (1 tbs) / 2.4 g  Choose: Canola oil (1 tbs) / 1 g Document Released: 02/17/2005 Document Revised: 06/14/2012 Document Reviewed: 05/18/2013 ExitCare Patient Information 2015 [place], [place]. This information is not intended to replace advice given to you by your health care provider. Make sure you discuss any questions you have with your health care provider. Pneumococcal Vaccine, Polyvalent suspension for injection What is this medicine? PNEUMOCOCCAL VACCINE, POLYVALENT (NEU mo KOK al vak SEEN, pol ee VEY luhnt) is a vaccine to prevent  pneumococcus bacteria infection. These bacteria are a major cause of ear infections, 'Strep throat' infections, and serious pneumonia, meningitis, or blood infections worldwide. These vaccines help the body to produce antibodies (protective substances) that help your body defend against these bacteria. This vaccine is recommended for infants and young children. This vaccine will not treat an infection. This medicine may be used for other purposes; ask your health care provider or pharmacist if you have questions. COMMON BRAND NAME(S): Prevnar 13 What should I tell my health care provider before I take this medicine? They need to know if you have any of these conditions: -bleeding problems -fever -immune system problems -low platelet count in the blood -seizures -an unusual or allergic reaction to pneumococcal vaccine, diphtheria toxoid, other vaccines, latex, other medicines, foods, dyes, or preservatives -pregnant or trying to get pregnant -breast-feeding How should I use this medicine? This  vaccine is for injection into a muscle. It is given by a health care professional. A copy of Vaccine Information Statements will be given before each vaccination. Read this sheet carefully each time. The sheet may change frequently. Talk to your pediatrician regarding the use of this medicine in children. While this drug may be prescribed for children as young as 31 weeks old for selected conditions, precautions do apply. Overdosage: If you think you have taken too much of this medicine contact a poison control center or emergency room at once. NOTE: This medicine is only for you. Do not share this medicine with others. What if I miss a dose? It is important not to miss your dose. Call your doctor or health care professional if you are unable to keep an appointment. What may interact with this medicine? -medicines for cancer chemotherapy -medicines that suppress your immune function -medicines that treat  or prevent blood clots like warfarin, enoxaparin, and dalteparin -steroid medicines like prednisone or cortisone This list may not describe all possible interactions. Give your health care provider a list of all the medicines, herbs, non-prescription drugs, or dietary supplements you use. Also tell them if you smoke, drink alcohol, or use illegal drugs. Some items may interact with your medicine. What should I watch for while using this medicine? Mild fever and pain should go away in 3 days or less. Report any unusual symptoms to your doctor or health care professional. What side effects may I notice from receiving this medicine? Side effects that you should report to your doctor or health care professional as soon as possible: -allergic reactions like skin rash, itching or hives, swelling of the face, lips, or tongue -breathing problems -confused -fever over 102 degrees F -pain, tingling, numbness in the hands or feet -seizures -unusual bleeding or bruising -unusual muscle weakness Side effects that usually do not require medical attention (report to your doctor or health care professional if they continue or are bothersome): -aches and pains -diarrhea -fever of 102 degrees F or less -headache -irritable -loss of appetite -pain, tender at site where injected -trouble sleeping This list may not describe all possible side effects. Call your doctor for medical advice about side effects. You may report side effects to FDA at 1-800-FDA-1088. Where should I keep my medicine? This does not apply. This vaccine is given in a clinic, pharmacy, doctor's office, or other health care setting and will not be stored at home. NOTE: This sheet is a summary. It may not cover all possible information. If you have questions about this medicine, talk to your doctor, pharmacist, or health care provider.  2015, [employer]. (2008-05-02 10:17:22)

## 2013-09-26 NOTE — Progress Notes (Signed)
Subjective:    Patient ID: Hannah Madden, female    DOB: 1934/07/20, 79 y.o.   MRN: [id number]  HPI Patient seen for medical followup and wellness exam. She has hyperlipidemia treated with simvastatin. She is considering coming off this. She's had some leg cramps that she attributes to simvastatin. She has history of GERD which is controlled with Nexium. No dysphagia.  Takes her Nexium regularly. She exercises regularly. She had arthroscopic left knee surgery couple weeks ago and is healing well from that. She has declined pneumonia vaccine and flu vaccine in the past. She also declined shingles vaccine. She still sees gynecologist. Gets yearly mammograms. Colonoscopy up to date.  Past Medical History  Diagnosis Date  . Alopecia     stress related and resolved since her Mother moved to SNF  . Glaucoma     uses eye drops daily  . Hypercholesteremia     takes Simvastatin daily  . GERD (gastroesophageal reflux disease)     takes Nexium daily  . Pneumonia 1960  . H/O hiatal hernia    Past Surgical History  Procedure Laterality Date  . Abdominal hysterectomy      fibroids.  . Colonoscopy    . Knee arthroscopy with medial menisectomy Left 09/12/2013    Procedure: KNEE ARTHROSCOPY WITH DEBRIDEMENT;  Surgeon: Meredith Pel, MD;  Location: [facility];  Service: Orthopedics;  Laterality: Left;    reports that she has never smoked. She has never used smokeless tobacco. She reports that she does not drink alcohol or use illicit drugs. family history is negative for COPD, Diabetes, Heart disease, and Stroke. Allergies  Allergen Reactions  . Atorvastatin     REACTION: INTOL to Lipitor w/ leg pain  . Penicillins     REACTION: hives   1.  Risk factors based on Past Medical , Social, and Family history reviewed and as indicated above with no changes 2.  Limitations in physical activities None.  No recent falls. 3.  Depression/mood No active depression or anxiety issues 4.  Hearing No  defiits 5.  ADLs independent in all. 6.  Cognitive function (orientation to time and place, language, writing, speech,memory) no short or long term memory issues.  Language and judgement intact. 7.  Home Safety no issues 8.  Height, weight, and visual acuity.all stable. 9.  Counseling discussed ongoing weight bearing exercise, fall prevention. 10. Recommendation of preventive services. Prevnar 13 and Shingles vaccines declined. 11. Labs based on risk factors recent labs-lipid, hepatic, BMP, TSH, CBC reviewed. 12. Care Plan recommend yearly flu vaccine. Tetanus booster in 5 years. 13. Other Providers Dr Marlou Sa (orthopedics) 14. Written schedule of screening/prevention services given to patient.    Review of Systems  Constitutional: Negative for fever, activity change, appetite change, fatigue and unexpected weight change.  HENT: Negative for ear pain, hearing loss, sore throat and trouble swallowing.   Eyes: Negative for visual disturbance.  Respiratory: Negative for cough and shortness of breath.   Cardiovascular: Negative for chest pain and palpitations.  Gastrointestinal: Negative for abdominal pain, diarrhea, constipation and blood in stool.  Endocrine: Negative for polydipsia and polyuria.  Genitourinary: Negative for dysuria and hematuria.  Musculoskeletal: Negative for arthralgias, back pain and myalgias.  Skin: Negative for rash.  Neurological: Negative for dizziness, syncope and headaches.  Hematological: Negative for adenopathy.  Psychiatric/Behavioral: Negative for confusion and dysphoric mood.       Objective:   Physical Exam  Constitutional: She is oriented to person, place, and time.  She appears well-developed and well-nourished.  HENT:  Head: Normocephalic and atraumatic.  Eyes: EOM are normal. Pupils are equal, round, and reactive to light.  Neck: Normal range of motion. Neck supple. No thyromegaly present.  Cardiovascular: Normal rate, regular rhythm and normal  heart sounds.   No murmur heard. Pulmonary/Chest: Breath sounds normal. No respiratory distress. She has no wheezes. She has no rales.  Abdominal: Soft. Bowel sounds are normal. She exhibits no distension and no mass. There is no tenderness. There is no rebound and no guarding.  Genitourinary:  Per GYN  Musculoskeletal: Normal range of motion. She exhibits no edema.  Lymphadenopathy:    She has no cervical adenopathy.  Neurological: She is alert and oriented to person, place, and time. She displays normal reflexes. No cranial nerve deficit.  Skin: No rash noted.  Psychiatric: She has a normal mood and affect. Her behavior is normal. Judgment and thought content normal.          Assessment & Plan:  #1 complete physical. We've recommended yearly flu vaccine. We recommend pneumonia vaccine and she will consider. Information given. She declines shingles vaccine. She will continue GYN followup. #2 hyperlipidemia. Had discussion regarding pros and cons of treatment. She declines further statin therapy. Will follow low saturated diet diligently and recheck fasting lipids in 6 months #3 GERD which has been well controlled. Continue Nexium

## 2013-09-26 NOTE — Progress Notes (Signed)
Pre visit review using our clinic review tool, if applicable. No additional management support is needed unless otherwise documented below in the visit note. 

## 2013-09-27 ENCOUNTER — Encounter (HOSPITAL_COMMUNITY): Payer: Self-pay | Admitting: Orthopedic Surgery

## 2013-09-27 NOTE — Addendum Note (Signed)
Addendum created 09/27/13 1301 by Lillia Abed, MD   Modules edited: Anesthesia Events

## 2013-10-06 NOTE — Discharge Summary (Signed)
Physician Discharge Summary  Patient ID: Hannah Madden MRN: [id number] DOB/AGE: [birth year] 79 y.o.  Admit date: 09/12/2013 Discharge date: 09/13/2013 Admission Diagnoses:  Active Problems:   Acute medial meniscal tear   Discharge Diagnoses:  Same  Surgeries: Procedure(s): KNEE ARTHROSCOPY WITH DEBRIDEMENT on 09/12/2013   Consultants:    Discharged Condition: Stable  Hospital Course: Hannah Madden is an 79 y.o. female who was admitted 09/12/2013 with a chief complaint of knee pain, and found to have a diagnosis of meniscal tear.  They were brought to the operating room on 09/12/2013 and underwent the above named procedures.She was seen by PT pod 1 and did well wbat.    Antibiotics given:  Anti-infectives   Start     Dose/Rate Route Frequency Ordered Stop   09/12/13 1830  clindamycin (CLEOCIN) IVPB 600 mg     600 mg 100 mL/hr over 30 Minutes Intravenous Every 6 hours 09/12/13 1422 09/12/13 1835   09/12/13 0600  clindamycin (CLEOCIN) IVPB 900 mg     900 mg 100 mL/hr over 30 Minutes Intravenous On call to O.R. 09/11/13 1411 09/12/13 1248    .  Recent vital signs:  Filed Vitals:   09/13/13 0451  BP: 104/58  Pulse: 71  Temp: 97.9 F (36.6 C)  Resp: 18    Recent laboratory studies:  Results for orders placed during the hospital encounter of 62/70/35  BASIC METABOLIC PANEL      Result Value Ref Range   Sodium 138  137 - 147 mEq/L   Potassium 4.8  3.7 - 5.3 mEq/L   Chloride 102  96 - 112 mEq/L   CO2 24  19 - 32 mEq/L   Glucose, Bld 103 (*) 70 - 99 mg/dL   BUN 10  6 - 23 mg/dL   Creatinine, Ser 0.73  0.50 - 1.10 mg/dL   Calcium 9.1  8.4 - 10.5 mg/dL   GFR calc non Af Amer 81 (*) >90 mL/min   GFR calc Af Amer >90  >90 mL/min   Anion gap 12  5 - 15  CBC      Result Value Ref Range   WBC 5.8  4.0 - 10.5 K/uL   RBC 3.98  3.87 - 5.11 MIL/uL   Hemoglobin 11.4 (*) 12.0 - 15.0 g/dL   HCT 34.8 (*) 36.0 - 46.0 %   MCV 87.4  78.0 - 100.0 fL   MCH 28.6  26.0 - 34.0 pg    MCHC 32.8  30.0 - 36.0 g/dL   RDW 13.5  11.5 - 15.5 %   Platelets 240  150 - 400 K/uL    Discharge Medications:     Medication List         bimatoprost 0.01 % Soln  Commonly known as:  LUMIGAN  Place 1 drop into both eyes at bedtime.     brinzolamide 1 % ophthalmic suspension  Commonly known as:  AZOPT  Place 1 drop into both eyes 2 (two) times daily.     COMBIGAN 0.2-0.5 % ophthalmic solution  Generic drug:  brimonidine-timolol  Place 1 drop into both eyes 2 (two) times daily.     CoQ10 100 MG Caps  Take 100 mg by mouth daily.     esomeprazole 40 MG capsule  Commonly known as:  NEXIUM  Take 40 mg by mouth daily at 12 noon.     Garlic 009 MG Tabs  Take 100 mg by mouth daily.     Magnesium 500 MG Tabs  Take 1 tablet by mouth daily.     vitamin C 500 MG tablet  Commonly known as:  ASCORBIC ACID  Take 1,000 mg by mouth daily.     vitamin E 400 UNIT capsule  Take 400 Units by mouth daily.        Diagnostic Studies: No results found.  Disposition: 01-Home or Self Care      Discharge Instructions   Call MD / Call 911    Complete by:  As directed   If you experience chest pain or shortness of breath, CALL 911 and be transported to the hospital emergency room.  If you develope a fever above 101 F, pus (white drainage) or increased drainage or redness at the wound, or calf pain, call your surgeon's office.     Constipation Prevention    Complete by:  As directed   Drink plenty of fluids.  Prune juice may be helpful.  You may use a stool softener, such as Colace (over the counter) 100 mg twice a day.  Use MiraLax (over the counter) for constipation as needed.     Diet - low sodium heart healthy    Complete by:  As directed      Discharge instructions    Complete by:  As directed   Weight bearing as tolerated with walker Keep incisions dry Continue knee range of motion exercises and straight leg raises 30 reps each 3 times a day     Increase activity slowly as  tolerated    Complete by:  As directed               Signed: DEAN,GREGORY SCOTT 10/06/2013, 5:02 PM

## 2013-10-24 ENCOUNTER — Other Ambulatory Visit: Payer: Self-pay

## 2013-10-24 DIAGNOSIS — Z1231 Encounter for screening mammogram for malignant neoplasm of breast: Secondary | ICD-10-CM

## 2013-11-02 DIAGNOSIS — H4011X0 Primary open-angle glaucoma, stage unspecified: Secondary | ICD-10-CM | POA: Diagnosis not present

## 2013-11-02 DIAGNOSIS — H40039 Anatomical narrow angle, unspecified eye: Secondary | ICD-10-CM | POA: Diagnosis not present

## 2013-11-02 DIAGNOSIS — H31099 Other chorioretinal scars, unspecified eye: Secondary | ICD-10-CM | POA: Diagnosis not present

## 2013-11-02 DIAGNOSIS — H43399 Other vitreous opacities, unspecified eye: Secondary | ICD-10-CM | POA: Diagnosis not present

## 2013-11-02 DIAGNOSIS — H2510 Age-related nuclear cataract, unspecified eye: Secondary | ICD-10-CM | POA: Diagnosis not present

## 2013-11-09 ENCOUNTER — Ambulatory Visit
Admission: RE | Admit: 2013-11-09 | Discharge: 2013-11-09 | Disposition: A | Discharge status: Home / Self Care | Payer: Medicare Other | Source: Ambulatory Visit

## 2013-11-09 DIAGNOSIS — Z1231 Encounter for screening mammogram for malignant neoplasm of breast: Secondary | ICD-10-CM

## 2013-11-16 ENCOUNTER — Telehealth: Payer: Self-pay | Admitting: Family Medicine

## 2013-11-16 NOTE — Telephone Encounter (Signed)
Who d/ced?  Is she taking regularly?  At her age would be ideal not to take regularly secondary to risk of falls.  If she has been taking every day for years, refill OK.

## 2013-11-16 NOTE — Telephone Encounter (Signed)
[id number] - [place], [place] - [address] AT [address] is requesting re-fill on clorazepate (TRANXENE) 7.5 MG tablet Medication was d/c on 08/29/13

## 2013-11-16 NOTE — Telephone Encounter (Signed)
Is it okay to refill for patient. The medication is currently not on pt medication list.

## 2013-11-17 MED ORDER — CLORAZEPATE DIPOTASSIUM 7.5 MG PO TABS: 7.5000 mg | ORAL_TABLET | Freq: Three times a day (TID) | ORAL | Status: DC

## 2013-11-17 NOTE — Telephone Encounter (Signed)
RX sent to pharmacy  

## 2013-11-25 DIAGNOSIS — H4011X0 Primary open-angle glaucoma, stage unspecified: Secondary | ICD-10-CM | POA: Diagnosis not present

## 2013-12-06 DIAGNOSIS — H4011X2 Primary open-angle glaucoma, moderate stage: Secondary | ICD-10-CM | POA: Diagnosis not present

## 2013-12-16 DIAGNOSIS — H4011X3 Primary open-angle glaucoma, severe stage: Secondary | ICD-10-CM | POA: Diagnosis not present

## 2013-12-16 DIAGNOSIS — H2513 Age-related nuclear cataract, bilateral: Secondary | ICD-10-CM | POA: Diagnosis not present

## 2014-01-13 DIAGNOSIS — H34811 Central retinal vein occlusion, right eye: Secondary | ICD-10-CM | POA: Diagnosis not present

## 2014-01-13 DIAGNOSIS — H3581 Retinal edema: Secondary | ICD-10-CM | POA: Diagnosis not present

## 2014-01-13 DIAGNOSIS — H43812 Vitreous degeneration, left eye: Secondary | ICD-10-CM | POA: Diagnosis not present

## 2014-01-20 DIAGNOSIS — H34811 Central retinal vein occlusion, right eye: Secondary | ICD-10-CM | POA: Diagnosis not present

## 2014-01-20 DIAGNOSIS — H3581 Retinal edema: Secondary | ICD-10-CM | POA: Diagnosis not present

## 2014-02-01 ENCOUNTER — Ambulatory Visit: Payer: Medicare Other | Admitting: Family Medicine

## 2014-02-01 DIAGNOSIS — J069 Acute upper respiratory infection, unspecified: Secondary | ICD-10-CM | POA: Diagnosis not present

## 2014-02-13 DIAGNOSIS — H4011X2 Primary open-angle glaucoma, moderate stage: Secondary | ICD-10-CM | POA: Diagnosis not present

## 2014-03-13 ENCOUNTER — Telehealth: Payer: Self-pay | Admitting: Family Medicine

## 2014-03-13 MED ORDER — ESOMEPRAZOLE MAGNESIUM 40 MG PO CPDR: 40.0000 mg | DELAYED_RELEASE_CAPSULE | Freq: Every day | ORAL | Status: DC

## 2014-03-13 NOTE — Telephone Encounter (Signed)
Rx sent to mail order

## 2014-03-13 NOTE — Telephone Encounter (Signed)
Pt needs new rx  nexium 40 mg #90 w/[facility]

## 2014-03-29 ENCOUNTER — Ambulatory Visit: Payer: Medicare Other | Admitting: Family Medicine

## 2014-03-31 ENCOUNTER — Telehealth: Payer: Self-pay | Admitting: *Deleted

## 2014-03-31 MED ORDER — SIMVASTATIN 20 MG PO TABS: 20.0000 mg | ORAL_TABLET | Freq: Every day | ORAL | Status: DC

## 2014-03-31 NOTE — Telephone Encounter (Signed)
Rx sent to pharmacy   

## 2014-03-31 NOTE — Telephone Encounter (Signed)
[facility] SImvastatin 20mg -1 by mouth every evening-#30.

## 2014-04-03 DIAGNOSIS — H34811 Central retinal vein occlusion, right eye: Secondary | ICD-10-CM | POA: Diagnosis not present

## 2014-04-03 DIAGNOSIS — H3581 Retinal edema: Secondary | ICD-10-CM | POA: Diagnosis not present

## 2014-04-03 DIAGNOSIS — H43812 Vitreous degeneration, left eye: Secondary | ICD-10-CM | POA: Diagnosis not present

## 2014-04-11 DIAGNOSIS — H34811 Central retinal vein occlusion, right eye: Secondary | ICD-10-CM | POA: Diagnosis not present

## 2014-04-11 DIAGNOSIS — H3581 Retinal edema: Secondary | ICD-10-CM | POA: Diagnosis not present

## 2014-04-28 ENCOUNTER — Telehealth: Payer: Self-pay | Admitting: Family Medicine

## 2014-04-28 ENCOUNTER — Telehealth: Payer: Self-pay | Admitting: Internal Medicine

## 2014-04-28 NOTE — Telephone Encounter (Signed)
Got scheduled with Jenny Reichmann

## 2014-04-28 NOTE — Telephone Encounter (Signed)
OK with me.

## 2014-04-28 NOTE — Telephone Encounter (Signed)
Is requesting to transfer from [place] to [place].  Please advise.

## 2014-04-28 NOTE — Telephone Encounter (Signed)
error 

## 2014-04-28 NOTE — Telephone Encounter (Signed)
Ok with me, but only if ok with Dr Elease Hashimoto

## 2014-05-15 DIAGNOSIS — H4011X1 Primary open-angle glaucoma, mild stage: Secondary | ICD-10-CM | POA: Diagnosis not present

## 2014-05-15 DIAGNOSIS — H2513 Age-related nuclear cataract, bilateral: Secondary | ICD-10-CM | POA: Diagnosis not present

## 2014-06-02 DIAGNOSIS — M25562 Pain in left knee: Secondary | ICD-10-CM | POA: Diagnosis not present

## 2014-06-02 DIAGNOSIS — M25561 Pain in right knee: Secondary | ICD-10-CM | POA: Diagnosis not present

## 2014-06-11 ENCOUNTER — Emergency Department (HOSPITAL_COMMUNITY)
Admission: EM | Admit: 2014-06-11 | Discharge: 2014-06-11 | Disposition: A | Discharge status: Home / Self Care | Payer: Medicare Other | Attending: Emergency Medicine | Admitting: Emergency Medicine

## 2014-06-11 ENCOUNTER — Emergency Department (HOSPITAL_COMMUNITY): Payer: Medicare Other

## 2014-06-11 ENCOUNTER — Encounter (HOSPITAL_COMMUNITY): Payer: Self-pay

## 2014-06-11 DIAGNOSIS — Z880 Allergy status to penicillin: Secondary | ICD-10-CM | POA: Diagnosis not present

## 2014-06-11 DIAGNOSIS — H409 Unspecified glaucoma: Secondary | ICD-10-CM | POA: Diagnosis not present

## 2014-06-11 DIAGNOSIS — M47816 Spondylosis without myelopathy or radiculopathy, lumbar region: Secondary | ICD-10-CM | POA: Diagnosis not present

## 2014-06-11 DIAGNOSIS — E780 Pure hypercholesterolemia: Secondary | ICD-10-CM | POA: Insufficient documentation

## 2014-06-11 DIAGNOSIS — Z872 Personal history of diseases of the skin and subcutaneous tissue: Secondary | ICD-10-CM | POA: Insufficient documentation

## 2014-06-11 DIAGNOSIS — M5416 Radiculopathy, lumbar region: Secondary | ICD-10-CM | POA: Diagnosis not present

## 2014-06-11 DIAGNOSIS — Z9889 Other specified postprocedural states: Secondary | ICD-10-CM | POA: Insufficient documentation

## 2014-06-11 DIAGNOSIS — Z7982 Long term (current) use of aspirin: Secondary | ICD-10-CM | POA: Diagnosis not present

## 2014-06-11 DIAGNOSIS — Z8701 Personal history of pneumonia (recurrent): Secondary | ICD-10-CM | POA: Insufficient documentation

## 2014-06-11 DIAGNOSIS — K219 Gastro-esophageal reflux disease without esophagitis: Secondary | ICD-10-CM | POA: Diagnosis not present

## 2014-06-11 DIAGNOSIS — M79606 Pain in leg, unspecified: Secondary | ICD-10-CM | POA: Diagnosis present

## 2014-06-11 DIAGNOSIS — Z79899 Other long term (current) drug therapy: Secondary | ICD-10-CM | POA: Diagnosis not present

## 2014-06-11 DIAGNOSIS — M791 Myalgia: Secondary | ICD-10-CM | POA: Diagnosis not present

## 2014-06-11 LAB — I-STAT CHEM 8, ED
BUN: 14 mg/dL (ref 6–23)
Calcium, Ion: 1.22 mmol/L (ref 1.13–1.30)
Chloride: 101 mmol/L (ref 96–112)
Creatinine, Ser: 0.9 mg/dL (ref 0.50–1.10)
Glucose, Bld: 104 mg/dL — ABNORMAL HIGH (ref 70–99)
HEMATOCRIT: 37 % (ref 36.0–46.0)
Hemoglobin: 12.6 g/dL (ref 12.0–15.0)
POTASSIUM: 4.1 mmol/L (ref 3.5–5.1)
Sodium: 137 mmol/L (ref 135–145)
TCO2: 22 mmol/L (ref 0–100)

## 2014-06-11 MED ORDER — IBUPROFEN 400 MG PO TABS: 400.0000 mg | ORAL_TABLET | Freq: Four times a day (QID) | ORAL | Status: DC | PRN

## 2014-06-11 MED ORDER — METHOCARBAMOL 500 MG PO TABS: 500.0000 mg | ORAL_TABLET | Freq: Three times a day (TID) | ORAL | Status: DC | PRN

## 2014-06-11 MED ORDER — TRAMADOL HCL 50 MG PO TABS: 50.0000 mg | ORAL_TABLET | Freq: Four times a day (QID) | ORAL | Status: DC | PRN

## 2014-06-11 NOTE — ED Notes (Signed)
Pt transported to xray 

## 2014-06-11 NOTE — Discharge Instructions (Signed)

## 2014-06-11 NOTE — ED Provider Notes (Signed)
CSN: [id number]     Arrival date & time 06/11/14  0411 History   First MD Initiated Contact with Patient 06/11/14 [phone]     Chief Complaint  Patient presents with  . Leg Pain     (Consider location/radiation/quality/duration/timing/severity/associated sxs/prior Treatment) HPI Patient states that she was lifting several potted plants on Friday. Saturday morning she began having low back pain radiating to the right buttock. She denies any urinary incontinence. No fever or chills. No direct trauma. No weakness or numbness. Patient also had some mild lower extremity cramping. This is resolved.  Past Medical History  Diagnosis Date  . Alopecia     stress related and resolved since her Mother moved to SNF  . Glaucoma     uses eye drops daily  . Hypercholesteremia     takes Simvastatin daily  . GERD (gastroesophageal reflux disease)     takes Nexium daily  . Pneumonia 1960  . H/O hiatal hernia    Past Surgical History  Procedure Laterality Date  . Abdominal hysterectomy      fibroids.  . Colonoscopy    . Knee arthroscopy with medial menisectomy Left 09/12/2013    Procedure: KNEE ARTHROSCOPY WITH DEBRIDEMENT;  Surgeon: Meredith Pel, MD;  Location: [facility];  Service: Orthopedics;  Laterality: Left;   Family History  Problem Relation Age of Onset  . COPD Neg Hx   . Diabetes Neg Hx   . Heart disease Neg Hx   . Stroke Neg Hx    History  Substance Use Topics  . Smoking status: Never Smoker   . Smokeless tobacco: Never Used  . Alcohol Use: No   OB History    No data available     Review of Systems  Constitutional: Negative for fever and chills.  Gastrointestinal: Negative for nausea, vomiting and abdominal pain.  Genitourinary: Negative for dysuria and difficulty urinating.  Musculoskeletal: Positive for myalgias and back pain. Negative for neck pain and neck stiffness.  Skin: Negative for rash and wound.  Neurological: Negative for dizziness, weakness, light-headedness,  numbness and headaches.  All other systems reviewed and are negative.     Allergies  Atorvastatin and Penicillins  Home Medications   Prior to Admission medications   Medication Sig Start Date End Date Taking? Authorizing Provider  aspirin 325 MG tablet Take 325 mg by mouth once.   Yes Historical Provider, MD  bimatoprost (LUMIGAN) 0.01 % SOLN Place 1 drop into both eyes at bedtime.   Yes Historical Provider, MD  brimonidine-timolol (COMBIGAN) 0.2-0.5 % ophthalmic solution Place 1 drop into both eyes 2 (two) times daily.   Yes Historical Provider, MD  brinzolamide (AZOPT) 1 % ophthalmic suspension Place 1 drop into both eyes 2 (two) times daily.   Yes Historical Provider, MD  Coenzyme Q10 (COQ10) 100 MG CAPS Take 100 mg by mouth daily.    Yes Historical Provider, MD  esomeprazole (NEXIUM) 40 MG capsule Take 1 capsule (40 mg total) by mouth daily at 12 noon. 03/13/14  Yes Eulas Post, MD  Garlic 235 MG TABS Take 100 mg by mouth daily.   Yes Historical Provider, MD  Magnesium 500 MG TABS Take 1 tablet by mouth daily.   Yes Historical Provider, MD  vitamin C (ASCORBIC ACID) 500 MG tablet Take 1,000 mg by mouth daily.    Yes Historical Provider, MD  vitamin E 400 UNIT capsule Take 400 Units by mouth daily.     Yes Historical Provider, MD  clorazepate (TRANXENE)  7.5 MG tablet Take 1 tablet (7.5 mg total) by mouth 3 (three) times daily. Patient not taking: Reported on 06/11/2014 11/17/13   Eulas Post, MD  ibuprofen (ADVIL,MOTRIN) 400 MG tablet Take 1 tablet (400 mg total) by mouth every 6 (six) hours as needed. 06/11/14   Julianne Rice, MD  methocarbamol (ROBAXIN) 500 MG tablet Take 1 tablet (500 mg total) by mouth every 8 (eight) hours as needed. 06/11/14   Julianne Rice, MD  simvastatin (ZOCOR) 20 MG tablet Take 1 tablet (20 mg total) by mouth daily. Patient not taking: Reported on 06/11/2014 03/31/14   Eulas Post, MD  traMADol (ULTRAM) 50 MG tablet Take 1 tablet (50 mg  total) by mouth every 6 (six) hours as needed. 06/11/14   Julianne Rice, MD   BP 113/61 mmHg  Pulse 67  Temp(Src) 97.6 F (36.4 C)  Resp 20  Ht 5' (1.524 m)  Wt 148 lb (67.132 kg)  BMI 28.90 kg/m2  SpO2 100% Physical Exam  Constitutional: She is oriented to person, place, and time. She appears well-developed and well-nourished. No distress.  HENT:  Head: Normocephalic and atraumatic.  Mouth/Throat: Oropharynx is clear and moist.  Eyes: EOM are normal. Pupils are equal, round, and reactive to light.  Neck: Normal range of motion. Neck supple.  Cardiovascular: Normal rate and regular rhythm.   Pulmonary/Chest: Effort normal and breath sounds normal. No respiratory distress. She has no wheezes. She has no rales. She exhibits no tenderness.  Abdominal: Soft. Bowel sounds are normal. She exhibits no distension and no mass. There is no tenderness. There is no rebound and no guarding.  Musculoskeletal: Normal range of motion. She exhibits no edema or tenderness.  No midline thoracic or lumbar tenderness to palpation. Negative straight leg raise. 2+ dorsalis pedis pulses bilaterally. No calf swelling or tenderness.  Neurological: She is alert and oriented to person, place, and time.  5/5 motor in all extremities. Sensation is fully intact.  Skin: Skin is warm and dry. No rash noted. No erythema.  Psychiatric: She has a normal mood and affect. Her behavior is normal.  Nursing note and vitals reviewed.   ED Course  Procedures (including critical care time) Labs Review Labs Reviewed  I-STAT CHEM 8, ED - Abnormal; Notable for the following:    Glucose, Bld 104 (*)    All other components within normal limits    Imaging Review Dg Lumbar Spine 2-3 Views  06/11/2014   CLINICAL DATA:  Right leg pain starting yesterday extending from the buttocks to the ankle. Tonight extends from the ankle up the leg. Cramping pain.  EXAM: LUMBAR SPINE - 2-3 VIEW  COMPARISON:  None.  FINDINGS: Normal  alignment of the lumbar spine. Degenerative changes throughout with narrowed lumbar interspaces and associated endplate hypertrophic changes. No vertebral compression deformities. No focal bone lesion or bone destruction. Vascular calcifications.  IMPRESSION: Degenerative changes. Normal line. No acute displaced fractures identified   Electronically Signed   By: Lucienne Capers M.D.   On: 06/11/2014 05:55     EKG Interpretation None      MDM   Final diagnoses:  Lumbar radiculopathy, acute    Symptoms concerning for lumbar radiculopathy. No urinary symptoms. No focal weakness or numbness. Pain is now currently resolved. Patient will be discharged home to follow-up with her primary physician. Return precautions have been given.    Julianne Rice, MD 06/11/14 [phone]

## 2014-06-11 NOTE — ED Notes (Signed)
Pt states that yesterday morning she started having pain in her right leg. On Friday pt was working with her potted plants and "felt something in my knee".

## 2014-06-12 ENCOUNTER — Encounter: Payer: Self-pay | Admitting: Internal Medicine

## 2014-06-12 ENCOUNTER — Ambulatory Visit (INDEPENDENT_AMBULATORY_CARE_PROVIDER_SITE_OTHER): Payer: Medicare Other | Admitting: Internal Medicine

## 2014-06-12 VITALS — BP 130/80 | HR 64 | Temp 98.5°F | Ht 60.0 in | Wt 153.0 lb

## 2014-06-12 DIAGNOSIS — M5416 Radiculopathy, lumbar region: Secondary | ICD-10-CM | POA: Diagnosis not present

## 2014-06-12 NOTE — Progress Notes (Signed)
Pre visit review using our clinic review tool, if applicable. No additional management support is needed unless otherwise documented below in the visit note. 

## 2014-06-12 NOTE — Patient Instructions (Signed)
The best exercises for the low back include freestyle swimming, stretch aerobics, and yoga.Cybex & Nautilus machines with weights < 25# rather than dead weights are better for the back.

## 2014-06-12 NOTE — Progress Notes (Signed)
   Subjective:    Patient ID: Hannah Madden, female    DOB: 01-21-1935, 80 y.o.   MRN: [id number]  HPI She was seen in emergency room 06/11/14 for right lumbar radiculopathy symptoms. This was in the context of having moved  heavy pots with plants inside 06/09/14 inside because of the anticipated freeze.   On 4/9 she was in bed approximately 10:30 AM when she experienced pain in the right inferior gluteal area which radiated down the lateral aspect of the leg as far as the lateral malleolus. She had a more severe episode Sunday 4/10 prompting the ER visit. The pain is sharp and cramping up to level X. She could not even stand on 4/10.  She was prescribed Robaxin, tramadol, and ibuprofen.  Films revealed degenerative changes without fracture or significant malalignment. There was some disc space narrowing. The films were reviewed with her.  Review of Systems There is no loss control of bladder or bowels.   She's had no fever, chills, sweats, weight loss.  There was no associated rash or change in the color or temperature of the skin in the area of her symptoms      Objective:   Physical Exam   Pertinent or positive findings include: She walks deliberately without limp.  She is able to walk on her heels and toes.  She is able to lie flat and sit up without help.  She has negative straight leg raising to 90.  She does have mild crepitus of the knees.  Deep tendon reflexes at the knees are 2+ and equal.  Strength and tone in the lower extremities are normal.  General appearance is one of good health and nourishment w/o distress. Eyes: No conjunctival inflammation or scleral icterus is present. Heart:  Normal rate and regular rhythm. S1 and S2 normal without gallop, murmur, click, rub or other extra sounds   Lungs:Chest clear to auscultation; no wheezes, rhonchi,rales ,or rubs present.No increased work of breathing.  Abdomen: bowel sounds normal, soft and non-tender without masses,  organomegaly or hernias noted.  No guarding or rebound . No tenderness over the flanks to percussion Skin:Warm & dry.  Intact without suspicious lesions or rashes ; no jaundice  Lymphatic: No lymphadenopathy is noted about the head, neck, axilla         Assessment & Plan:  #1 acute lumbar radiculopathy L2 & L5  Plan: She is to continue present medicines. Exercises for the back discussed. If symptoms persist or progress; physical therapy is recommended.

## 2014-06-14 ENCOUNTER — Telehealth: Payer: Self-pay | Admitting: Internal Medicine

## 2014-06-14 ENCOUNTER — Ambulatory Visit: Payer: Medicare Other | Admitting: Internal Medicine

## 2014-06-14 NOTE — Telephone Encounter (Signed)
Pt called in and wanted to see if Dr Jenny Reichmann nurse could call her .  She has some questions about her leg

## 2014-06-16 ENCOUNTER — Other Ambulatory Visit: Payer: Self-pay | Admitting: Internal Medicine

## 2014-06-16 DIAGNOSIS — M5416 Radiculopathy, lumbar region: Secondary | ICD-10-CM

## 2014-06-16 NOTE — Telephone Encounter (Signed)
Patient was seen by Dr. Linna Darner on Monday for right leg pain. She is confused on whether or not to stay off of the leg or continue doing light exercises. She wants to stay off of any pain medications and wanted to know if she could start physical therapy for the pain.

## 2014-06-16 NOTE — Telephone Encounter (Signed)
Ok to forward to Dr Linna Darner

## 2014-06-30 DIAGNOSIS — H43812 Vitreous degeneration, left eye: Secondary | ICD-10-CM | POA: Diagnosis not present

## 2014-06-30 DIAGNOSIS — H34811 Central retinal vein occlusion, right eye: Secondary | ICD-10-CM | POA: Diagnosis not present

## 2014-06-30 DIAGNOSIS — H3581 Retinal edema: Secondary | ICD-10-CM | POA: Diagnosis not present

## 2014-07-05 DIAGNOSIS — M25562 Pain in left knee: Secondary | ICD-10-CM | POA: Diagnosis not present

## 2014-07-13 NOTE — Telephone Encounter (Signed)
[facility]  Patient Name: Telma Oltmann  DOB: 1934/04/20    Initial Comment Caller states I have a insect bite it is red and itchy    Nurse Assessment  Nurse: Justine Null, RN, Rodena Piety Date/Time (Eastern Time): 07/13/2014 3:05:28 PM  Confirm and document reason for call. If symptomatic, describe symptoms. ---Caller states I have a insect bite it is red and itchy that in the center of the left arm and at the bend and has been taking the Benadryl for the itchiness and has not sure of what bite her and has been bitten on Tuesday and has had no fever and has redness and has no fevers and has no drainage and the redness is about the center and has been having a raised bump and not 2 inches in size  Has the patient traveled out of the country within the last 30 days? ---No  Does the patient require triage? ---Yes  Related visit to physician within the last 2 weeks? ---No  Does the PT have any chronic conditions? (i.e. diabetes, asthma, etc.) ---No     Guidelines    Guideline Title Affirmed Question Affirmed Notes  Insect Bite Itchy insect bite (all triage questions negative)    Final Disposition User   [place], RN, Rodena Piety

## 2014-07-19 ENCOUNTER — Telehealth: Payer: Self-pay | Admitting: *Deleted

## 2014-07-19 NOTE — Telephone Encounter (Signed)
PLEASE NOTE: All timestamps contained within this report are represented as [place] Standard Time. CONFIDENTIALTY NOTICE: This fax transmission is intended only for the addressee. It contains information that is legally privileged, confidential or otherwise protected from use or disclosure. If you are not the intended recipient, you are strictly prohibited from reviewing, disclosing, copying using or disseminating any of this information or taking any action in reliance on or regarding this information. If you have received this fax in error, please notify us immediately by telephone so that we can arrange for its return to [place]. Phone: [phone], Toll-Free: [phone], Fax: [phone] Page: 1 of 2 Call Id: [id number] [facility] - [facility] Patient Name: [facility] De[place] Gender: Female DOB: [birth year] Age: 80 Y 15 M 18 D Return Phone Number: [phone] (Primary) Address: City/State/Zip: [place] [address] Client [facility] - Client Client Site [facility] - Day Physician Jones, Lang[place] Type Call Call Type Triage / Clinical Relationship To Patient Self Appointment Disposition EMR Appointment Not Necessary Info pasted into Epic Yes Return Phone Number [phone] (Primary) Chief Complaint Insect Bite Initial Comment Caller states I have a insect bite it is red and itchy PreDisposition Call Doctor Nurse Assessment Nurse: Justine Null, RN, Rodena Piety Date/Time (Eastern Time): 07/13/2014 3:05:28 PM Confirm and document reason for call. If symptomatic, describe symptoms. ---Caller states I have a insect bite it is red and itchy that in the center of the left arm and at the bend and has been taking the Benadryl for the itchiness and has not sure of what bite her and has been bitten on Tuesday and has had no fever and has redness and has no fevers and has no drainage and the redness is  about the center and has been having a raised bump and not 2 inches in size Has the patient traveled out of the country within the last 30 days? ---No Does the patient require triage? ---Yes Related visit to physician within the last 2 weeks? ---No Does the PT have any chronic conditions? (i.e. diabetes, asthma, etc.) ---No Guidelines Guideline Title Affirmed Question Affirmed Notes Nurse Date/Time Eilene Ghazi Time) Insect Bite Itchy insect bite (all triage questions negative) Justine Null, RN, Rodena Piety 07/13/2014 3:08:46 PM Disp. Time Eilene Ghazi Time) Disposition Final User 07/13/2014 3:16:41 PM Send To RN Personal Justine Null, RN, Rodena Piety 07/13/2014 3:31:33 PM Call Completed Justine Null, RN, Rodena Piety 07/13/2014 3:13:38 PM Home Care Yes Justine Null, RN, Inis Sizer Understands: Yes PLEASE NOTE: All timestamps contained within this report are represented as [place] Standard Time. CONFIDENTIALTY NOTICE: This fax transmission is intended only for the addressee. It contains information that is legally privileged, confidential or otherwise protected from use or disclosure. If you are not the intended recipient, you are strictly prohibited from reviewing, disclosing, copying using or disseminating any of this information or taking any action in reliance on or regarding this information. If you have received this fax in error, please notify us immediately by telephone so that we can arrange for its return to [place]. Phone: [phone], Toll-Free: [phone], Fax: [phone] Page: 2 of 2 Call Id: [id number] Disagree/Comply: Comply Care Advice Given Per Guideline HOME CARE: You should be able to treat this at home. REASSURANCE: * It sounds like a normal reaction to an insect bite. * While most insect bites are a small red bump, some are larger (like a hive) and some have a small water blister in the center. * This does not mean  you have an allergy or that the bite is infected. * Rub a bite with an ice cube for 30 seconds. FOUR SIMPLE  HOME REMEDIES FOR ITCHY [place]QU[place]O BITES: * Try applying firm, direct, steady pressure to a bite for 10 seconds. A fingernail, pen cap, or other object can be used. * Apply calamine lotion to the bites. * Make baking soda paste by mixing baking soda with a small amount of water. Apply the paste to the bites. * Try not to scratch. * Itching is often worsened by scratching (the 'Itch-Scratch' cycle). * Cut the fingernails short. (Reason: prevent secondary bacterial infection.) HYDROCORTISONE CREAM FOR ITCHING: * If the itch is severe, use hydrocortisone cream. Apply 4 times a day until the itch is less severe, then switch to calamine lotion. * Available OTC in [place] as 0.5% and 1% cream. EXPECTED COURSE: * Most insect bites are itchy and puffy for several days. * Insect bites of the upper face can cause marked swelling around the eye, but this is harmless. * Any pinkness or redness usually lasts 3 days. CALL BACK IF: * Severe pain persists over 2 hours after pain medicine * Bite looks infected (pus, red streaks, increased tenderness) * Bite has not healed after 14 days * Redness getting larger and more than 48 hours after the bite * You become worse. CARE ADVICE given per Insect Bite (Adult) guideline. After Care Instructions Given Call Event Type User Date / Time Description

## 2014-08-14 DIAGNOSIS — H43813 Vitreous degeneration, bilateral: Secondary | ICD-10-CM | POA: Diagnosis not present

## 2014-08-14 DIAGNOSIS — H2513 Age-related nuclear cataract, bilateral: Secondary | ICD-10-CM | POA: Diagnosis not present

## 2014-08-14 DIAGNOSIS — H34811 Central retinal vein occlusion, right eye: Secondary | ICD-10-CM | POA: Diagnosis not present

## 2014-08-14 DIAGNOSIS — H4011X2 Primary open-angle glaucoma, moderate stage: Secondary | ICD-10-CM | POA: Diagnosis not present

## 2014-08-17 ENCOUNTER — Ambulatory Visit: Payer: Medicare Other | Admitting: Internal Medicine

## 2014-08-21 ENCOUNTER — Other Ambulatory Visit: Payer: Self-pay | Admitting: Family Medicine

## 2014-08-22 ENCOUNTER — Encounter: Payer: Self-pay | Admitting: Internal Medicine

## 2014-08-22 ENCOUNTER — Ambulatory Visit (INDEPENDENT_AMBULATORY_CARE_PROVIDER_SITE_OTHER): Payer: Medicare Other | Admitting: Internal Medicine

## 2014-08-22 VITALS — BP 108/64 | HR 75 | Temp 98.3°F | Ht 65.0 in | Wt 151.0 lb

## 2014-08-22 DIAGNOSIS — M25562 Pain in left knee: Secondary | ICD-10-CM

## 2014-08-22 DIAGNOSIS — M25462 Effusion, left knee: Secondary | ICD-10-CM | POA: Diagnosis not present

## 2014-08-22 NOTE — Patient Instructions (Signed)
You will be contacted regarding the referral for: sports medicine  Please continue all other medications as before, and refills have been done if requested.  Please have the pharmacy call with any other refills you may need.  Please continue your efforts at being more active, low cholesterol diet, and weight control..  Please keep your appointments with your specialists as you may have planned

## 2014-08-22 NOTE — Progress Notes (Signed)
Subjective:    Patient ID: Hannah Madden, female    DOB: [birth year], 80 y.o.   MRN: [id number]  HPI here with left knee pain, is  S/p left knee surgury September 12, 2013 for torn meniscus, did well after.  In April 2016 was active with planting flowers with watering involved, with onset right sciatica type pain onset mild and transient and resolved, then later with new onset left knee pain and swelling in recent weeks, saw ortho at onset pain (no swelling) but neg exam, asked to return if worse.  Here today as left knee pain and swelling worse and cannot get appt with ortho until June 30.  Does try to stay active overall with situps, and leg lifts with light weights, and plans to start golf lessons soon.  Very concerned she will be limited with her activity.  No fever, trauma, giveways or falls Past Medical History  Diagnosis Date  . Alopecia     stress related and resolved since her Mother moved to SNF  . Glaucoma     uses eye drops daily  . Hypercholesteremia     takes Simvastatin daily  . GERD (gastroesophageal reflux disease)     takes Nexium daily  . Pneumonia 1960  . H/O hiatal hernia    Past Surgical History  Procedure Laterality Date  . Abdominal hysterectomy      fibroids.  . Colonoscopy    . Knee arthroscopy with medial menisectomy Left 09/12/2013    Procedure: KNEE ARTHROSCOPY WITH DEBRIDEMENT;  Surgeon: Meredith Pel, MD;  Location: [facility];  Service: Orthopedics;  Laterality: Left;    reports that she has never smoked. She has never used smokeless tobacco. She reports that she does not drink alcohol or use illicit drugs. family history is negative for COPD, Diabetes, Heart disease, and Stroke. Allergies  Allergen Reactions  . Atorvastatin     REACTION: INTOL to Lipitor w/ leg pain  . Penicillins     REACTION: hives   Current Outpatient Prescriptions on File Prior to Visit  Medication Sig Dispense Refill  . brimonidine-timolol (COMBIGAN) 0.2-0.5 % ophthalmic  solution Place 1 drop into both eyes 2 (two) times daily.    . brinzolamide (AZOPT) 1 % ophthalmic suspension Place 1 drop into both eyes 2 (two) times daily.    . clorazepate (TRANXENE) 7.5 MG tablet Take 1 tablet (7.5 mg total) by mouth 3 (three) times daily. 90 tablet 0  . Coenzyme Q10 (COQ10) 100 MG CAPS Take 100 mg by mouth daily.     Marland Kitchen esomeprazole (NEXIUM) 40 MG capsule Take 1 capsule (40 mg total) by mouth daily at 12 noon. 90 capsule 1  . Garlic 397 MG TABS Take 100 mg by mouth daily.    . Magnesium 500 MG TABS Take 1 tablet by mouth daily.    . vitamin C (ASCORBIC ACID) 500 MG tablet Take 1,000 mg by mouth daily.     . vitamin E 400 UNIT capsule Take 400 Units by mouth daily.      . bimatoprost (LUMIGAN) 0.01 % SOLN Place 1 drop into both eyes at bedtime.    Marland Kitchen ibuprofen (ADVIL,MOTRIN) 400 MG tablet Take 1 tablet (400 mg total) by mouth every 6 (six) hours as needed. (Patient not taking: Reported on 08/22/2014) 30 tablet 0  . methocarbamol (ROBAXIN) 500 MG tablet Take 1 tablet (500 mg total) by mouth every 8 (eight) hours as needed. (Patient not taking: Reported on 08/22/2014) 30 tablet 0  .  traMADol (ULTRAM) 50 MG tablet Take 1 tablet (50 mg total) by mouth every 6 (six) hours as needed. (Patient not taking: Reported on 08/22/2014) 15 tablet 0   No current facility-administered medications on file prior to visit.    Review of Systems  All otherwise neg per pt     Objective:   Physical Exam BP 108/64 mmHg  Pulse 75  Temp(Src) 98.3 F (36.8 C) (Oral)  Ht 5\' 5"  (1.651 m)  Wt 151 lb (68.493 kg)  BMI 25.13 kg/m2  SpO2 99% VS noted,  Constitutional: Pt appears in no significant distress HENT: Head: NCAT.  Right Ear: External ear normal.  Left Ear: External ear normal.  Eyes: . Pupils are equal, round, and reactive to light. Conjunctivae and EOM are normal Neck: Normal range of motion. Neck supple.  Cardiovascular: Normal rate and regular rhythm.   Pulmonary/Chest: Effort  normal and breath sounds without rales or wheezing.  Left knee with 2+ effusion but minimal warmth, no erythema, mild tender at worse to palpate - pain seems to be "on the inside" Neurological: Pt is alert. Not confused , motor grossly intact Skin: Skin is warm. No rash, no LE edema Psychiatric: Pt behavior is normal. No agitation.     Assessment & Plan:

## 2014-08-22 NOTE — Assessment & Plan Note (Signed)
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

## 2014-08-22 NOTE — Progress Notes (Signed)
Pre visit review using our clinic review tool, if applicable. No additional management support is needed unless otherwise documented below in the visit note. 

## 2014-08-24 ENCOUNTER — Other Ambulatory Visit: Payer: Self-pay | Admitting: Internal Medicine

## 2014-08-24 ENCOUNTER — Ambulatory Visit (INDEPENDENT_AMBULATORY_CARE_PROVIDER_SITE_OTHER): Payer: Medicare Other | Admitting: Family Medicine

## 2014-08-24 ENCOUNTER — Encounter: Payer: Self-pay | Admitting: Family Medicine

## 2014-08-24 ENCOUNTER — Ambulatory Visit (INDEPENDENT_AMBULATORY_CARE_PROVIDER_SITE_OTHER)
Admission: RE | Admit: 2014-08-24 | Discharge: 2014-08-24 | Disposition: A | Discharge status: Home / Self Care | Payer: Medicare Other | Source: Ambulatory Visit | Attending: Family Medicine | Admitting: Family Medicine

## 2014-08-24 ENCOUNTER — Other Ambulatory Visit (INDEPENDENT_AMBULATORY_CARE_PROVIDER_SITE_OTHER): Payer: Medicare Other

## 2014-08-24 VITALS — BP 120/70 | HR 68 | Ht 65.0 in | Wt 152.0 lb

## 2014-08-24 DIAGNOSIS — M25562 Pain in left knee: Secondary | ICD-10-CM

## 2014-08-24 DIAGNOSIS — D259 Leiomyoma of uterus, unspecified: Secondary | ICD-10-CM | POA: Diagnosis not present

## 2014-08-24 DIAGNOSIS — M1712 Unilateral primary osteoarthritis, left knee: Secondary | ICD-10-CM | POA: Diagnosis not present

## 2014-08-24 DIAGNOSIS — IMO0002 Reserved for concepts with insufficient information to code with codable children: Secondary | ICD-10-CM

## 2014-08-24 DIAGNOSIS — M129 Arthropathy, unspecified: Secondary | ICD-10-CM

## 2014-08-24 DIAGNOSIS — Z01419 Encounter for gynecological examination (general) (routine) without abnormal findings: Secondary | ICD-10-CM | POA: Diagnosis not present

## 2014-08-24 DIAGNOSIS — M25462 Effusion, left knee: Secondary | ICD-10-CM | POA: Diagnosis not present

## 2014-08-24 MED ORDER — PANTOPRAZOLE SODIUM 40 MG PO TBEC: DELAYED_RELEASE_TABLET | ORAL | Status: DC

## 2014-08-24 NOTE — Telephone Encounter (Signed)
Patient states she seen Dr. Jenny Reichmann yesterday and forgot to ask him to change her nexium.  Patient states Dr. Lenna Gilford tried her on something before that she liked but she can not remember what the name of the med is.  She is hoping that it can be found in her records.  [facility] [address]  If patient likes new med then she would [facility]

## 2014-08-24 NOTE — Progress Notes (Signed)
Pre visit review using our clinic review tool, if applicable. No additional management support is needed unless otherwise documented below in the visit note. 

## 2014-08-24 NOTE — Telephone Encounter (Signed)
Med was Protonix. Pended for PCP review of appropriateness.

## 2014-08-24 NOTE — Patient Instructions (Addendum)
Good to see you.  Ice 20 minutes 2 times daily. Usually after activity and before bed. Exercises 3 times a week.  xrays downstairs Take tylenol 500 mg three times a day is the best evidence based medicine we have for arthritis.  Glucosamine sulfate 1500mg  a day is a supplement that has been shown to help moderate to severe arthritis. Vitamin D 2000 IU daily Fish oil 2 grams daily.  Tumeric 500mg  twice daily.  Capsaicin topically up to four times a day may also help with pain. If cortisone injections do not help, there are different types of shots that may help but they take longer to take effect.  We can discuss this at follow up.  It's important that you continue to stay active. Controlling your weight is important.  Consider physical therapy to strengthen muscles around the joint that hurts to take pressure off of the joint itself. Shoe inserts with good arch support may be helpful.  [employer] orth[facility] at [employer] [facility] could help.  Water aerobics and cycling with low resistance are the best two types of exercise for arthritis. Come back and see me in 4 weeks.

## 2014-08-24 NOTE — Progress Notes (Signed)
[facility] [place], [place] [address] Phone: [phone] Subjective:    I'm seeing this patient by the request  of:  Cathlean Cower, MD   CC: Knee pain left  [id number] MILINA PAGETT is a 80 y.o. female coming in with complaint of left knee pain. Patient has a past medical history for significant knee pain back in 2015. Patient had an MRI at that time. This was reviewed by me. 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 Patient states her knee has been doing very well after surgery when she had this in February 2015. Patient states over the course last several week she started having increasing pain and seems to have some swelling. Has lost some range of motion. Denies any instability. Patient states though if she tries to do a lot of activity she has a soreness in the knee. Denies any radiation of pain or any numbness. Patient rates the severity of pain a 5 out of 10. Denies any nighttime awakening. Still able to do activities of daily living.  Past Medical History  Diagnosis Date  . Alopecia     stress related and resolved since her Mother moved to SNF  . Glaucoma     uses eye drops daily  . Hypercholesteremia     takes Simvastatin daily  . GERD (gastroesophageal reflux disease)     takes Nexium daily  . Pneumonia 1960  . H/O hiatal hernia    Past Surgical History  Procedure Laterality Date  . Abdominal hysterectomy      fibroids.  . Colonoscopy    . Knee arthroscopy with medial menisectomy Left 09/12/2013    Procedure: KNEE ARTHROSCOPY WITH DEBRIDEMENT;  Surgeon: Meredith Pel, MD;  Location: [place];  Service: Orthopedics;  Laterality: Left;   History  Substance Use Topics  . Smoking status: Never Smoker   . Smokeless tobacco: Never Used  . Alcohol Use: No   Allergies  Allergen Reactions  . Atorvastatin     REACTION:  INTOL to Lipitor w/ leg pain  . Penicillins     REACTION: hives   Family History  Problem Relation Age of Onset  . COPD Neg Hx   . Diabetes Neg Hx   . Heart disease Neg Hx   . Stroke Neg Hx        Past medical history, social, surgical and family history all reviewed in electronic medical record.   Review of Systems: No headache, visual changes, nausea, vomiting, diarrhea, constipation, dizziness, abdominal pain, skin rash, fevers, chills, night sweats, weight loss, swollen lymph nodes, body aches, joint swelling, muscle aches, chest pain, shortness of breath, mood changes.   Objective Blood pressure 120/70, pulse 68, height 5\' 5"  (1.651 m), weight 152 lb (68.947 kg).  General: No apparent distress alert and oriented x3 mood and affect normal, dressed appropriately.  HEENT: Pupils equal, extraocular movements intact  Respiratory: Patient's speak in full sentences and does not appear short of breath  Cardiovascular: No lower extremity edema, non tender, no erythema  Skin: Warm dry intact with no signs of infection or rash on extremities or on axial skeleton.  Abdomen: Soft nontender  Neuro: Cranial nerves II through XII are intact, neurovascularly intact in all extremities with 2+ DTRs and 2+ pulses.  Lymph: No lymphadenopathy of posterior or anterior cervical chain or axillae bilaterally.  Gait normal with good  balance and coordination.  MSK:  Non tender with full range of motion and good stability and symmetric strength and tone of shoulders, elbows, wrist, hip, and ankles bilaterally.  Knee: Left knee Effusion noted Mild tenderness to palpation over the medial and lateral joint line and the superior patellar joint. ROM full in flexion and extension and lower leg rotation. Ligaments with solid consistent endpoints including ACL, PCL, LCL, MCL. Minorly positive Mcmurray's, Apley's, and Thessalonian tests. painful patellar compression. Patellar glide with mild crepitus. Patellar  and quadriceps tendons unremarkable. Hamstring and quadriceps strength is normal.   MSK US performed of: Left knee This study was ordered, performed, and interpreted by Charlann Boxer D.O.  Knee: All structures visualized. Significant effusion of the prepatellar pouch Moderate narrowing of the medial and lateral joint lines Patellar Tendon unremarkable on long and transverse views without effusion. No abnormality of prepatellar bursa. LCL and MCL unremarkable on long and transverse views. No abnormality of origin of medial or lateral head of the gastrocnemius.  IMPRESSION:  Left knee arthritis with effusion  Procedure: Real-time Ultrasound Guided Injection of left knee Device: GE Logiq E  Ultrasound guided injection is preferred based studies that show increased duration, increased effect, greater accuracy, decreased procedural pain, increased response rate, and decreased cost with ultrasound guided versus blind injection.  Verbal informed consent obtained.  Time-out conducted.  Noted no overlying erythema, induration, or other signs of local infection.  Skin prepped in a sterile fashion.  Local anesthesia: Topical Ethyl chloride.  With sterile technique and under real time ultrasound guidance: With a 22-gauge 2 inch needle patient was injected with 4 cc of 0.5% Marcaine and removed 30 mL of strawlike or fluid then injected 1 cc of Kenalog 40 mg/dL. This was from a superior lateral approach.  Completed without difficulty  Pain immediately resolved suggesting accurate placement of the medication.  Advised to call if fevers/chills, erythema, induration, drainage, or persistent bleeding.  Images permanently stored and available for review in the ultrasound unit.  Impression: Technically successful ultrasound guided injection.  Procedure note 59563; 15 minutes spent for Therapeutic exercises as stated in above notes.  This included exercises focusing on stretching, strengthening, with  significant focus on eccentric aspects. Patient was given exercises for flexion-extension as well as vastus medialis oblique strengthening and hip abductor strengthening. Discussed stretching of the hamstring quadricep as well.   Proper technique shown and discussed handout in great detail with ATC.  All questions were discussed and answered.     Impression and Recommendations:     This case required medical decision making of moderate complexity.

## 2014-08-24 NOTE — Assessment & Plan Note (Signed)
I believe the patient does have degenerative joint disease by moderate to severe in nature. Patient does have some loose bodies noted as well. Patient was given an injection today after aspiration of 30 mL of strawlike all fluid from the knee itself. We discussed home exercises, icing protocol, as well as patient was given a unloader brace. Patient is chronic try to wear this with some activities. Discussed with her the only limitation would be no jumping or running. Patient will come back and see me again in 3-4 weeks. Patient could be a candidate for viscous supplementation. X-rays ordered to reevaluate for the amount of osteophytic changes.

## 2014-08-25 ENCOUNTER — Telehealth: Payer: Self-pay | Admitting: Family Medicine

## 2014-08-25 ENCOUNTER — Telehealth: Payer: Self-pay | Admitting: Internal Medicine

## 2014-08-25 NOTE — Telephone Encounter (Signed)
Patient called in regarding her xray results. She doesn't use MyChart so she was hoping to come in to pick them up. Can you call her on Monday with her results

## 2014-08-28 NOTE — Telephone Encounter (Signed)
Detailed msg left on pt's vmail.

## 2014-09-20 ENCOUNTER — Ambulatory Visit: Payer: Medicare Other | Admitting: Family Medicine

## 2014-10-03 ENCOUNTER — Other Ambulatory Visit: Payer: Self-pay | Admitting: *Deleted

## 2014-10-03 ENCOUNTER — Encounter: Payer: Medicare Other | Admitting: Internal Medicine

## 2014-10-03 ENCOUNTER — Telehealth: Payer: Self-pay | Admitting: *Deleted

## 2014-10-03 DIAGNOSIS — H3582 Retinal ischemia: Secondary | ICD-10-CM | POA: Diagnosis not present

## 2014-10-03 DIAGNOSIS — H34811 Central retinal vein occlusion, right eye: Secondary | ICD-10-CM | POA: Diagnosis not present

## 2014-10-03 DIAGNOSIS — H43812 Vitreous degeneration, left eye: Secondary | ICD-10-CM | POA: Diagnosis not present

## 2014-10-03 DIAGNOSIS — H3581 Retinal edema: Secondary | ICD-10-CM | POA: Diagnosis not present

## 2014-10-03 MED ORDER — METOCLOPRAMIDE HCL 10 MG PO TABS: 10.0000 mg | ORAL_TABLET | Freq: Four times a day (QID) | ORAL | Status: DC | PRN

## 2014-10-03 NOTE — Telephone Encounter (Signed)
Left msg on triage stating been having some diarrhea and stomach cramps on yesterday. Been feeling nauseas since this am. Wanting to get updated [facility] Notified pt rx has been sent...Johny Chess

## 2014-10-06 ENCOUNTER — Encounter: Payer: Self-pay | Admitting: Gastroenterology

## 2014-10-09 ENCOUNTER — Telehealth: Payer: Self-pay | Admitting: Internal Medicine

## 2014-10-09 DIAGNOSIS — K219 Gastro-esophageal reflux disease without esophagitis: Secondary | ICD-10-CM

## 2014-10-09 NOTE — Telephone Encounter (Signed)
Pt called has some question about her acid reflux?  She would like a call back from the nurse .  Pt needs a new med for her acid reflux and gas that she is having    Best number [phone]

## 2014-10-10 MED ORDER — RANITIDINE HCL 150 MG PO TABS: 150.0000 mg | ORAL_TABLET | Freq: Two times a day (BID) | ORAL | Status: DC

## 2014-10-10 MED ORDER — PANTOPRAZOLE SODIUM 40 MG PO TBEC: DELAYED_RELEASE_TABLET | ORAL | Status: DC

## 2014-10-10 NOTE — Telephone Encounter (Signed)
Advised patient of dr Judi Cong note, patient repeated back for understanding

## 2014-10-10 NOTE — Telephone Encounter (Signed)
Unfort, dr Sharlett Iles has retired  We normally dont treat long term with reglan due risk of neurological side effects that can become permanent  I would stop the reglan for now, and try a different medication, such as zantac 150 bid   I will refer to GI as well

## 2014-10-10 NOTE — Telephone Encounter (Signed)
As long as there are no other symptoms ( abd pain, dysphagia, n/v, bowel change or blood, recent wt loss) I think we can advise increase protonix to 40 bid, but I would also refer to GI if ok with pt - please me know, as uncontrolled symptoms sometimes warrants an EGD

## 2014-10-10 NOTE — Telephone Encounter (Signed)
Left message advising patient to call back----patient needs to talk with Randen Kauth----

## 2014-10-10 NOTE — Telephone Encounter (Signed)
Patient stated protonix gave her diarrhea, she is currently taking reglan, and would like to continue with reglan---she has an appx 30 day supply and will call back later for refill if she needs it---she has office visit with you in sept and will let you know how reglan worked----patient would like to have referral for GI, and has seen dr Sharlett Iles with [facility] in the past---has requested to see Dr Sharlett Iles for GI again if possible

## 2014-10-12 ENCOUNTER — Other Ambulatory Visit: Payer: Self-pay

## 2014-10-12 DIAGNOSIS — Z1231 Encounter for screening mammogram for malignant neoplasm of breast: Secondary | ICD-10-CM

## 2014-10-18 DIAGNOSIS — M25562 Pain in left knee: Secondary | ICD-10-CM | POA: Diagnosis not present

## 2014-10-19 ENCOUNTER — Telehealth: Payer: Self-pay | Admitting: Gastroenterology

## 2014-10-19 NOTE — Telephone Encounter (Signed)
Patient c/o gas and bloating.  She is reluctant to go on a PPI.  She will try OTC Align for her excess gas to see if this helps  Patient will come in earlier to see Nicoletta Ba PA on 10/27/14 2:00

## 2014-10-27 ENCOUNTER — Ambulatory Visit: Payer: Medicare Other | Admitting: Physician Assistant

## 2014-10-31 ENCOUNTER — Telehealth: Payer: Self-pay

## 2014-10-31 NOTE — Telephone Encounter (Signed)
Patient called to educate on Medicare Wellness apt. LVM for the patient to call back to educate and schedule for wellness visit.   Apt with Dr. Jenny Reichmann scheduled for CPE 09/214 at 8:30

## 2014-11-01 ENCOUNTER — Telehealth: Payer: Self-pay

## 2014-11-01 NOTE — Telephone Encounter (Signed)
Spoke to the patient regarding AWV; STates she is at the gym now. Plays golf often and embraces exercise and self care. States she keeps up with her health issues and for now, doesn't feel the AWV is necessary for her at this time. Will keep apt with Dr. Jenny Reichmann.

## 2014-11-03 ENCOUNTER — Ambulatory Visit: Payer: Medicare Other | Admitting: Physician Assistant

## 2014-11-13 ENCOUNTER — Ambulatory Visit
Admission: RE | Admit: 2014-11-13 | Discharge: 2014-11-13 | Disposition: A | Discharge status: Home / Self Care | Payer: Medicare Other | Source: Ambulatory Visit

## 2014-11-13 DIAGNOSIS — Z1231 Encounter for screening mammogram for malignant neoplasm of breast: Secondary | ICD-10-CM | POA: Diagnosis not present

## 2014-11-15 ENCOUNTER — Encounter: Payer: Self-pay | Admitting: Internal Medicine

## 2014-11-15 ENCOUNTER — Other Ambulatory Visit (INDEPENDENT_AMBULATORY_CARE_PROVIDER_SITE_OTHER): Payer: Medicare Other

## 2014-11-15 ENCOUNTER — Ambulatory Visit (INDEPENDENT_AMBULATORY_CARE_PROVIDER_SITE_OTHER): Payer: Medicare Other | Admitting: Internal Medicine

## 2014-11-15 VITALS — BP 100/58 | HR 70 | Temp 97.6°F | Ht 65.0 in | Wt 147.0 lb

## 2014-11-15 DIAGNOSIS — E7800 Pure hypercholesterolemia, unspecified: Secondary | ICD-10-CM

## 2014-11-15 DIAGNOSIS — Z0000 Encounter for general adult medical examination without abnormal findings: Secondary | ICD-10-CM

## 2014-11-15 DIAGNOSIS — K219 Gastro-esophageal reflux disease without esophagitis: Secondary | ICD-10-CM | POA: Diagnosis not present

## 2014-11-15 DIAGNOSIS — F411 Generalized anxiety disorder: Secondary | ICD-10-CM

## 2014-11-15 LAB — CBC WITH DIFFERENTIAL/PLATELET
BASOS PCT: 1 % (ref 0.0–3.0)
Basophils Absolute: 0.1 10*3/uL (ref 0.0–0.1)
EOS PCT: 1.8 % (ref 0.0–5.0)
Eosinophils Absolute: 0.1 10*3/uL (ref 0.0–0.7)
HCT: 34.7 % — ABNORMAL LOW (ref 36.0–46.0)
Hemoglobin: 11.7 g/dL — ABNORMAL LOW (ref 12.0–15.0)
LYMPHS ABS: 2.8 10*3/uL (ref 0.7–4.0)
Lymphocytes Relative: 40 % (ref 12.0–46.0)
MCHC: 33.6 g/dL (ref 30.0–36.0)
MCV: 85.6 fl (ref 78.0–100.0)
MONO ABS: 0.6 10*3/uL (ref 0.1–1.0)
Monocytes Relative: 8.6 % (ref 3.0–12.0)
NEUTROS ABS: 3.4 10*3/uL (ref 1.4–7.7)
NEUTROS PCT: 48.6 % (ref 43.0–77.0)
PLATELETS: 230 10*3/uL (ref 150.0–400.0)
RBC: 4.05 Mil/uL (ref 3.87–5.11)
RDW: 14.1 % (ref 11.5–15.5)
WBC: 6.9 10*3/uL (ref 4.0–10.5)

## 2014-11-15 LAB — URINALYSIS, ROUTINE W REFLEX MICROSCOPIC
BILIRUBIN URINE: NEGATIVE
Hgb urine dipstick: NEGATIVE
KETONES UR: NEGATIVE
NITRITE: NEGATIVE
RBC / HPF: NONE SEEN (ref 0–?)
Specific Gravity, Urine: 1.01 (ref 1.000–1.030)
Total Protein, Urine: NEGATIVE
UROBILINOGEN UA: 0.2 (ref 0.0–1.0)
Urine Glucose: NEGATIVE
pH: 7 (ref 5.0–8.0)

## 2014-11-15 LAB — HEPATIC FUNCTION PANEL
ALBUMIN: 4 g/dL (ref 3.5–5.2)
ALK PHOS: 74 U/L (ref 39–117)
ALT: 9 U/L (ref 0–35)
AST: 15 U/L (ref 0–37)
BILIRUBIN DIRECT: 0.1 mg/dL (ref 0.0–0.3)
BILIRUBIN TOTAL: 0.4 mg/dL (ref 0.2–1.2)
Total Protein: 6.6 g/dL (ref 6.0–8.3)

## 2014-11-15 LAB — LIPID PANEL
CHOLESTEROL: 169 mg/dL (ref 0–200)
HDL: 53.7 mg/dL (ref >39.00)
LDL CALC: 97 mg/dL (ref 0–99)
NONHDL: 115.37
Total CHOL/HDL Ratio: 3
Triglycerides: 92 mg/dL (ref 0.0–149.0)
VLDL: 18.4 mg/dL (ref 0.0–40.0)

## 2014-11-15 LAB — BASIC METABOLIC PANEL
BUN: 13 mg/dL (ref 6–23)
CO2: 25 mEq/L (ref 19–32)
Calcium: 9.4 mg/dL (ref 8.4–10.5)
Chloride: 105 mEq/L (ref 96–112)
Creatinine, Ser: 0.86 mg/dL (ref 0.40–1.20)
GFR: 82.1 mL/min (ref >60.00)
GLUCOSE: 92 mg/dL (ref 70–99)
POTASSIUM: 4.7 meq/L (ref 3.5–5.1)
SODIUM: 139 meq/L (ref 135–145)

## 2014-11-15 LAB — TSH: TSH: 1.19 u[IU]/mL (ref 0.35–4.50)

## 2014-11-15 MED ORDER — BUSPIRONE HCL 7.5 MG PO TABS: 7.5000 mg | ORAL_TABLET | Freq: Three times a day (TID) | ORAL | Status: DC | PRN

## 2014-11-15 NOTE — Addendum Note (Signed)
Addended by: Biagio Borg on: 11/15/2014 12:45 PM   Modules accepted: Miquel Dunn

## 2014-11-15 NOTE — Assessment & Plan Note (Signed)
To cont PPI, f/u GI as planned,  to f/u any worsening symptoms or concerns

## 2014-11-15 NOTE — Progress Notes (Signed)
Subjective:    Patient ID: Hannah Madden, female    DOB: Dec 03, 1934, 80 y.o.   MRN: [id number]  HPI  Here for yearly f/u;  Overall doing ok;  Pt denies Chest pain, worsening SOB, DOE, wheezing, orthopnea, PND, worsening LE edema, palpitations, dizziness or syncope.  Pt denies neurological change such as new headache, facial or extremity weakness.  Pt denies polydipsia, polyuria, or low sugar symptoms. Pt states overall good compliance with treatment and medications, good tolerability, and has been trying to follow appropriate diet.  Pt denies worsening depressive symptoms, suicidal ideation or panic. No fever, night sweats, wt loss, loss of appetite, or other constitutional symptoms.  Pt states good ability with ADL's, has low fall risk, home safety reviewed and adequate, no other significant changes in hearing or vision, and only occasionally active with exercise. Declines flu shot and pneumonia shots for now.  Abd discomfort mentioned prior resolved with probiotic.  Re-started PPI protonix for persistent indigestion, has appt with GI swoon. Asks for change tranxene to avoid dependence.  Past Medical History  Diagnosis Date  . Alopecia     stress related and resolved since her Mother moved to SNF  . Glaucoma     uses eye drops daily  . Hypercholesteremia     takes Simvastatin daily  . GERD (gastroesophageal reflux disease)     takes Nexium daily  . Pneumonia 1960  . H/O hiatal hernia    Past Surgical History  Procedure Laterality Date  . Abdominal hysterectomy      fibroids.  . Colonoscopy    . Knee arthroscopy with medial menisectomy Left 09/12/2013    Procedure: KNEE ARTHROSCOPY WITH DEBRIDEMENT;  Surgeon: Meredith Pel, MD;  Location: [place];  Service: Orthopedics;  Laterality: Left;    reports that she has never smoked. She has never used smokeless tobacco. She reports that she does not drink alcohol or use illicit drugs. family history is negative for COPD, Diabetes, Heart  disease, and Stroke. Allergies  Allergen Reactions  . Atorvastatin     REACTION: INTOL to Lipitor w/ leg pain  . Penicillins     REACTION: hives  . Protonix [Pantoprazole Sodium] Other (See Comments)    diarrhea   Current Outpatient Prescriptions on File Prior to Visit  Medication Sig Dispense Refill  . bimatoprost (LUMIGAN) 0.01 % SOLN Place 1 drop into both eyes at bedtime.    . brimonidine-timolol (COMBIGAN) 0.2-0.5 % ophthalmic solution Place 1 drop into both eyes 2 (two) times daily.    . brinzolamide (AZOPT) 1 % ophthalmic suspension Place 1 drop into both eyes 2 (two) times daily.    . Coenzyme Q10 (COQ10) 100 MG CAPS Take 100 mg by mouth daily.     . Garlic 498 MG TABS Take 100 mg by mouth daily.    . Magnesium 500 MG TABS Take 1 tablet by mouth daily.    . vitamin C (ASCORBIC ACID) 500 MG tablet Take 1,000 mg by mouth daily.     . vitamin E 400 UNIT capsule Take 400 Units by mouth daily.      . metoCLOPramide (REGLAN) 10 MG tablet Take 1 tablet (10 mg total) by mouth every 6 (six) hours as needed (nausea). 30 tablet 0   No current facility-administered medications on file prior to visit.    Review of Systems Constitutional: Negative for increased diaphoresis, other activity, appetite or siginficant weight change other than noted HENT: Negative for worsening hearing loss, ear pain,  facial swelling, mouth sores and neck stiffness.   Eyes: Negative for other worsening pain, redness or visual disturbance.  Respiratory: Negative for shortness of breath and wheezing  Cardiovascular: Negative for chest pain and palpitations.  Gastrointestinal: Negative for diarrhea, blood in stool, abdominal distention or other pain Genitourinary: Negative for hematuria, flank pain or change in urine volume.  Musculoskeletal: Negative for myalgias or other joint complaints.  Skin: Negative for color change and wound or drainage.  Neurological: Negative for syncope and numbness. other than  noted Hematological: Negative for adenopathy. or other swelling Psychiatric/Behavioral: Negative for hallucinations, SI, self-injury, decreased concentration or other worsening agitation.      Objective:   Physical Exam BP 100/58 mmHg  Pulse 70  Temp(Src) 97.6 F (36.4 C) (Oral)  Ht 5\' 5"  (1.651 m)  Wt 147 lb (66.679 kg)  BMI 24.46 kg/m2  SpO2 99% VS noted,  Constitutional: Pt is oriented to person, place, and time. Appears well-developed and well-nourished, in no significant distress Head: Normocephalic and atraumatic.  Right Ear: External ear normal.  Left Ear: External ear normal.  Nose: Nose normal.  Mouth/Throat: Oropharynx is clear and moist.  Eyes: Conjunctivae and EOM are normal. Pupils are equal, round, and reactive to light.  Neck: Normal range of motion. Neck supple. No JVD present. No tracheal deviation present or significant neck LA or mass Cardiovascular: Normal rate, regular rhythm, normal heart sounds and intact distal pulses.   Pulmonary/Chest: Effort normal and breath sounds without rales or wheezing  Abdominal: Soft. Bowel sounds are normal. NT. No HSM  Musculoskeletal: Normal range of motion. Exhibits no edema.  Lymphadenopathy:  Has no cervical adenopathy.  Neurological: Pt is alert and oriented to person, place, and time. Pt has normal reflexes. No cranial nerve deficit. Motor grossly intact Skin: Skin is warm and dry. No rash noted.  Psychiatric:  Has normal mood and affect. Behavior is normal.     Assessment & Plan:

## 2014-11-15 NOTE — Assessment & Plan Note (Addendum)
stable overall by history and exam, recent data reviewed with pt, and pt to continue medical treatment as before,  to f/u any worsening symptoms or concerns Lab Results  Component Value Date   LDLCALC 96 09/19/2013   For labs today, ECG reviewed as per emr

## 2014-11-15 NOTE — Patient Instructions (Addendum)
Your EKG was OK today  Please take all new medication as prescribed - the buspar, and the probiotic  OK to stop the tranxene  Please continue all other medications as before, including the pantoprazole  Please have the pharmacy call with any other refills you may need.  Please continue your efforts at being more active, low cholesterol diet, and weight control.  You are otherwise up to date with prevention measures today, but please call for Nurse Visit if you change your mind about the shots  Please keep your appointments with your specialists as you may have planned  Please go to the LAB in the Basement (turn left off the elevator) for the tests to be done today  You will be contacted by phone if any changes need to be made immediately.  Otherwise, you will receive a letter about your results with an explanation, but please check with MyChart first.  Please remember to sign up for MyChart if you have not done so, as this will be important to you in the future with finding out test results, communicating by private email, and scheduling acute appointments online when needed.  Please return in 1 year for your yearly visit, or sooner if needed

## 2014-11-15 NOTE — Assessment & Plan Note (Signed)
[place] for change tranxene to buspar tid prn,  to f/u any worsening symptoms or concerns

## 2014-12-07 ENCOUNTER — Ambulatory Visit (INDEPENDENT_AMBULATORY_CARE_PROVIDER_SITE_OTHER): Payer: Medicare Other

## 2014-12-07 ENCOUNTER — Ambulatory Visit: Payer: Medicare Other | Admitting: Gastroenterology

## 2014-12-07 DIAGNOSIS — Z23 Encounter for immunization: Secondary | ICD-10-CM

## 2014-12-11 ENCOUNTER — Other Ambulatory Visit (INDEPENDENT_AMBULATORY_CARE_PROVIDER_SITE_OTHER): Payer: Medicare Other

## 2014-12-11 ENCOUNTER — Encounter: Payer: Self-pay | Admitting: Gastroenterology

## 2014-12-11 ENCOUNTER — Ambulatory Visit (INDEPENDENT_AMBULATORY_CARE_PROVIDER_SITE_OTHER): Payer: Medicare Other | Admitting: Gastroenterology

## 2014-12-11 VITALS — BP 110/72 | HR 68 | Ht 65.0 in | Wt 149.4 lb

## 2014-12-11 DIAGNOSIS — K219 Gastro-esophageal reflux disease without esophagitis: Secondary | ICD-10-CM | POA: Diagnosis not present

## 2014-12-11 DIAGNOSIS — R140 Abdominal distension (gaseous): Secondary | ICD-10-CM | POA: Diagnosis not present

## 2014-12-11 LAB — IGA: IgA: 168 mg/dL (ref 68–378)

## 2014-12-11 LAB — IBC PANEL
IRON: 138 ug/dL (ref 42–145)
SATURATION RATIOS: 39.6 % (ref 20.0–50.0)
TRANSFERRIN: 249 mg/dL (ref 212.0–360.0)

## 2014-12-11 LAB — FOLATE: FOLATE: 17.3 ng/mL (ref >5.9)

## 2014-12-11 LAB — FERRITIN: Ferritin: 49.9 ng/mL (ref 10.0–291.0)

## 2014-12-11 LAB — VITAMIN B12: VITAMIN B 12: 476 pg/mL (ref 211–911)

## 2014-12-11 NOTE — Progress Notes (Signed)
HPI :  80 y/o female seen here today for symptoms of gas and bloating. She was last seen in our clinic in 2009 by Dr. Sharlett Iles. She reports a lonstanding history of GERD, mainly pyrosis and regurgitation were her typical GERD symptoms. She had been on nexium for a period of time which controlled these symptoms fairly well, but came off it and then placed on protonix for the past several months. Her heartburn can worsen with acidic foods. Protonix generally controls symptoms well regarding her heartburn and this does not appear to be an active issue at the time.  The main issue she wishes to have addressed today is the feeling of being bloated / belching. She has also had rare nausea but no vomiting. She thinks this has been ongoing for a few months or so. She would feel bloating and gas production after she eats particular foods. No significant abdominal pain, or focal area of "bloating", it can bother her diffusely.  She has tried a probiotic, [employer], which she thinks helped significantly in recent weeks. She reports this significantly improved her symptoms but has not resolved it completely. Symptoms are varied, this does not occur every time she eats, only with certain meals. She reports protonix had at one time caused some diarrhea however over time she has since has not had problems with diarrhea. She is taking protonix 40mg  once daily and tolerates it well.  No dysphagia. She is not vomiting. She denies early satiety, eating okay. Her symptoms are generally intermitent and sporadic. She is not losing weight.   She has a remote history of an endoscopy remotely which she thinks was normal but we don't have the records.  She had a normal colonoscopy in 2009 aside of melanosis coli.     Past Medical History  Diagnosis Date  . Alopecia     stress related and resolved since her Mother moved to SNF  . Glaucoma     uses eye drops daily  . Hypercholesteremia     takes Simvastatin daily  . GERD  (gastroesophageal reflux disease)     takes Nexium daily  . Pneumonia 1960  . H/O hiatal hernia      Past Surgical History  Procedure Laterality Date  . Abdominal hysterectomy      fibroids.  . Colonoscopy    . Knee arthroscopy with medial menisectomy Left 09/12/2013    Procedure: KNEE ARTHROSCOPY WITH DEBRIDEMENT;  Surgeon: Meredith Pel, MD;  Location: [facility];  Service: Orthopedics;  Laterality: Left;   Family History  Problem Relation Age of Onset  . COPD Neg Hx   . Diabetes Neg Hx   . Heart disease Neg Hx   . Stroke Neg Hx    Social History  Substance Use Topics  . Smoking status: Never Smoker   . Smokeless tobacco: Never Used  . Alcohol Use: No   Current Outpatient Prescriptions  Medication Sig Dispense Refill  . bifidobacterium infantis (ALIGN) capsule Take 1 capsule by mouth daily.    . bimatoprost (LUMIGAN) 0.01 % SOLN Place 1 drop into both eyes at bedtime.    . brimonidine-timolol (COMBIGAN) 0.2-0.5 % ophthalmic solution Place 1 drop into both eyes 2 (two) times daily.    . brinzolamide (AZOPT) 1 % ophthalmic suspension Place 1 drop into both eyes 2 (two) times daily.    . busPIRone (BUSPAR) 7.5 MG tablet Take 1 tablet (7.5 mg total) by mouth 3 (three) times daily as needed. 90 tablet 5  .  Coenzyme Q10 (COQ10) 100 MG CAPS Take 100 mg by mouth daily.     . Garlic 245 MG TABS Take 100 mg by mouth daily.    . Magnesium 500 MG TABS Take 1 tablet by mouth daily.    . pantoprazole (PROTONIX) 40 MG tablet Take 40 mg by mouth daily.    . vitamin C (ASCORBIC ACID) 500 MG tablet Take 1,000 mg by mouth daily.     . vitamin E 400 UNIT capsule Take 400 Units by mouth daily.      . metoCLOPramide (REGLAN) 10 MG tablet Take 1 tablet (10 mg total) by mouth every 6 (six) hours as needed (nausea). 30 tablet 0   No current facility-administered medications for this visit.   Allergies  Allergen Reactions  . Atorvastatin     REACTION: INTOL to Lipitor w/ leg pain  .  Penicillins     REACTION: hives  . Protonix [Pantoprazole Sodium] Other (See Comments)    diarrhea     Review of Systems: All systems reviewed and negative except where noted in HPI.   Lab Results  Component Value Date   WBC 6.9 11/15/2014   HGB 11.7* 11/15/2014   HCT 34.7* 11/15/2014   MCV 85.6 11/15/2014   PLT 230.0 11/15/2014   Lab Results  Component Value Date   ALT 9 11/15/2014   AST 15 11/15/2014   ALKPHOS 74 11/15/2014   BILITOT 0.4 11/15/2014   Lab Results  Component Value Date   CREATININE 0.86 11/15/2014   BUN 13 11/15/2014   NA 139 11/15/2014   K 4.7 11/15/2014   CL 105 11/15/2014   CO2 25 11/15/2014      Physical Exam: BP 110/72 mmHg  Pulse 68  Ht 5\' 5"  (1.651 m)  Wt 149 lb 6.4 oz (67.767 kg)  BMI 24.86 kg/m2 Constitutional: Pleasant,well-developed, female in no acute distress. HEENT: Normocephalic and atraumatic. Conjunctivae are normal. No scleral icterus. Neck supple.  Cardiovascular: Normal rate, regular rhythm.  Pulmonary/chest: Effort normal and breath sounds normal. No wheezing, rales or rhonchi. Abdominal: Soft, nondistended, nontender. Bowel sounds active throughout. There are no masses palpable. No hepatomegaly. Extremities: no edema Lymphadenopathy: No cervical adenopathy noted. Neurological: Alert and oriented to person place and time. Skin: Skin is warm and dry. No rashes noted. Psychiatric: Normal mood and affect. Behavior is normal.   ASSESSMENT AND PLAN: 80 y/o female with history of GERD, here for further evaluation of postprandial bloating. History as above. No alarm symptoms, she is improved with a trial of probiotic however has taken it intermittently. Recent labs show a mild normocytic anemia and to further evaluate this will send iron, B12/folate levels, and celiac labs. Otherwise, she appears to have clear trigger foods that precipitate symptoms and suspect she is having some dietary intolerance. Discussed a low FODMOP diet  with her for the treatment of bloating and recommend a trial of it to see if this can help her avoid trigger foods. She can otherwise continue probiotics if she thinks they helped but would recommend she take it daily to assess for response. Otherwise, she should continue protonix for her GERD. I asked her to keep an eye on symptoms and if they don't improve or resolve we can consider endoscopic evaluation, vs empiric trial of rifaximin. If she is iron deficient on labs however we will definitely be recommending endoscopic evaluation. She can follow up in a few months for reassessment otherwise if improved. Of note, she asked about Reglan, as she  was given it and doesn't take it routinely. Recommend she do not take it at this time, try probiotic with low fodmop diet, to make only one change at a time. I would not recommend reglan to treat bloating otherwise. Will try to obtain old EGD report in the interim. She agreed.   Frontenac Cellar, MD [facility] Pager [phone]

## 2014-12-11 NOTE — Patient Instructions (Signed)
Begin Low FODMAP diet.   Your physician has requested that you go to the basement for lab work before leaving today.

## 2014-12-12 LAB — TISSUE TRANSGLUTAMINASE, IGA: TISSUE TRANSGLUTAMINASE AB, IGA: 1 U/mL (ref <4)

## 2015-01-09 DIAGNOSIS — M25562 Pain in left knee: Secondary | ICD-10-CM | POA: Diagnosis not present

## 2015-01-09 DIAGNOSIS — M25462 Effusion, left knee: Secondary | ICD-10-CM | POA: Diagnosis not present

## 2015-02-15 DIAGNOSIS — H40033 Anatomical narrow angle, bilateral: Secondary | ICD-10-CM | POA: Diagnosis not present

## 2015-02-15 DIAGNOSIS — H3581 Retinal edema: Secondary | ICD-10-CM | POA: Insufficient documentation

## 2015-03-01 ENCOUNTER — Encounter: Payer: Self-pay | Admitting: Internal Medicine

## 2015-03-01 ENCOUNTER — Ambulatory Visit (INDEPENDENT_AMBULATORY_CARE_PROVIDER_SITE_OTHER): Payer: Medicare Other | Admitting: Internal Medicine

## 2015-03-01 ENCOUNTER — Ambulatory Visit (INDEPENDENT_AMBULATORY_CARE_PROVIDER_SITE_OTHER)
Admission: RE | Admit: 2015-03-01 | Discharge: 2015-03-01 | Disposition: A | Discharge status: Home / Self Care | Payer: Medicare Other | Source: Ambulatory Visit | Attending: Internal Medicine | Admitting: Internal Medicine

## 2015-03-01 VITALS — BP 110/64 | HR 97 | Temp 98.6°F | Ht 63.0 in | Wt 150.0 lb

## 2015-03-01 DIAGNOSIS — R05 Cough: Secondary | ICD-10-CM | POA: Diagnosis not present

## 2015-03-01 DIAGNOSIS — F411 Generalized anxiety disorder: Secondary | ICD-10-CM | POA: Diagnosis not present

## 2015-03-01 DIAGNOSIS — T7840XS Allergy, unspecified, sequela: Secondary | ICD-10-CM | POA: Diagnosis not present

## 2015-03-01 DIAGNOSIS — R059 Cough, unspecified: Secondary | ICD-10-CM

## 2015-03-01 DIAGNOSIS — R079 Chest pain, unspecified: Secondary | ICD-10-CM | POA: Diagnosis not present

## 2015-03-01 MED ORDER — AZITHROMYCIN 250 MG PO TABS: ORAL_TABLET | ORAL | Status: DC

## 2015-03-01 NOTE — Patient Instructions (Addendum)
.  Please take all new medication as prescribed - the antibiotic  Please continue all other medications as before, and refills have been done if requested.  Please have the pharmacy call with any other refills you may need.  Please keep your appointments with your specialists as you may have planned  Please go to the XRAY Department in the Basement (go straight as you get off the elevator) for the x-ray testing  You will be contacted by phone if any changes need to be made immediately.  Otherwise, you will receive a letter about your results with an explanation, but please check with MyChart first.  Please remember to sign up for MyChart if you have not done so, as this will be important to you in the future with finding out test results, communicating by private email, and scheduling acute appointments online when needed.  Please return in 6 months, or sooner if needed

## 2015-03-01 NOTE — Assessment & Plan Note (Signed)
Mild to mod, cw bronchitis vs atypical pna, for antibx course, check cxr,  to f/u any worsening symptoms or concerns

## 2015-03-01 NOTE — Assessment & Plan Note (Signed)
Ok to continue the claritin D prn,  to f/u any worsening symptoms or concerns

## 2015-03-01 NOTE — Assessment & Plan Note (Signed)
stable overall by history and exam, recent data reviewed with pt, and pt to continue medical treatment as before,  to f/u any worsening symptoms or concerns Lab Results  Component Value Date   WBC 6.9 11/15/2014   HGB 11.7* 11/15/2014   HCT 34.7* 11/15/2014   PLT 230.0 11/15/2014   GLUCOSE 92 11/15/2014   CHOL 169 11/15/2014   TRIG 92.0 11/15/2014   HDL 53.70 11/15/2014   LDLDIRECT 223.7 12/14/2009   LDLCALC 97 11/15/2014   ALT 9 11/15/2014   AST 15 11/15/2014   NA 139 11/15/2014   K 4.7 11/15/2014   CL 105 11/15/2014   CREATININE 0.86 11/15/2014   BUN 13 11/15/2014   CO2 25 11/15/2014   TSH 1.19 11/15/2014

## 2015-03-01 NOTE — Progress Notes (Signed)
Subjective:    Patient ID: Hannah Madden, female    DOB: 08-25-1934, 80 y.o.   MRN: [id number]  HPI  Here with acute onset mild to mod 2-3 days ST, HA, general weakness and malaise, with cough no prod sputum, but Pt denies chest pain, increased sob or doe, wheezing, orthopnea, PND, increased LE swelling, palpitations, dizziness or syncope, except for mid back pain with inspiration. No high fevers, chills.  Pt denies new neurological symptoms such as new headache, or facial or extremity weakness or numbness   Pt denies polydipsia, polyuria  Claritin D not helping symptoms .  Was around sick 2yo grandchild a few days ago.  Does have several wks ongoing nasal allergy symptoms with clearish congestion, itch and sneezing, without fever, pain, ST, cough, swelling or wheezing  Denies worsening depressive symptoms, suicidal ideation, or panic Past Medical History  Diagnosis Date  . Alopecia     stress related and resolved since her Mother moved to SNF  . Glaucoma     uses eye drops daily  . Hypercholesteremia     takes Simvastatin daily  . GERD (gastroesophageal reflux disease)     takes Nexium daily  . Pneumonia 1960  . H/O hiatal hernia    Past Surgical History  Procedure Laterality Date  . Abdominal hysterectomy      fibroids.  . Colonoscopy    . Knee arthroscopy with medial menisectomy Left 09/12/2013    Procedure: KNEE ARTHROSCOPY WITH DEBRIDEMENT;  Surgeon: Meredith Pel, MD;  Location: [facility];  Service: Orthopedics;  Laterality: Left;    reports that she has never smoked. She has never used smokeless tobacco. She reports that she does not drink alcohol or use illicit drugs. family history is negative for COPD, Diabetes, Heart disease, and Stroke. Allergies  Allergen Reactions  . Atorvastatin     REACTION: INTOL to Lipitor w/ leg pain  . Penicillins     REACTION: hives  . Protonix [Pantoprazole Sodium] Other (See Comments)    diarrhea   Current Outpatient Prescriptions on  File Prior to Visit  Medication Sig Dispense Refill  . bifidobacterium infantis (ALIGN) capsule Take 1 capsule by mouth daily.    . bimatoprost (LUMIGAN) 0.01 % SOLN Place 1 drop into both eyes at bedtime.    . brimonidine-timolol (COMBIGAN) 0.2-0.5 % ophthalmic solution Place 1 drop into both eyes 2 (two) times daily.    . brinzolamide (AZOPT) 1 % ophthalmic suspension Place 1 drop into both eyes 2 (two) times daily.    . busPIRone (BUSPAR) 7.5 MG tablet Take 1 tablet (7.5 mg total) by mouth 3 (three) times daily as needed. 90 tablet 5  . Coenzyme Q10 (COQ10) 100 MG CAPS Take 100 mg by mouth daily.     . Garlic 123XX123 MG TABS Take 100 mg by mouth daily.    . Magnesium 500 MG TABS Take 1 tablet by mouth daily.    . pantoprazole (PROTONIX) 40 MG tablet Take 40 mg by mouth daily.    . vitamin C (ASCORBIC ACID) 500 MG tablet Take 1,000 mg by mouth daily.     . vitamin E 400 UNIT capsule Take 400 Units by mouth daily.      . metoCLOPramide (REGLAN) 10 MG tablet Take 1 tablet (10 mg total) by mouth every 6 (six) hours as needed (nausea). 30 tablet 0   No current facility-administered medications on file prior to visit.   Review of Systems  Constitutional: Negative for unusual  diaphoresis or night sweats HENT: Negative for ringing in ear or discharge Eyes: Negative for double vision or worsening visual disturbance.  Respiratory: Negative for choking and stridor.   Gastrointestinal: Negative for vomiting or other signifcant bowel change Genitourinary: Negative for hematuria or change in urine volume.  Musculoskeletal: Negative for other MSK pain or swelling Skin: Negative for color change and worsening wound.  Neurological: Negative for tremors and numbness other than noted  Psychiatric/Behavioral: Negative for decreased concentration or agitation other than above       Objective:   Physical Exam BP 110/64 mmHg  Pulse 97  Temp(Src) 98.6 F (37 C) (Oral)  Ht 5\' 3"  (1.6 m)  Wt 150 lb (68.04  kg)  BMI 26.58 kg/m2  SpO2 96% VS noted,  Constitutional: Pt appears in no significant distress HENT: Head: NCAT.  Right Ear: External ear normal.  Left Ear: External ear normal.  Eyes: . Pupils are equal, round, and reactive to light. Conjunctivae and EOM are normal Bilat tm's with mild erythema.  Max sinus areas non tender.  Pharynx with mod erythema, no exudate Neck: Normal range of motion. Neck supple. but with bilat submandib LA tender Cardiovascular: Normal rate and regular rhythm.   Pulmonary/Chest: Effort normal and breath sounds somewhat decresaed without rales or wheezing.  Neurological: Pt is alert. Not confused , motor grossly intact Skin: Skin is warm. No rash, no LE edema Psychiatric: Pt behavior is normal. No agitation.     Assessment & Plan:

## 2015-03-01 NOTE — Progress Notes (Signed)
Pre visit review using our clinic review tool, if applicable. No additional management support is needed unless otherwise documented below in the visit note. 

## 2015-03-02 ENCOUNTER — Telehealth: Payer: Self-pay

## 2015-03-02 MED ORDER — LEVOFLOXACIN 250 MG PO TABS: 250.0000 mg | ORAL_TABLET | Freq: Every day | ORAL | Status: DC

## 2015-03-02 NOTE — Telephone Encounter (Signed)
Ok to d/c the zpack  levaquin done erx

## 2015-03-02 NOTE — Telephone Encounter (Signed)
Pt came into the office stating that Z-pak that was prescribed yesterday 03/01/2015, it causing severe nausea. Pt is requesting alternative antibiotic, please advise

## 2015-03-02 NOTE — Telephone Encounter (Signed)
Pt has called back regarding getting a new prescription. She is also concerned about how to take the new medication since she has already taken some of the Zpak

## 2015-03-02 NOTE — Telephone Encounter (Signed)
Pt advised.

## 2015-03-22 DIAGNOSIS — H40033 Anatomical narrow angle, bilateral: Secondary | ICD-10-CM | POA: Diagnosis not present

## 2015-03-23 DIAGNOSIS — H401132 Primary open-angle glaucoma, bilateral, moderate stage: Secondary | ICD-10-CM | POA: Diagnosis not present

## 2015-03-26 DIAGNOSIS — H3582 Retinal ischemia: Secondary | ICD-10-CM | POA: Diagnosis not present

## 2015-03-26 DIAGNOSIS — H34811 Central retinal vein occlusion, right eye, with macular edema: Secondary | ICD-10-CM | POA: Diagnosis not present

## 2015-03-26 DIAGNOSIS — H43813 Vitreous degeneration, bilateral: Secondary | ICD-10-CM | POA: Diagnosis not present

## 2015-03-27 DIAGNOSIS — H401134 Primary open-angle glaucoma, bilateral, indeterminate stage: Secondary | ICD-10-CM | POA: Diagnosis not present

## 2015-04-04 DIAGNOSIS — H402224 Chronic angle-closure glaucoma, left eye, indeterminate stage: Secondary | ICD-10-CM | POA: Diagnosis not present

## 2015-04-04 DIAGNOSIS — H3581 Retinal edema: Secondary | ICD-10-CM | POA: Diagnosis not present

## 2015-04-04 DIAGNOSIS — H53123 Transient visual loss, bilateral: Secondary | ICD-10-CM | POA: Diagnosis not present

## 2015-05-11 DIAGNOSIS — H402223 Chronic angle-closure glaucoma, left eye, severe stage: Secondary | ICD-10-CM | POA: Diagnosis not present

## 2015-06-22 ENCOUNTER — Other Ambulatory Visit: Payer: Self-pay | Admitting: *Deleted

## 2015-06-22 MED ORDER — PANTOPRAZOLE SODIUM 40 MG PO TBEC: 40.0000 mg | DELAYED_RELEASE_TABLET | Freq: Every day | ORAL | Status: DC

## 2015-06-22 NOTE — Telephone Encounter (Signed)
Receive call pt states she is needing refills on her pantoprazole. Inform pt will send to [facility]...Johny Chess

## 2015-06-25 DIAGNOSIS — H34811 Central retinal vein occlusion, right eye, with macular edema: Secondary | ICD-10-CM | POA: Diagnosis not present

## 2015-06-25 DIAGNOSIS — H3582 Retinal ischemia: Secondary | ICD-10-CM | POA: Diagnosis not present

## 2015-06-25 DIAGNOSIS — H43813 Vitreous degeneration, bilateral: Secondary | ICD-10-CM | POA: Diagnosis not present

## 2015-07-10 DIAGNOSIS — H34811 Central retinal vein occlusion, right eye, with macular edema: Secondary | ICD-10-CM | POA: Diagnosis not present

## 2015-07-10 DIAGNOSIS — H26053 Posterior subcapsular polar infantile and juvenile cataract, bilateral: Secondary | ICD-10-CM | POA: Diagnosis not present

## 2015-07-10 DIAGNOSIS — H401122 Primary open-angle glaucoma, left eye, moderate stage: Secondary | ICD-10-CM | POA: Diagnosis not present

## 2015-07-10 DIAGNOSIS — H2513 Age-related nuclear cataract, bilateral: Secondary | ICD-10-CM | POA: Diagnosis not present

## 2015-07-10 DIAGNOSIS — H401111 Primary open-angle glaucoma, right eye, mild stage: Secondary | ICD-10-CM | POA: Diagnosis not present

## 2015-08-13 ENCOUNTER — Other Ambulatory Visit: Payer: Self-pay | Admitting: *Deleted

## 2015-08-13 MED ORDER — PANTOPRAZOLE SODIUM 40 MG PO TBEC: 40.0000 mg | DELAYED_RELEASE_TABLET | Freq: Every day | ORAL | Status: DC

## 2015-08-23 DIAGNOSIS — H402223 Chronic angle-closure glaucoma, left eye, severe stage: Secondary | ICD-10-CM | POA: Diagnosis not present

## 2015-08-23 DIAGNOSIS — H269 Unspecified cataract: Secondary | ICD-10-CM | POA: Diagnosis not present

## 2015-08-23 DIAGNOSIS — H4089 Other specified glaucoma: Secondary | ICD-10-CM | POA: Diagnosis not present

## 2015-08-23 DIAGNOSIS — H40033 Anatomical narrow angle, bilateral: Secondary | ICD-10-CM | POA: Diagnosis not present

## 2015-08-23 DIAGNOSIS — H53412 Scotoma involving central area, left eye: Secondary | ICD-10-CM | POA: Diagnosis not present

## 2015-08-23 DIAGNOSIS — H2513 Age-related nuclear cataract, bilateral: Secondary | ICD-10-CM | POA: Diagnosis not present

## 2015-08-27 DIAGNOSIS — H35371 Puckering of macula, right eye: Secondary | ICD-10-CM | POA: Diagnosis not present

## 2015-08-27 DIAGNOSIS — H34831 Tributary (branch) retinal vein occlusion, right eye, with macular edema: Secondary | ICD-10-CM | POA: Diagnosis not present

## 2015-08-27 DIAGNOSIS — H402223 Chronic angle-closure glaucoma, left eye, severe stage: Secondary | ICD-10-CM | POA: Diagnosis not present

## 2015-08-29 ENCOUNTER — Telehealth: Payer: Self-pay | Admitting: Internal Medicine

## 2015-08-29 DIAGNOSIS — Z0000 Encounter for general adult medical examination without abnormal findings: Secondary | ICD-10-CM

## 2015-08-29 NOTE — Telephone Encounter (Signed)
Patient is requesting lab work before her visit. Please advise the patient.

## 2015-08-29 NOTE — Telephone Encounter (Signed)
Orders have been placed.

## 2015-09-07 DIAGNOSIS — H34831 Tributary (branch) retinal vein occlusion, right eye, with macular edema: Secondary | ICD-10-CM | POA: Diagnosis not present

## 2015-09-07 DIAGNOSIS — H3581 Retinal edema: Secondary | ICD-10-CM | POA: Diagnosis not present

## 2015-09-07 DIAGNOSIS — Z79899 Other long term (current) drug therapy: Secondary | ICD-10-CM | POA: Diagnosis not present

## 2015-10-08 DIAGNOSIS — H2513 Age-related nuclear cataract, bilateral: Secondary | ICD-10-CM | POA: Diagnosis not present

## 2015-10-08 DIAGNOSIS — H59031 Cystoid macular edema following cataract surgery, right eye: Secondary | ICD-10-CM | POA: Diagnosis not present

## 2015-10-08 DIAGNOSIS — H34831 Tributary (branch) retinal vein occlusion, right eye, with macular edema: Secondary | ICD-10-CM | POA: Diagnosis not present

## 2015-10-08 DIAGNOSIS — H35371 Puckering of macula, right eye: Secondary | ICD-10-CM | POA: Diagnosis not present

## 2015-10-08 DIAGNOSIS — H40033 Anatomical narrow angle, bilateral: Secondary | ICD-10-CM | POA: Diagnosis not present

## 2015-10-08 DIAGNOSIS — Z79899 Other long term (current) drug therapy: Secondary | ICD-10-CM | POA: Diagnosis not present

## 2015-10-09 ENCOUNTER — Other Ambulatory Visit: Payer: Self-pay | Admitting: Internal Medicine

## 2015-10-09 DIAGNOSIS — Z1231 Encounter for screening mammogram for malignant neoplasm of breast: Secondary | ICD-10-CM

## 2015-10-15 DIAGNOSIS — H25043 Posterior subcapsular polar age-related cataract, bilateral: Secondary | ICD-10-CM | POA: Diagnosis not present

## 2015-10-15 DIAGNOSIS — H401123 Primary open-angle glaucoma, left eye, severe stage: Secondary | ICD-10-CM | POA: Diagnosis not present

## 2015-10-15 DIAGNOSIS — H34811 Central retinal vein occlusion, right eye, with macular edema: Secondary | ICD-10-CM | POA: Diagnosis not present

## 2015-10-15 DIAGNOSIS — H401112 Primary open-angle glaucoma, right eye, moderate stage: Secondary | ICD-10-CM | POA: Diagnosis not present

## 2015-10-15 DIAGNOSIS — H2513 Age-related nuclear cataract, bilateral: Secondary | ICD-10-CM | POA: Diagnosis not present

## 2015-10-24 ENCOUNTER — Encounter: Payer: Medicare Other | Admitting: Internal Medicine

## 2015-10-25 ENCOUNTER — Other Ambulatory Visit (INDEPENDENT_AMBULATORY_CARE_PROVIDER_SITE_OTHER): Payer: Medicare PPO

## 2015-10-25 ENCOUNTER — Telehealth: Payer: Self-pay

## 2015-10-25 ENCOUNTER — Ambulatory Visit (INDEPENDENT_AMBULATORY_CARE_PROVIDER_SITE_OTHER): Payer: Medicare PPO | Admitting: Internal Medicine

## 2015-10-25 ENCOUNTER — Encounter: Payer: Self-pay | Admitting: Internal Medicine

## 2015-10-25 VITALS — BP 120/82 | HR 79 | Temp 98.1°F | Resp 20 | Wt 152.0 lb

## 2015-10-25 DIAGNOSIS — T7840XS Allergy, unspecified, sequela: Secondary | ICD-10-CM

## 2015-10-25 DIAGNOSIS — F411 Generalized anxiety disorder: Secondary | ICD-10-CM | POA: Diagnosis not present

## 2015-10-25 DIAGNOSIS — Z0001 Encounter for general adult medical examination with abnormal findings: Secondary | ICD-10-CM

## 2015-10-25 DIAGNOSIS — R6889 Other general symptoms and signs: Secondary | ICD-10-CM | POA: Diagnosis not present

## 2015-10-25 DIAGNOSIS — E7800 Pure hypercholesterolemia, unspecified: Secondary | ICD-10-CM

## 2015-10-25 DIAGNOSIS — R7989 Other specified abnormal findings of blood chemistry: Secondary | ICD-10-CM

## 2015-10-25 LAB — CBC WITH DIFFERENTIAL/PLATELET
BASOS ABS: 0 10*3/uL (ref 0.0–0.1)
Basophils Relative: 0.2 % (ref 0.0–3.0)
Eosinophils Absolute: 0.1 10*3/uL (ref 0.0–0.7)
Eosinophils Relative: 1.2 % (ref 0.0–5.0)
HCT: 32.7 % — ABNORMAL LOW (ref 36.0–46.0)
Hemoglobin: 11.1 g/dL — ABNORMAL LOW (ref 12.0–15.0)
LYMPHS ABS: 3 10*3/uL (ref 0.7–4.0)
Lymphocytes Relative: 37.4 % (ref 12.0–46.0)
MCHC: 33.9 g/dL (ref 30.0–36.0)
MCV: 85.1 fl (ref 78.0–100.0)
MONO ABS: 0.7 10*3/uL (ref 0.1–1.0)
Monocytes Relative: 8.6 % (ref 3.0–12.0)
NEUTROS PCT: 52.6 % (ref 43.0–77.0)
Neutro Abs: 4.3 10*3/uL (ref 1.4–7.7)
Platelets: 240 10*3/uL (ref 150.0–400.0)
RBC: 3.85 Mil/uL — AB (ref 3.87–5.11)
RDW: 13.7 % (ref 11.5–15.5)
WBC: 8.1 10*3/uL (ref 4.0–10.5)

## 2015-10-25 LAB — LIPID PANEL
CHOLESTEROL: 240 mg/dL — AB (ref 0–200)
HDL: 50.3 mg/dL (ref >39.00)
NonHDL: 189.89
Total CHOL/HDL Ratio: 5
Triglycerides: 206 mg/dL — ABNORMAL HIGH (ref 0.0–149.0)
VLDL: 41.2 mg/dL — ABNORMAL HIGH (ref 0.0–40.0)

## 2015-10-25 LAB — HEPATIC FUNCTION PANEL
ALBUMIN: 4.1 g/dL (ref 3.5–5.2)
ALK PHOS: 78 U/L (ref 39–117)
ALT: 9 U/L (ref 0–35)
AST: 16 U/L (ref 0–37)
Bilirubin, Direct: 0 mg/dL (ref 0.0–0.3)
TOTAL PROTEIN: 6.7 g/dL (ref 6.0–8.3)
Total Bilirubin: 0.2 mg/dL (ref 0.2–1.2)

## 2015-10-25 LAB — BASIC METABOLIC PANEL
BUN: 10 mg/dL (ref 6–23)
CALCIUM: 8.9 mg/dL (ref 8.4–10.5)
CO2: 27 meq/L (ref 19–32)
Chloride: 102 mEq/L (ref 96–112)
Creatinine, Ser: 0.93 mg/dL (ref 0.40–1.20)
GFR: 74.82 mL/min (ref >60.00)
GLUCOSE: 95 mg/dL (ref 70–99)
POTASSIUM: 4.3 meq/L (ref 3.5–5.1)
SODIUM: 134 meq/L — AB (ref 135–145)

## 2015-10-25 LAB — TSH: TSH: 1.14 u[IU]/mL (ref 0.35–4.50)

## 2015-10-25 LAB — LDL CHOLESTEROL, DIRECT: Direct LDL: 152 mg/dL

## 2015-10-25 MED ORDER — CLORAZEPATE DIPOTASSIUM 7.5 MG PO TABS: 7.5000 mg | ORAL_TABLET | Freq: Two times a day (BID) | ORAL | 2 refills | Status: DC | PRN

## 2015-10-25 MED ORDER — ASPIRIN EC 81 MG PO TBEC: 81.0000 mg | DELAYED_RELEASE_TABLET | Freq: Every day | ORAL | 99 refills | Status: DC

## 2015-10-25 MED ORDER — SIMVASTATIN 20 MG PO TABS: 20.0000 mg | ORAL_TABLET | Freq: Every day | ORAL | 3 refills | Status: DC

## 2015-10-25 NOTE — Assessment & Plan Note (Addendum)
For med restart, o/w stable overall by history and exam, recent data reviewed with pt, and pt to continue medical treatment as before,  to f/u any worsening symptoms or concerns  Lab Results  Component Value Date   LDLCALC 97 11/15/2014    to f/u any worsening symptoms or concerns

## 2015-10-25 NOTE — Assessment & Plan Note (Signed)

## 2015-10-25 NOTE — Assessment & Plan Note (Addendum)
midl to mod worsening, for tranxene bid prn,  to f/u any worsening symptoms or concerns, declines counseling referral, no depression,  to f/u any worsening symptoms or concerns  In addition to the time spent performing CPE, I spent an additional 25 minutes face to face,in which greater than 50% of this time was spent in counseling and coordination of care for patient's acute illness as documented.

## 2015-10-25 NOTE — Progress Notes (Signed)
Subjective:    Patient ID: Hannah Madden, female    DOB: Aug 18, 1934, 81 y.o.   MRN: [id number]  HPI  Here for wellness and f/u;  Overall doing ok;  Pt denies Chest pain, worsening SOB, DOE, wheezing, orthopnea, PND, worsening LE edema, palpitations, dizziness or syncope.  Pt denies neurological change such as new headache, facial or extremity weakness.  Pt denies polydipsia, polyuria, or low sugar symptoms. Pt states overall good compliance with treatment and medications, good tolerability, and has been trying to follow appropriate diet. No fever, night sweats, wt loss, loss of appetite, or other constitutional symptoms.  Pt states good ability with ADL's, has low fall risk, home safety reviewed and adequate, no other significant changes in hearing or vision, and occasionally active with exercise, though more lately with walking and gym time up to 1.5 hrs per day  Has gained several lbs.  Legs were sore to start, but now better, and back to taking her vitamins and magnesium.   BP Readings from Last 3 Encounters:  10/25/15 120/82  03/01/15 110/64  12/11/14 110/72   Wt Readings from Last 3 Encounters:  10/25/15 152 lb (68.9 kg)  03/01/15 150 lb (68 kg)  12/11/14 149 lb 6.4 oz (67.8 kg)   Denies worsening depressive symptoms, suicidal ideation, or panic; has ongoing anxiety, increased recently.    For years has had prn clorazepate per Dr Lenna Gilford.  Has had difficulty getting to sleep some nights.  Husband with new lung nodule, for eval soon.  Denies worsening reflux, abd pain, dysphagia, n/v, bowel change or blood.    Does have several wks ongoing nasal allergy symptoms with clearish congestion, itch and sneezing, without fever, pain, ST, cough, swelling or wheezing. Past Medical History:  Diagnosis Date  . Alopecia    stress related and resolved since her Mother moved to SNF  . GERD (gastroesophageal reflux disease)    takes Nexium daily  . Glaucoma    uses eye drops daily  . H/O hiatal  hernia   . Hypercholesteremia    takes Simvastatin daily  . Pneumonia 1960   Past Surgical History:  Procedure Laterality Date  . ABDOMINAL HYSTERECTOMY     fibroids.  . COLONOSCOPY    . KNEE ARTHROSCOPY WITH MEDIAL MENISECTOMY Left 09/12/2013   Procedure: KNEE ARTHROSCOPY WITH DEBRIDEMENT;  Surgeon: Meredith Pel, MD;  Location: [facility];  Service: Orthopedics;  Laterality: Left;    reports that she has never smoked. She has never used smokeless tobacco. She reports that she does not drink alcohol or use drugs. family history is not on file. Allergies  Allergen Reactions  . Atorvastatin     REACTION: INTOL to Lipitor w/ leg pain  . Penicillins     REACTION: hives  . Protonix [Pantoprazole Sodium] Other (See Comments)    diarrhea   Current Outpatient Prescriptions on File Prior to Visit  Medication Sig Dispense Refill  . bifidobacterium infantis (ALIGN) capsule Take 1 capsule by mouth daily.    . bimatoprost (LUMIGAN) 0.01 % SOLN Place 1 drop into both eyes at bedtime.    . brimonidine-timolol (COMBIGAN) 0.2-0.5 % ophthalmic solution Place 1 drop into both eyes 2 (two) times daily.    . brinzolamide (AZOPT) 1 % ophthalmic suspension Place 1 drop into both eyes 2 (two) times daily.    . busPIRone (BUSPAR) 7.5 MG tablet Take 1 tablet (7.5 mg total) by mouth 3 (three) times daily as needed. 90 tablet 5  .  Coenzyme Q10 (COQ10) 100 MG CAPS Take 100 mg by mouth daily.     . Garlic 123XX123 MG TABS Take 100 mg by mouth daily.    . Magnesium 500 MG TABS Take 1 tablet by mouth daily.    . pantoprazole (PROTONIX) 40 MG tablet Take 1 tablet (40 mg total) by mouth daily. 90 tablet 1  . vitamin C (ASCORBIC ACID) 500 MG tablet Take 1,000 mg by mouth daily.     . vitamin E 400 UNIT capsule Take 400 Units by mouth daily.       No current facility-administered medications on file prior to visit.    Review of Systems Constitutional: Negative for increased diaphoresis, or other activity, appetite  or siginficant weight change other than noted HENT: Negative for worsening hearing loss, ear pain, facial swelling, mouth sores and neck stiffness.   Eyes: Negative for other worsening pain, redness or visual disturbance.  Respiratory: Negative for choking or stridor Cardiovascular: Negative for other chest pain and palpitations.  Gastrointestinal: Negative for worsening diarrhea, blood in stool, or abdominal distention Genitourinary: Negative for hematuria, flank pain or change in urine volume.  Musculoskeletal: Negative for myalgias or other joint complaints.  Skin: Negative for other color change and wound or drainage.  Neurological: Negative for syncope and numbness. other than noted Hematological: Negative for adenopathy. or other swelling Psychiatric/Behavioral: Negative for hallucinations, SI, self-injury, decreased concentration or other worsening agitation.       Objective:   Physical Exam BP 120/82   Pulse 79   Temp 98.1 F (36.7 C) (Oral)   Resp 20   Wt 152 lb (68.9 kg)   SpO2 99%   BMI 26.93 kg/m  VS noted,  Constitutional: Pt is oriented to person, place, and time. Appears well-developed and well-nourished, in no significant distress Head: Normocephalic and atraumatic  Eyes: Conjunctivae and EOM are normal. Pupils are equal, round, and reactive to light Right Ear: External ear normal.  Left Ear: External ear normal Nose: Nose normal.  Bilat tm's with mild erythema.  Max sinus areas non tender.  Pharynx with mild erythema, no exudate Mouth/Throat: Oropharynx is clear and moist  Neck: Normal range of motion. Neck supple. No JVD present. No tracheal deviation present or significant neck LA or mass Cardiovascular: Normal rate, regular rhythm, normal heart sounds and intact distal pulses.   Pulmonary/Chest: Effort normal and breath sounds without rales or wheezing  Abdominal: Soft. Bowel sounds are normal. NT. No HSM  Musculoskeletal: Normal range of motion. Exhibits no  edema Lymphadenopathy: Has no cervical adenopathy.  Neurological: Pt is alert and oriented to person, place, and time. Pt has normal reflexes. No cranial nerve deficit. Motor grossly intact Skin: Skin is warm and dry. No rash noted or new ulcers Psychiatric:  Has nervous mood and affect. Behavior is normal.      Assessment & Plan:

## 2015-10-25 NOTE — Progress Notes (Signed)
   Subjective:    Patient ID: Hannah Madden, female    DOB: [birth year], 81 y.o.   MRN: [id number]  HPI    Review of Systems     Objective:   Physical Exam        Assessment & Plan:

## 2015-10-25 NOTE — Telephone Encounter (Signed)
Called patient and was informed. Patient made an appointment. Thank you.

## 2015-10-25 NOTE — Telephone Encounter (Signed)
Patient in asking for a refill on Simvastatin 20 mg. She states she hasn't took it in a while. And I don't see it on her medication list. I informed patient she would need to make an appointment for this request. But she wanted to ask at first because she states she has been on it off and on for years. If she does need an appointment she states when she comes for her CPE in September she will request then.

## 2015-10-25 NOTE — Progress Notes (Signed)
Pre visit review using our clinic review tool, if applicable. No additional management support is needed unless otherwise documented below in the visit note. 

## 2015-10-25 NOTE — Patient Instructions (Addendum)
Please take all new medication as prescribed - the clorazepate as needed for nerves  Please continue all other medications as before, and refills have been done if requested - the simvastatin 20 mg  Please also start Aspirin 81 mg - 1 per day - to help reduce the risk of heart disease and stroke  Please have the pharmacy call with any other refills you may need.  Please continue your efforts at being more active, low cholesterol diet, and weight control.  You are otherwise up to date with prevention measures today.  Please keep your appointments with your specialists as you may have planned  Please go to the LAB in the Basement (turn left off the elevator) for the tests to be done today  You will be contacted by phone if any changes need to be made immediately.  Otherwise, you will receive a letter about your results with an explanation, but please check with MyChart first.  Please remember to sign up for MyChart if you have not done so, as this will be important to you in the future with finding out test results, communicating by private email, and scheduling acute appointments online when needed.  Please return in 1 year for your yearly visit, or sooner if needed, with Lab testing done 3-5 days before  '

## 2015-10-25 NOTE — Assessment & Plan Note (Signed)
Ok for [employer] and nasacort prn,  to f/u any worsening symptoms or concerns

## 2015-10-26 ENCOUNTER — Telehealth: Payer: Self-pay

## 2015-10-26 LAB — URINALYSIS, ROUTINE W REFLEX MICROSCOPIC
Bilirubin Urine: NEGATIVE
Hgb urine dipstick: NEGATIVE
KETONES UR: NEGATIVE
Nitrite: NEGATIVE
PH: 7 (ref 5.0–8.0)
SPECIFIC GRAVITY, URINE: 1.01 (ref 1.000–1.030)
TOTAL PROTEIN, URINE-UPE24: NEGATIVE
URINE GLUCOSE: NEGATIVE
UROBILINOGEN UA: 0.2 (ref 0.0–1.0)

## 2015-10-26 MED ORDER — SIMVASTATIN 20 MG PO TABS: 20.0000 mg | ORAL_TABLET | Freq: Every day | ORAL | 3 refills | Status: DC

## 2015-10-26 NOTE — Telephone Encounter (Signed)
simvastatin (ZOCOR) 20 MG tablet   Patient is requesting a rx for this. [facility]  [facility] on [address].

## 2015-10-26 NOTE — Telephone Encounter (Signed)
Medication sent to new pharmacy

## 2015-10-31 ENCOUNTER — Telehealth: Payer: Self-pay | Admitting: Emergency Medicine

## 2015-10-31 NOTE — Telephone Encounter (Signed)
Placed copy of labs upfront. Thanks.

## 2015-10-31 NOTE — Telephone Encounter (Signed)
Pt called and stated she cant read the lab results she picked up. She wants a copy of just the labs from 10/26/2015 not all the past years as well. She asked if you can give her a call back and possibly print her a more readable copy. Please advise thanks.

## 2015-11-13 DIAGNOSIS — H43813 Vitreous degeneration, bilateral: Secondary | ICD-10-CM | POA: Diagnosis not present

## 2015-11-13 DIAGNOSIS — H34811 Central retinal vein occlusion, right eye, with macular edema: Secondary | ICD-10-CM | POA: Diagnosis not present

## 2015-11-13 DIAGNOSIS — H3582 Retinal ischemia: Secondary | ICD-10-CM | POA: Diagnosis not present

## 2015-11-16 ENCOUNTER — Ambulatory Visit
Admission: RE | Admit: 2015-11-16 | Discharge: 2015-11-16 | Disposition: A | Discharge status: Home / Self Care | Payer: Medicare PPO | Source: Ambulatory Visit | Attending: Internal Medicine | Admitting: Internal Medicine

## 2015-11-16 DIAGNOSIS — Z1231 Encounter for screening mammogram for malignant neoplasm of breast: Secondary | ICD-10-CM

## 2015-11-22 ENCOUNTER — Ambulatory Visit (INDEPENDENT_AMBULATORY_CARE_PROVIDER_SITE_OTHER): Payer: Medicare PPO | Admitting: Internal Medicine

## 2015-11-22 VITALS — BP 118/64 | HR 62 | Temp 98.0°F | Resp 20 | Wt 149.0 lb

## 2015-11-22 DIAGNOSIS — E7800 Pure hypercholesterolemia, unspecified: Secondary | ICD-10-CM | POA: Diagnosis not present

## 2015-11-22 DIAGNOSIS — T7840XS Allergy, unspecified, sequela: Secondary | ICD-10-CM | POA: Diagnosis not present

## 2015-11-22 DIAGNOSIS — K219 Gastro-esophageal reflux disease without esophagitis: Secondary | ICD-10-CM

## 2015-11-22 DIAGNOSIS — F411 Generalized anxiety disorder: Secondary | ICD-10-CM | POA: Diagnosis not present

## 2015-11-22 NOTE — Patient Instructions (Signed)
Please continue all other medications as before, and refills have been done if requested.  Please have the pharmacy call with any other refills you may need.  Please continue your efforts at being more active, low cholesterol diet, and weight control.  Please keep your appointments with your specialists as you may have planned  Please return in 1 year for your yearly visit, or sooner if needed, with Lab testing done 3-5 days before

## 2015-11-22 NOTE — Progress Notes (Signed)
Subjective:    Patient ID: Hannah Madden, female    DOB: 1934/04/27, 81 y.o.   MRN: [id number]  HPI  Here to f/u; overall doing ok,  Pt denies chest pain, increasing sob or doe, wheezing, orthopnea, PND, increased LE swelling, palpitations, dizziness or syncope.  Pt denies new neurological symptoms such as new headache, or facial or extremity weakness or numbness.  Pt denies polydipsia, polyuria, or low sugar episode.   Pt denies new neurological symptoms such as new headache, or facial or extremity weakness or numbness.   Pt states overall good compliance with meds, mostly trying to follow appropriate diet, with wt overall stable,  but little exercise however.  started the simvastatin about 2 wks ago, no side effects.  Plans to only take the simvastatin when she feels the the diet is not as good, and declines asa 81 mg.  Denies worsening reflux, abd pain, dysphagia, n/v, bowel change or blood.   Tranxene working well prn for nerves. Does have several wks ongoing nasal allergy symptoms with clearish congestion, itch and sneezing, without fever, pain, ST, cough, swelling or wheezing. Current Outpatient Prescriptions on File Prior to Visit  Medication Sig Dispense Refill  . bifidobacterium infantis (ALIGN) capsule Take 1 capsule by mouth daily.    . bimatoprost (LUMIGAN) 0.01 % SOLN Place 1 drop into both eyes at bedtime.    . brimonidine-timolol (COMBIGAN) 0.2-0.5 % ophthalmic solution Place 1 drop into both eyes 2 (two) times daily.    . brinzolamide (AZOPT) 1 % ophthalmic suspension Place 1 drop into both eyes 2 (two) times daily.    . clorazepate (TRANXENE) 7.5 MG tablet Take 1 tablet (7.5 mg total) by mouth 2 (two) times daily as needed for anxiety. 60 tablet 2  . Coenzyme Q10 (COQ10) 100 MG CAPS Take 100 mg by mouth daily.     . Garlic 123XX123 MG TABS Take 100 mg by mouth daily.    . Magnesium 500 MG TABS Take 1 tablet by mouth daily.    . pantoprazole (PROTONIX) 40 MG tablet Take 1 tablet (40  mg total) by mouth daily. 90 tablet 1  . simvastatin (ZOCOR) 20 MG tablet Take 1 tablet (20 mg total) by mouth at bedtime. 90 tablet 3  . vitamin C (ASCORBIC ACID) 500 MG tablet Take 1,000 mg by mouth daily.     . vitamin E 400 UNIT capsule Take 400 Units by mouth daily.       No current facility-administered medications on file prior to visit.    Review of Systems . Constitutional: Negative for unusual diaphoresis or night sweats HENT: Negative for ear swelling or discharge Eyes: Negative for worsening visual haziness  Respiratory: Negative for choking and stridor.   Gastrointestinal: Negative for distension or worsening eructation Genitourinary: Negative for retention or change in urine volume.  Musculoskeletal: Negative for other MSK pain or swelling Skin: Negative for color change and worsening wound Neurological: Negative for tremors and numbness other than noted  Psychiatric/Behavioral: Negative for decreased concentration or agitation other than above       Objective:   Physical Exam BP 118/64   Pulse 62   Temp 98 F (36.7 C) (Oral)   Resp 20   Wt 149 lb (67.6 kg)   SpO2 93%   BMI 26.39 kg/m  VS noted,  Constitutional: Pt appears in no apparent distress HENT: Head: NCAT.  Right Ear: External ear normal.  Left Ear: External ear normal.  Eyes: . Pupils are  equal, round, and reactive to light. Conjunctivae and EOM are normal Bilat tm's with mild erythema.  Max sinus areas non tender.  Pharynx with mild erythema, no exudate Neck: Normal range of motion. Neck supple.  Cardiovascular: Normal rate and regular rhythm.   Pulmonary/Chest: Effort normal and breath sounds without rales or wheezing.  Abd:  Soft, NT, ND, + BS Neurological: Pt is alert. Not confused , motor grossly intact Skin: Skin is warm. No rash, no LE edema Psychiatric: Pt behavior is normal. No agitation.     Assessment & Plan:

## 2015-11-22 NOTE — Progress Notes (Signed)
Pre visit review using our clinic review tool, if applicable. No additional management support is needed unless otherwise documented below in the visit note. 

## 2015-11-24 NOTE — Assessment & Plan Note (Addendum)
stable overall by history and exam, recent data reviewed with pt, and pt to continue medical treatment as before,  to f/u any worsening symptoms or concerns Lab Results  Component Value Date   WBC 8.1 10/25/2015   HGB 11.1 (L) 10/25/2015   HCT 32.7 (L) 10/25/2015   PLT 240.0 10/25/2015   GLUCOSE 95 10/25/2015   CHOL 240 (H) 10/25/2015   TRIG 206.0 (H) 10/25/2015   HDL 50.30 10/25/2015   LDLDIRECT 152.0 10/25/2015   LDLCALC 97 11/15/2014   ALT 9 10/25/2015   AST 16 10/25/2015   NA 134 (L) 10/25/2015   K 4.3 10/25/2015   CL 102 10/25/2015   CREATININE 0.93 10/25/2015   BUN 10 10/25/2015   CO2 27 10/25/2015   TSH 1.14 10/25/2015   In addition to the time spent performing CPE, I spent an additional 25 minutes face to face,in which greater than 50% of this time was spent in counseling and coordination of care for patient's acute illness as documented.

## 2015-11-24 NOTE — Assessment & Plan Note (Signed)
D/w pt, for otc zyrtec/nasacort asd, to f/u any worsening symptoms or concerns

## 2015-11-24 NOTE — Assessment & Plan Note (Signed)
stable overall by history and exam, recent data reviewed with pt, and pt to continue medical treatment as before,  to f/u any worsening symptoms or concerns Lab Results  Component Value Date   LDLCALC 97 11/15/2014   For f/u lab

## 2015-11-24 NOTE — Assessment & Plan Note (Signed)
stable overall by history and exam, and pt to continue medical treatment as before,  to f/u any worsening symptoms or concerns 

## 2015-11-24 NOTE — Assessment & Plan Note (Signed)

## 2016-01-07 DIAGNOSIS — H2511 Age-related nuclear cataract, right eye: Secondary | ICD-10-CM | POA: Diagnosis not present

## 2016-01-07 DIAGNOSIS — H2513 Age-related nuclear cataract, bilateral: Secondary | ICD-10-CM | POA: Diagnosis not present

## 2016-01-08 DIAGNOSIS — H3582 Retinal ischemia: Secondary | ICD-10-CM | POA: Diagnosis not present

## 2016-01-08 DIAGNOSIS — H43813 Vitreous degeneration, bilateral: Secondary | ICD-10-CM | POA: Diagnosis not present

## 2016-01-08 DIAGNOSIS — H34811 Central retinal vein occlusion, right eye, with macular edema: Secondary | ICD-10-CM | POA: Diagnosis not present

## 2016-01-15 DIAGNOSIS — H2511 Age-related nuclear cataract, right eye: Secondary | ICD-10-CM | POA: Diagnosis not present

## 2016-01-16 DIAGNOSIS — H2512 Age-related nuclear cataract, left eye: Secondary | ICD-10-CM | POA: Diagnosis not present

## 2016-01-27 ENCOUNTER — Other Ambulatory Visit: Payer: Self-pay | Admitting: Internal Medicine

## 2016-02-08 ENCOUNTER — Telehealth: Payer: Self-pay | Admitting: Internal Medicine

## 2016-02-08 NOTE — Telephone Encounter (Signed)
PLEASE NOTE: All timestamps contained within this report are represented as [place] Standard Time. CONFIDENTIALTY NOTICE: This fax transmission is intended only for the addressee. It contains information that is legally privileged, confidential or otherwise protected from use or disclosure. If you are not the intended recipient, you are strictly prohibited from reviewing, disclosing, copying using or disseminating any of this information or taking any action in reliance on or regarding this information. If you have received this fax in error, please notify us immediately by telephone so that we can arrange for its return to [place]. Phone: [phone], Toll-Free: [phone], Fax: [phone] Page: 1 of 1 Call Id: [id number] [place] [facility] - Client [name] Patient Name: [name] DOB: 10/14/1934 Initial Comment Caller's sinus' are draining, is getting an earache on left side. Nurse Assessment Guidelines Guideline Title Affirmed Question Affirmed Notes Final Disposition User FINAL ATTEMPT MADE - no message left Dimas Chyle, Therapist, sports, Dellis Filbert

## 2016-02-13 ENCOUNTER — Encounter: Payer: Self-pay | Admitting: Internal Medicine

## 2016-02-13 ENCOUNTER — Ambulatory Visit (INDEPENDENT_AMBULATORY_CARE_PROVIDER_SITE_OTHER): Payer: Medicare PPO | Admitting: Internal Medicine

## 2016-02-13 VITALS — BP 128/80 | HR 72 | Temp 98.2°F | Resp 20 | Wt 149.0 lb

## 2016-02-13 DIAGNOSIS — F411 Generalized anxiety disorder: Secondary | ICD-10-CM

## 2016-02-13 DIAGNOSIS — J019 Acute sinusitis, unspecified: Secondary | ICD-10-CM

## 2016-02-13 DIAGNOSIS — Z23 Encounter for immunization: Secondary | ICD-10-CM | POA: Diagnosis not present

## 2016-02-13 DIAGNOSIS — T7840XS Allergy, unspecified, sequela: Secondary | ICD-10-CM

## 2016-02-13 MED ORDER — AZITHROMYCIN 250 MG PO TABS: ORAL_TABLET | ORAL | 1 refills | Status: DC

## 2016-02-13 NOTE — Progress Notes (Signed)
Subjective:    Patient ID: Hannah Madden, female    DOB: 1934/11/28, 81 y.o.   MRN: [id number]  HPI   Here with 2-3 days acute onset fever, facial pain, pressure, headache, general weakness and malaise, and greenish d/c, with mild ST and cough, but pt denies chest pain, wheezing, increased sob or doe, orthopnea, PND, increased LE swelling, palpitations, dizziness or syncope. Also, Does have several wks ongoing nasal allergy symptoms with clearish congestion, itch and sneezing, without fever, pain, ST, cough, swelling or wheezing. Has tried claritin D but not working well with significant congestion.  No other new history  Denies worsening depressive symptoms, suicidal ideation, or panic; has ongoing anxiety, not increased recently.  Past Medical History:  Diagnosis Date  . Alopecia    stress related and resolved since her Mother moved to SNF  . GERD (gastroesophageal reflux disease)    takes Nexium daily  . Glaucoma    uses eye drops daily  . H/O hiatal hernia   . Hypercholesteremia    takes Simvastatin daily  . Pneumonia 1960   Past Surgical History:  Procedure Laterality Date  . ABDOMINAL HYSTERECTOMY     fibroids.  . COLONOSCOPY    . KNEE ARTHROSCOPY WITH MEDIAL MENISECTOMY Left 09/12/2013   Procedure: KNEE ARTHROSCOPY WITH DEBRIDEMENT;  Surgeon: Meredith Pel, MD;  Location: [place];  Service: Orthopedics;  Laterality: Left;    reports that she has never smoked. She has never used smokeless tobacco. She reports that she does not drink alcohol or use drugs. family history is not on file. Allergies  Allergen Reactions  . Atorvastatin     REACTION: INTOL to Lipitor w/ leg pain  . Penicillins     REACTION: hives  . Protonix [Pantoprazole Sodium] Other (See Comments)    diarrhea   Current Outpatient Prescriptions on File Prior to Visit  Medication Sig Dispense Refill  . bifidobacterium infantis (ALIGN) capsule Take 1 capsule by mouth daily.    . bimatoprost (LUMIGAN)  0.01 % SOLN Place 1 drop into both eyes at bedtime.    . brimonidine-timolol (COMBIGAN) 0.2-0.5 % ophthalmic solution Place 1 drop into both eyes 2 (two) times daily.    . brinzolamide (AZOPT) 1 % ophthalmic suspension Place 1 drop into both eyes 2 (two) times daily.    . clorazepate (TRANXENE) 7.5 MG tablet Take 1 tablet (7.5 mg total) by mouth 2 (two) times daily as needed for anxiety. 60 tablet 2  . Coenzyme Q10 (COQ10) 100 MG CAPS Take 100 mg by mouth daily.     . Garlic 123XX123 MG TABS Take 100 mg by mouth daily.    . Magnesium 500 MG TABS Take 1 tablet by mouth daily.    . pantoprazole (PROTONIX) 40 MG tablet TAKE 1 TABLET DAILY 90 tablet 1  . simvastatin (ZOCOR) 20 MG tablet Take 1 tablet (20 mg total) by mouth at bedtime. 90 tablet 3  . vitamin C (ASCORBIC ACID) 500 MG tablet Take 1,000 mg by mouth daily.     . vitamin E 400 UNIT capsule Take 400 Units by mouth daily.       No current facility-administered medications on file prior to visit.    Review of Systems  Constitutional: Negative for unusual diaphoresis or night sweats HENT: Negative for ear swelling or discharge Eyes: Negative for worsening visual haziness  Respiratory: Negative for choking and stridor.   Gastrointestinal: Negative for distension or worsening eructation Genitourinary: Negative for retention or change  in urine volume.  Musculoskeletal: Negative for other MSK pain or swelling Skin: Negative for color change and worsening wound Neurological: Negative for tremors and numbness other than noted  Psychiatric/Behavioral: Negative for decreased concentration or agitation other than above   All other system neg per pt    Objective:   Physical Exam BP 128/80   Pulse 72   Temp 98.2 F (36.8 C) (Oral)   Resp 20   Wt 149 lb (67.6 kg)   SpO2 97%   BMI 26.39 kg/m   VS noted, mild ill Constitutional: Pt appears in no apparent distress HENT: Head: NCAT.  Right Ear: External ear normal.  Left Ear: External ear  normal.  Bilat tm's with mild erythema.  Max sinus areas mild tender.  Pharynx with mild erythema, no exudate Eyes: . Pupils are equal, round, and reactive to light. Conjunctivae and EOM are normal Neck: Normal range of motion. Neck supple.  Cardiovascular: Normal rate and regular rhythm.   Pulmonary/Chest: Effort normal and breath sounds without rales or wheezing.  Neurological: Pt is alert. Not confused , motor grossly intact Skin: Skin is warm. No rash, no LE edema Psychiatric: Pt behavior is normal. No agitation.  No other new exam findings    Assessment & Plan:

## 2016-02-13 NOTE — Progress Notes (Signed)
Pre visit review using our clinic review tool, if applicable. No additional management support is needed unless otherwise documented below in the visit note. 

## 2016-02-13 NOTE — Patient Instructions (Addendum)
Please take all new medication as prescribed - the antibiotic  OK to take OTC zyrtec and nasacort instead of the claritin D  Please continue all other medications as before, and refills have been done if requested.  Please have the pharmacy call with any other refills you may need.  Please keep your appointments with your specialists as you may have planned

## 2016-02-14 DIAGNOSIS — J019 Acute sinusitis, unspecified: Secondary | ICD-10-CM | POA: Diagnosis not present

## 2016-02-14 DIAGNOSIS — T7840XS Allergy, unspecified, sequela: Secondary | ICD-10-CM | POA: Diagnosis not present

## 2016-02-14 DIAGNOSIS — Z23 Encounter for immunization: Secondary | ICD-10-CM | POA: Diagnosis not present

## 2016-02-14 DIAGNOSIS — F411 Generalized anxiety disorder: Secondary | ICD-10-CM | POA: Diagnosis not present

## 2016-02-17 NOTE — Assessment & Plan Note (Signed)
[place] for change claritin D to zyrtec and nasacort oTC prn,  to f/u any worsening symptoms or concerns

## 2016-02-17 NOTE — Assessment & Plan Note (Signed)
stable overall by history and exam, recent data reviewed with pt, and pt to continue medical treatment as before - tranxene prn,  to f/u any worsening symptoms or concerns Lab Results  Component Value Date   WBC 8.1 10/25/2015   HGB 11.1 (L) 10/25/2015   HCT 32.7 (L) 10/25/2015   PLT 240.0 10/25/2015   GLUCOSE 95 10/25/2015   CHOL 240 (H) 10/25/2015   TRIG 206.0 (H) 10/25/2015   HDL 50.30 10/25/2015   LDLDIRECT 152.0 10/25/2015   LDLCALC 97 11/15/2014   ALT 9 10/25/2015   AST 16 10/25/2015   NA 134 (L) 10/25/2015   K 4.3 10/25/2015   CL 102 10/25/2015   CREATININE 0.93 10/25/2015   BUN 10 10/25/2015   CO2 27 10/25/2015   TSH 1.14 10/25/2015

## 2016-02-17 NOTE — Assessment & Plan Note (Signed)
Mild to mod, for antibx course,  to f/u any worsening symptoms or concerns 

## 2016-02-20 DIAGNOSIS — D229 Melanocytic nevi, unspecified: Secondary | ICD-10-CM | POA: Diagnosis not present

## 2016-02-20 DIAGNOSIS — L821 Other seborrheic keratosis: Secondary | ICD-10-CM | POA: Diagnosis not present

## 2016-03-05 DIAGNOSIS — H402223 Chronic angle-closure glaucoma, left eye, severe stage: Secondary | ICD-10-CM | POA: Diagnosis not present

## 2016-03-05 DIAGNOSIS — H2512 Age-related nuclear cataract, left eye: Secondary | ICD-10-CM | POA: Diagnosis not present

## 2016-03-18 DIAGNOSIS — H3582 Retinal ischemia: Secondary | ICD-10-CM | POA: Diagnosis not present

## 2016-03-18 DIAGNOSIS — H43813 Vitreous degeneration, bilateral: Secondary | ICD-10-CM | POA: Diagnosis not present

## 2016-03-18 DIAGNOSIS — H34811 Central retinal vein occlusion, right eye, with macular edema: Secondary | ICD-10-CM | POA: Diagnosis not present

## 2016-04-02 DIAGNOSIS — H20011 Primary iridocyclitis, right eye: Secondary | ICD-10-CM | POA: Diagnosis not present

## 2016-04-11 DIAGNOSIS — H2512 Age-related nuclear cataract, left eye: Secondary | ICD-10-CM | POA: Diagnosis not present

## 2016-04-11 DIAGNOSIS — H402220 Chronic angle-closure glaucoma, left eye, stage unspecified: Secondary | ICD-10-CM | POA: Diagnosis not present

## 2016-04-11 DIAGNOSIS — H402222 Chronic angle-closure glaucoma, left eye, moderate stage: Secondary | ICD-10-CM | POA: Diagnosis not present

## 2016-05-15 DIAGNOSIS — H20013 Primary iridocyclitis, bilateral: Secondary | ICD-10-CM | POA: Diagnosis not present

## 2016-05-20 DIAGNOSIS — H34811 Central retinal vein occlusion, right eye, with macular edema: Secondary | ICD-10-CM | POA: Diagnosis not present

## 2016-05-20 DIAGNOSIS — H43813 Vitreous degeneration, bilateral: Secondary | ICD-10-CM | POA: Diagnosis not present

## 2016-05-20 DIAGNOSIS — H3582 Retinal ischemia: Secondary | ICD-10-CM | POA: Diagnosis not present

## 2016-05-20 DIAGNOSIS — H35371 Puckering of macula, right eye: Secondary | ICD-10-CM | POA: Diagnosis not present

## 2016-06-10 DIAGNOSIS — H20023 Recurrent acute iridocyclitis, bilateral: Secondary | ICD-10-CM | POA: Diagnosis not present

## 2016-06-17 DIAGNOSIS — H20023 Recurrent acute iridocyclitis, bilateral: Secondary | ICD-10-CM | POA: Diagnosis not present

## 2016-07-23 DIAGNOSIS — H43813 Vitreous degeneration, bilateral: Secondary | ICD-10-CM | POA: Diagnosis not present

## 2016-07-23 DIAGNOSIS — H34811 Central retinal vein occlusion, right eye, with macular edema: Secondary | ICD-10-CM | POA: Diagnosis not present

## 2016-07-23 DIAGNOSIS — H3582 Retinal ischemia: Secondary | ICD-10-CM | POA: Diagnosis not present

## 2016-07-23 DIAGNOSIS — H35371 Puckering of macula, right eye: Secondary | ICD-10-CM | POA: Diagnosis not present

## 2016-07-29 DIAGNOSIS — H20021 Recurrent acute iridocyclitis, right eye: Secondary | ICD-10-CM | POA: Diagnosis not present

## 2016-08-04 DIAGNOSIS — Z9841 Cataract extraction status, right eye: Secondary | ICD-10-CM | POA: Diagnosis not present

## 2016-08-04 DIAGNOSIS — H35371 Puckering of macula, right eye: Secondary | ICD-10-CM | POA: Diagnosis not present

## 2016-08-04 DIAGNOSIS — H34831 Tributary (branch) retinal vein occlusion, right eye, with macular edema: Secondary | ICD-10-CM | POA: Diagnosis not present

## 2016-08-04 DIAGNOSIS — H59031 Cystoid macular edema following cataract surgery, right eye: Secondary | ICD-10-CM | POA: Diagnosis not present

## 2016-08-04 DIAGNOSIS — H402223 Chronic angle-closure glaucoma, left eye, severe stage: Secondary | ICD-10-CM | POA: Diagnosis not present

## 2016-08-24 IMAGING — DX DG LUMBAR SPINE 2-3V
3 series · 3 of 3 positions shown · non-contrast
Comparison: None.

CLINICAL DATA: Right leg pain starting yesterday extending from the
buttocks to the ankle. Tonight extends from the ankle up the leg.
Cramping pain.

EXAM:
LUMBAR SPINE - 2-3 VIEW

[l-spine ap]
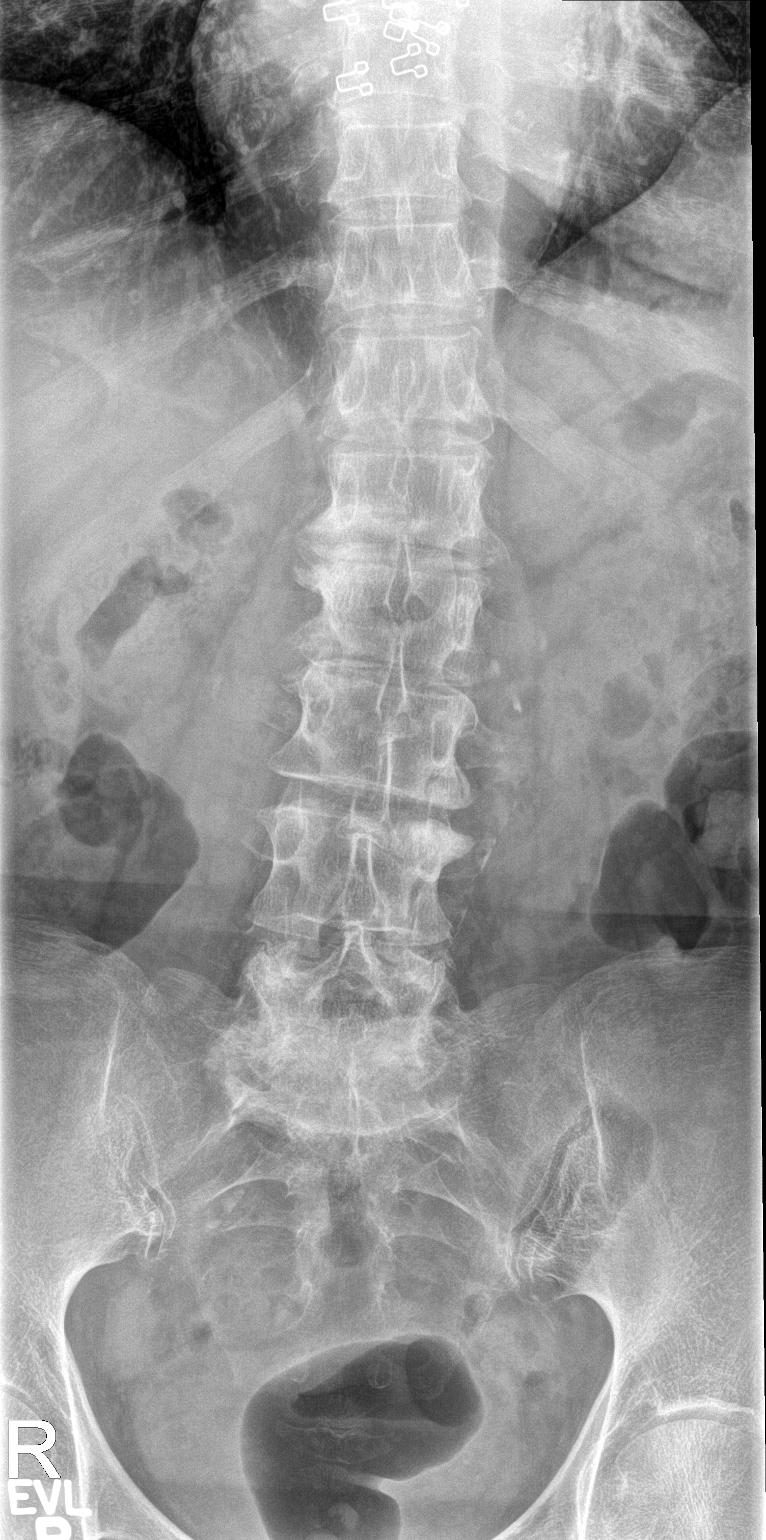

[l-spine lat]
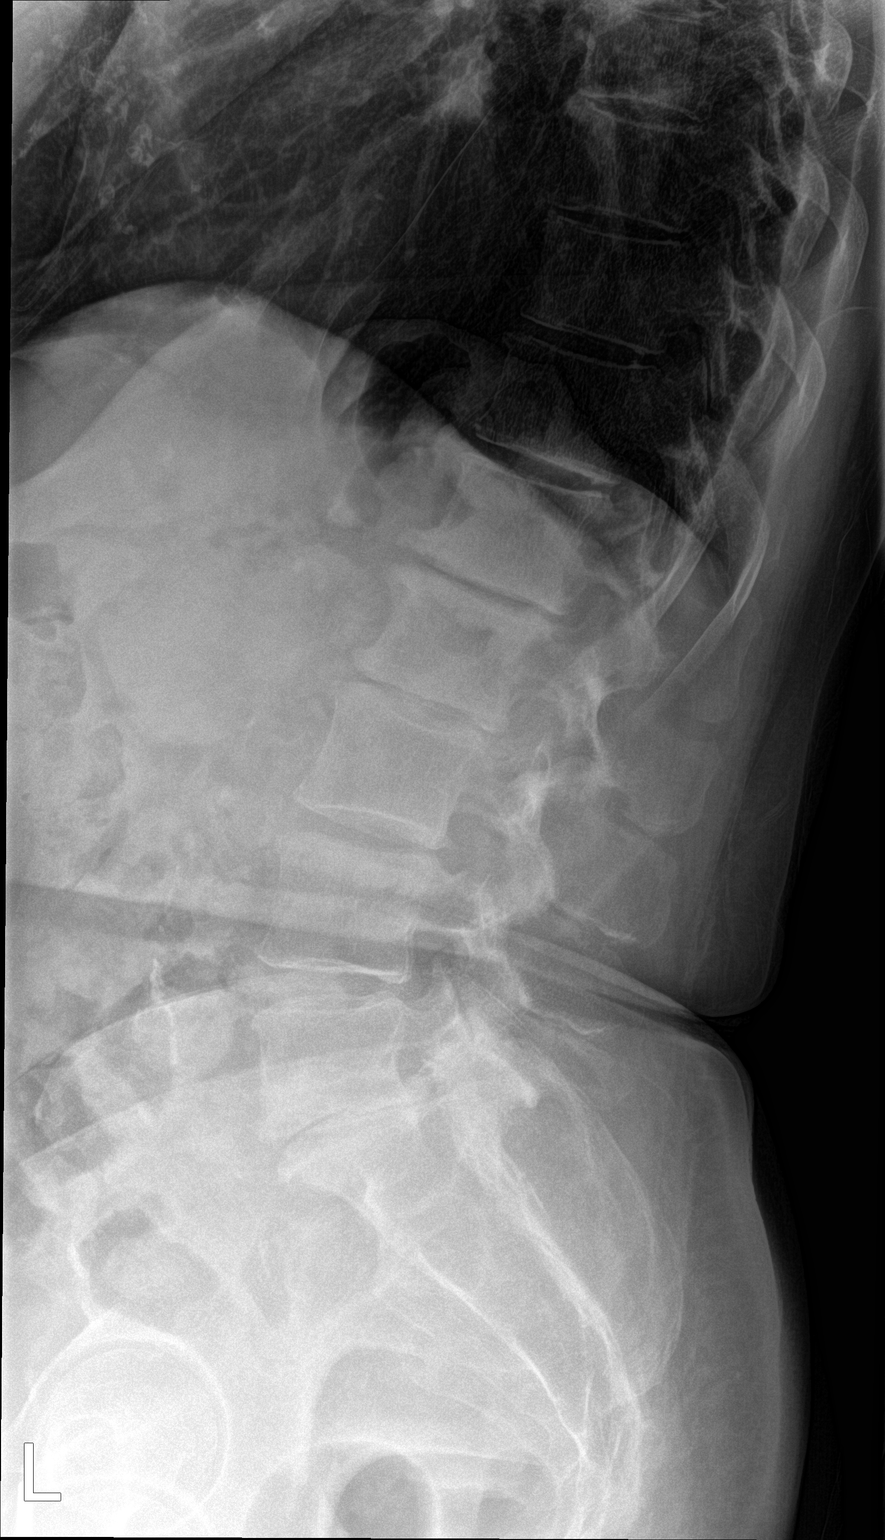

[l-spine spot]
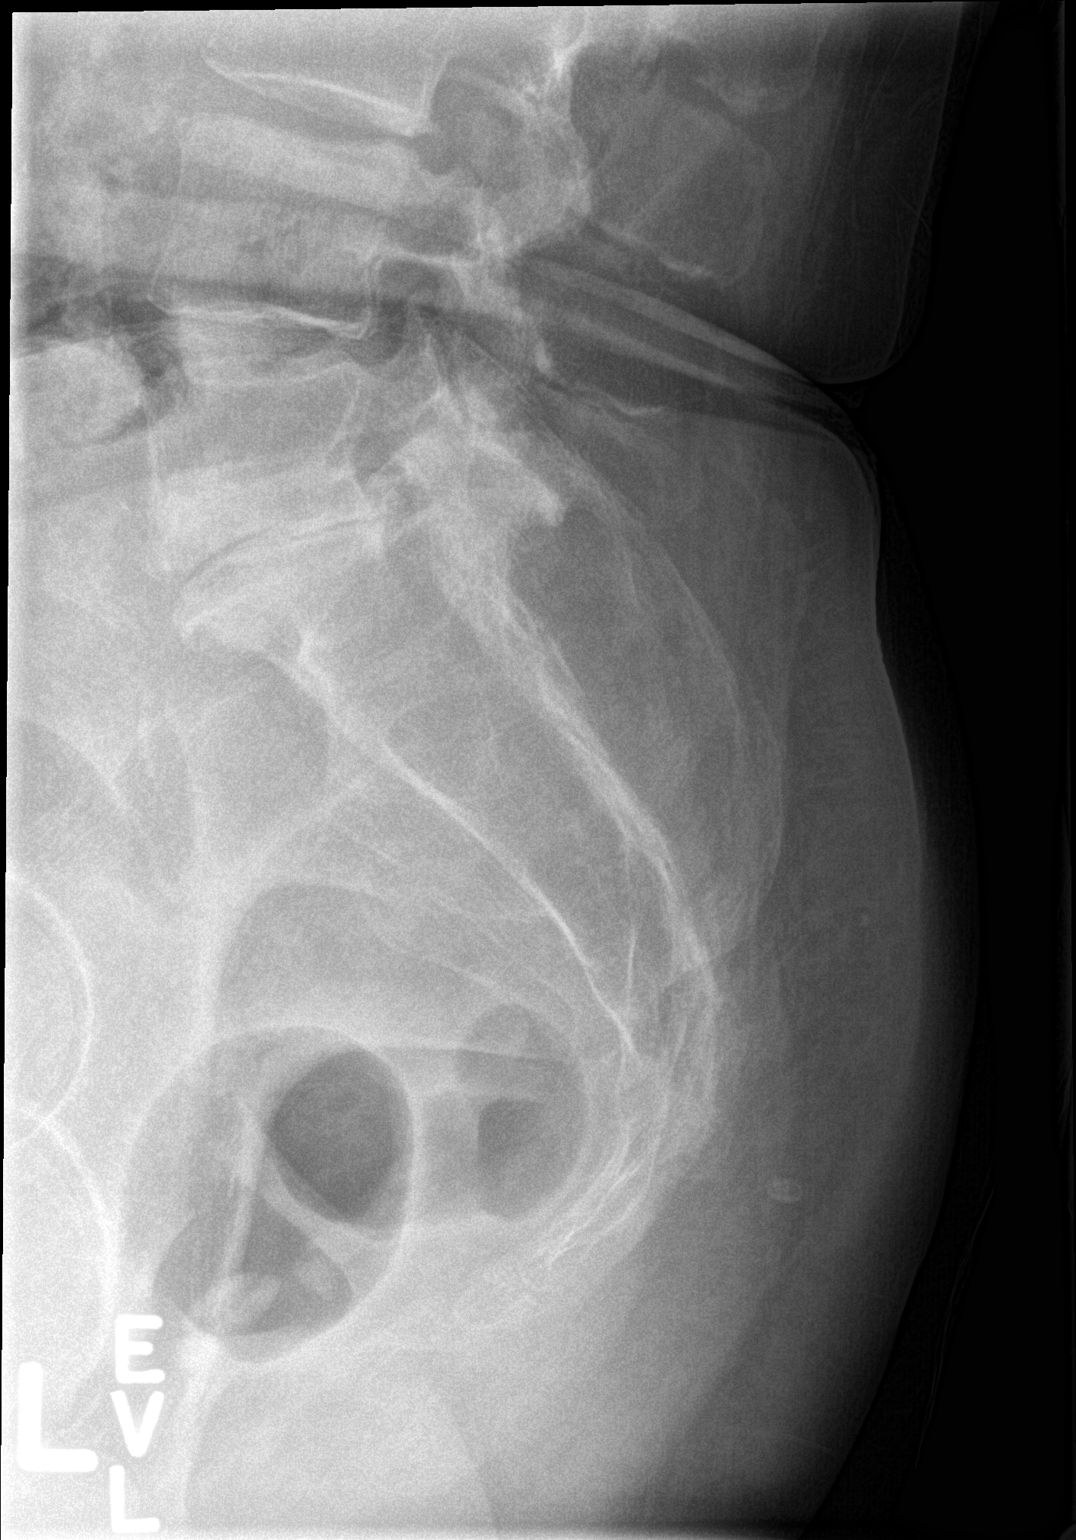

[3 of 3 positions shown; findings below may reference images not displayed]

FINDINGS: Normal alignment of the lumbar spine. Degenerative changes
throughout with narrowed lumbar interspaces and associated endplate
hypertrophic changes. No vertebral compression deformities. No focal
bone lesion or bone destruction. Vascular calcifications.
IMPRESSION: Degenerative changes. Normal line. No acute displaced fractures
identified

## 2016-08-26 ENCOUNTER — Other Ambulatory Visit: Payer: Self-pay | Admitting: Internal Medicine

## 2016-08-26 ENCOUNTER — Telehealth: Payer: Self-pay | Admitting: Internal Medicine

## 2016-08-26 MED ORDER — PANTOPRAZOLE SODIUM 40 MG PO TBEC: 40.0000 mg | DELAYED_RELEASE_TABLET | Freq: Every day | ORAL | 0 refills | Status: DC

## 2016-08-26 NOTE — Telephone Encounter (Signed)
Done

## 2016-08-26 NOTE — Addendum Note (Signed)
Addended by: Juliet Rude on: 08/26/2016 11:09 AM   Modules accepted: Orders

## 2016-08-26 NOTE — Telephone Encounter (Signed)
This prescription was sent to her mail order pharmacy but she is very low and is asking for a small supply to be sent to [facility] on [address] to get her through until her mail order prescription arrives.

## 2016-09-08 DIAGNOSIS — H34831 Tributary (branch) retinal vein occlusion, right eye, with macular edema: Secondary | ICD-10-CM | POA: Diagnosis not present

## 2016-09-17 DIAGNOSIS — H402223 Chronic angle-closure glaucoma, left eye, severe stage: Secondary | ICD-10-CM | POA: Diagnosis not present

## 2016-10-01 DIAGNOSIS — N8110 Cystocele, unspecified: Secondary | ICD-10-CM | POA: Diagnosis not present

## 2016-10-01 DIAGNOSIS — Z6825 Body mass index (BMI) 25.0-25.9, adult: Secondary | ICD-10-CM | POA: Diagnosis not present

## 2016-10-15 DIAGNOSIS — H402223 Chronic angle-closure glaucoma, left eye, severe stage: Secondary | ICD-10-CM | POA: Diagnosis not present

## 2016-10-20 DIAGNOSIS — Z961 Presence of intraocular lens: Secondary | ICD-10-CM | POA: Diagnosis not present

## 2016-10-20 DIAGNOSIS — H34831 Tributary (branch) retinal vein occlusion, right eye, with macular edema: Secondary | ICD-10-CM | POA: Diagnosis not present

## 2016-10-20 DIAGNOSIS — H35371 Puckering of macula, right eye: Secondary | ICD-10-CM | POA: Diagnosis not present

## 2016-10-20 DIAGNOSIS — H4089 Other specified glaucoma: Secondary | ICD-10-CM | POA: Diagnosis not present

## 2016-10-20 DIAGNOSIS — H209 Unspecified iridocyclitis: Secondary | ICD-10-CM | POA: Diagnosis not present

## 2016-11-04 ENCOUNTER — Other Ambulatory Visit: Payer: Self-pay | Admitting: Internal Medicine

## 2016-11-04 DIAGNOSIS — Z1231 Encounter for screening mammogram for malignant neoplasm of breast: Secondary | ICD-10-CM

## 2016-11-06 ENCOUNTER — Other Ambulatory Visit: Payer: Self-pay | Admitting: Internal Medicine

## 2016-11-12 DIAGNOSIS — H40033 Anatomical narrow angle, bilateral: Secondary | ICD-10-CM | POA: Diagnosis not present

## 2016-11-18 ENCOUNTER — Ambulatory Visit
Admission: RE | Admit: 2016-11-18 | Discharge: 2016-11-18 | Disposition: A | Discharge status: Home / Self Care | Payer: Medicare PPO | Source: Ambulatory Visit | Attending: Internal Medicine | Admitting: Internal Medicine

## 2016-11-18 DIAGNOSIS — Z1231 Encounter for screening mammogram for malignant neoplasm of breast: Secondary | ICD-10-CM

## 2016-12-01 DIAGNOSIS — H34831 Tributary (branch) retinal vein occlusion, right eye, with macular edema: Secondary | ICD-10-CM | POA: Diagnosis not present

## 2016-12-03 ENCOUNTER — Ambulatory Visit (INDEPENDENT_AMBULATORY_CARE_PROVIDER_SITE_OTHER): Payer: Medicare PPO | Admitting: Internal Medicine

## 2016-12-03 ENCOUNTER — Other Ambulatory Visit (INDEPENDENT_AMBULATORY_CARE_PROVIDER_SITE_OTHER): Payer: Medicare PPO

## 2016-12-03 ENCOUNTER — Encounter: Payer: Self-pay | Admitting: Internal Medicine

## 2016-12-03 VITALS — BP 114/70 | HR 73 | Temp 98.0°F | Ht 63.0 in | Wt 148.0 lb

## 2016-12-03 DIAGNOSIS — Z0000 Encounter for general adult medical examination without abnormal findings: Secondary | ICD-10-CM

## 2016-12-03 LAB — HEPATIC FUNCTION PANEL
ALT: 9 U/L (ref 0–35)
AST: 15 U/L (ref 0–37)
Albumin: 4.1 g/dL (ref 3.5–5.2)
Alkaline Phosphatase: 80 U/L (ref 39–117)
BILIRUBIN DIRECT: 0.1 mg/dL (ref 0.0–0.3)
BILIRUBIN TOTAL: 0.4 mg/dL (ref 0.2–1.2)
Total Protein: 6.8 g/dL (ref 6.0–8.3)

## 2016-12-03 LAB — CBC WITH DIFFERENTIAL/PLATELET
BASOS PCT: 0.4 % (ref 0.0–3.0)
Basophils Absolute: 0 10*3/uL (ref 0.0–0.1)
EOS ABS: 0.2 10*3/uL (ref 0.0–0.7)
Eosinophils Relative: 3.4 % (ref 0.0–5.0)
HCT: 34.1 % — ABNORMAL LOW (ref 36.0–46.0)
Hemoglobin: 11.3 g/dL — ABNORMAL LOW (ref 12.0–15.0)
LYMPHS ABS: 2.9 10*3/uL (ref 0.7–4.0)
Lymphocytes Relative: 40.2 % (ref 12.0–46.0)
MCHC: 33 g/dL (ref 30.0–36.0)
MCV: 88.7 fl (ref 78.0–100.0)
MONO ABS: 0.7 10*3/uL (ref 0.1–1.0)
Monocytes Relative: 9.2 % (ref 3.0–12.0)
NEUTROS ABS: 3.3 10*3/uL (ref 1.4–7.7)
NEUTROS PCT: 46.8 % (ref 43.0–77.0)
PLATELETS: 235 10*3/uL (ref 150.0–400.0)
RBC: 3.85 Mil/uL — ABNORMAL LOW (ref 3.87–5.11)
RDW: 13.5 % (ref 11.5–15.5)
WBC: 7.1 10*3/uL (ref 4.0–10.5)

## 2016-12-03 LAB — LIPID PANEL
CHOL/HDL RATIO: 3
Cholesterol: 164 mg/dL (ref 0–200)
HDL: 50.8 mg/dL (ref >39.00)
LDL Cholesterol: 99 mg/dL (ref 0–99)
NonHDL: 113.48
TRIGLYCERIDES: 71 mg/dL (ref 0.0–149.0)
VLDL: 14.2 mg/dL (ref 0.0–40.0)

## 2016-12-03 LAB — BASIC METABOLIC PANEL
BUN: 11 mg/dL (ref 6–23)
CHLORIDE: 106 meq/L (ref 96–112)
CO2: 25 mEq/L (ref 19–32)
Calcium: 9.2 mg/dL (ref 8.4–10.5)
Creatinine, Ser: 1.05 mg/dL (ref 0.40–1.20)
GFR: 64.86 mL/min (ref >60.00)
Glucose, Bld: 105 mg/dL — ABNORMAL HIGH (ref 70–99)
POTASSIUM: 4.4 meq/L (ref 3.5–5.1)
Sodium: 139 mEq/L (ref 135–145)

## 2016-12-03 LAB — URINALYSIS, ROUTINE W REFLEX MICROSCOPIC
Bilirubin Urine: NEGATIVE
Ketones, ur: NEGATIVE
Nitrite: NEGATIVE
SPECIFIC GRAVITY, URINE: 1.01 (ref 1.000–1.030)
TOTAL PROTEIN, URINE-UPE24: NEGATIVE
URINE GLUCOSE: NEGATIVE
Urobilinogen, UA: 0.2 (ref 0.0–1.0)
pH: 6 (ref 5.0–8.0)

## 2016-12-03 LAB — TSH: TSH: 1.34 u[IU]/mL (ref 0.35–4.50)

## 2016-12-03 MED ORDER — PANTOPRAZOLE SODIUM 40 MG PO TBEC: 40.0000 mg | DELAYED_RELEASE_TABLET | Freq: Every day | ORAL | 3 refills | Status: DC

## 2016-12-03 MED ORDER — CLORAZEPATE DIPOTASSIUM 7.5 MG PO TABS: 7.5000 mg | ORAL_TABLET | Freq: Two times a day (BID) | ORAL | 2 refills | Status: DC | PRN

## 2016-12-03 NOTE — Assessment & Plan Note (Signed)

## 2016-12-03 NOTE — Patient Instructions (Signed)

## 2016-12-03 NOTE — Progress Notes (Signed)
Subjective:    Patient ID: Hannah Madden, female    DOB: Aug 31, 1934, 82 y.o.   MRN: [id number]  HPI  Here for wellness and f/u;  Overall doing ok;  Pt denies Chest pain, worsening SOB, DOE, wheezing, orthopnea, PND, worsening LE edema, palpitations, dizziness or syncope.  Pt denies neurological change such as new headache, facial or extremity weakness.  Pt denies polydipsia, polyuria, or low sugar symptoms. Pt states overall good compliance with treatment and medications, good tolerability, and has been trying to follow appropriate diet.  Pt denies worsening depressive symptoms, suicidal ideation or panic. No fever, night sweats, wt loss, loss of appetite, or other constitutional symptoms.  Pt states good ability with ADL's, has low fall risk, home safety reviewed and adequate, no other significant changes in hearing or vision, and only occasionally active with exercise. No other interval hx. Tranxene working well qhs prn sleep.  BP Readings from Last 3 Encounters:  12/03/16 114/70  02/13/16 128/80  11/22/15 118/64   Wt Readings from Last 3 Encounters:  12/03/16 148 lb (67.1 kg)  02/13/16 149 lb (67.6 kg)  11/22/15 149 lb (67.6 kg)  Wants to wait on flu and prevnar for now. Declines DXA.  Does not want statin or even asa 81 mg preventive. Past Medical History:  Diagnosis Date  . Alopecia    stress related and resolved since her Mother moved to SNF  . GERD (gastroesophageal reflux disease)    takes Nexium daily  . Glaucoma    uses eye drops daily  . H/O hiatal hernia   . Hypercholesteremia    takes Simvastatin daily  . Pneumonia 1960   Past Surgical History:  Procedure Laterality Date  . ABDOMINAL HYSTERECTOMY     fibroids.  . COLONOSCOPY    . KNEE ARTHROSCOPY WITH MEDIAL MENISECTOMY Left 09/12/2013   Procedure: KNEE ARTHROSCOPY WITH DEBRIDEMENT;  Surgeon: Meredith Pel, MD;  Location: [place];  Service: Orthopedics;  Laterality: Left;    reports that she has never smoked.  She has never used smokeless tobacco. She reports that she does not drink alcohol or use drugs. family history is not on file. Allergies  Allergen Reactions  . Atorvastatin     REACTION: INTOL to Lipitor w/ leg pain  . Penicillins     REACTION: hives  . Protonix [Pantoprazole Sodium] Other (See Comments)    diarrhea   Current Outpatient Prescriptions on File Prior to Visit  Medication Sig Dispense Refill  . bifidobacterium infantis (ALIGN) capsule Take 1 capsule by mouth daily.    . brimonidine-timolol (COMBIGAN) 0.2-0.5 % ophthalmic solution Place 1 drop into both eyes 2 (two) times daily.    . brinzolamide (AZOPT) 1 % ophthalmic suspension Place 1 drop into both eyes 2 (two) times daily.    . Coenzyme Q10 (COQ10) 100 MG CAPS Take 100 mg by mouth daily.     . Garlic 875 MG TABS Take 100 mg by mouth daily.    . Magnesium 500 MG TABS Take 1 tablet by mouth daily.    . vitamin C (ASCORBIC ACID) 500 MG tablet Take 1,000 mg by mouth daily.     . vitamin E 400 UNIT capsule Take 400 Units by mouth daily.       No current facility-administered medications on file prior to visit.    Review of Systems Constitutional: Negative for other unusual diaphoresis, sweats, appetite or weight changes HENT: Negative for other worsening hearing loss, ear pain, facial swelling, mouth  sores or neck stiffness.   Eyes: Negative for other worsening pain, redness or other visual disturbance.  Respiratory: Negative for other stridor or swelling Cardiovascular: Negative for other palpitations or other chest pain  Gastrointestinal: Negative for worsening diarrhea or loose stools, blood in stool, distention or other pain Genitourinary: Negative for hematuria, flank pain or other change in urine volume.  Musculoskeletal: Negative for myalgias or other joint swelling.  Skin: Negative for other color change, or other wound or worsening drainage.  Neurological: Negative for other syncope or numbness. Hematological:  Negative for other adenopathy or swelling Psychiatric/Behavioral: Negative for hallucinations, other worsening agitation, SI, self-injury, or new decreased concentration All other system neg per pt    Objective:   Physical Exam BP 114/70   Pulse 73   Temp 98 F (36.7 C) (Oral)   Ht 5\' 3"  (1.6 m)   Wt 148 lb (67.1 kg)   SpO2 98%   BMI 26.22 kg/m  VS noted,  Constitutional: Pt is oriented to person, place, and time. Appears well-developed and well-nourished, in no significant distress and comfortable Head: Normocephalic and atraumatic  Eyes: Conjunctivae and EOM are normal. Pupils are equal, round, and reactive to light Right Ear: External ear normal without discharge Left Ear: External ear normal without discharge Nose: Nose without discharge or deformity Mouth/Throat: Oropharynx is without other ulcerations and moist  Neck: Normal range of motion. Neck supple. No JVD present. No tracheal deviation present or significant neck LA or mass Cardiovascular: Normal rate, regular rhythm, normal heart sounds and intact distal pulses.   Pulmonary/Chest: WOB normal and breath sounds without rales or wheezing  Abdominal: Soft. Bowel sounds are normal. NT. No HSM  Musculoskeletal: Normal range of motion. Exhibits no edema, has mild bilat knee bony degenerative changes Lymphadenopathy: Has no other cervical adenopathy.  Neurological: Pt is alert and oriented to person, place, and time. Pt has normal reflexes. No cranial nerve deficit. Motor grossly intact, Gait intact Skin: Skin is warm and dry. No rash noted or new ulcerations Psychiatric:  Has normal mood and affect. Behavior is normal without agitation No other exam findings Lab Results  Component Value Date   WBC 8.1 10/25/2015   HGB 11.1 (L) 10/25/2015   HCT 32.7 (L) 10/25/2015   PLT 240.0 10/25/2015   GLUCOSE 95 10/25/2015   CHOL 240 (H) 10/25/2015   TRIG 206.0 (H) 10/25/2015   HDL 50.30 10/25/2015   LDLDIRECT 152.0 10/25/2015    LDLCALC 97 11/15/2014   ALT 9 10/25/2015   AST 16 10/25/2015   NA 134 (L) 10/25/2015   K 4.3 10/25/2015   CL 102 10/25/2015   CREATININE 0.93 10/25/2015   BUN 10 10/25/2015   CO2 27 10/25/2015   TSH 1.14 10/25/2015       Assessment & Plan:

## 2016-12-09 DIAGNOSIS — H34831 Tributary (branch) retinal vein occlusion, right eye, with macular edema: Secondary | ICD-10-CM | POA: Diagnosis not present

## 2017-01-09 DIAGNOSIS — J019 Acute sinusitis, unspecified: Secondary | ICD-10-CM | POA: Diagnosis not present

## 2017-01-12 DIAGNOSIS — H401134 Primary open-angle glaucoma, bilateral, indeterminate stage: Secondary | ICD-10-CM | POA: Diagnosis not present

## 2017-01-13 DIAGNOSIS — Z961 Presence of intraocular lens: Secondary | ICD-10-CM | POA: Diagnosis not present

## 2017-01-13 DIAGNOSIS — H4010X0 Unspecified open-angle glaucoma, stage unspecified: Secondary | ICD-10-CM | POA: Diagnosis not present

## 2017-01-13 DIAGNOSIS — H35351 Cystoid macular degeneration, right eye: Secondary | ICD-10-CM | POA: Diagnosis not present

## 2017-02-11 ENCOUNTER — Telehealth: Payer: Self-pay | Admitting: Internal Medicine

## 2017-02-11 MED ORDER — DESONIDE 0.05 % EX OINT: 1.0000 "application " | TOPICAL_OINTMENT | Freq: Two times a day (BID) | CUTANEOUS | 0 refills | Status: DC

## 2017-02-11 NOTE — Telephone Encounter (Signed)
Done erx 

## 2017-02-11 NOTE — Telephone Encounter (Addendum)
Notified pt rx has been sent to your local pharmacy.../lmb  

## 2017-02-11 NOTE — Telephone Encounter (Signed)
Copied from [facility] #[id number]. Topic: Quick Communication - Rx Refill/Question >> Feb 11, 2017 11:05 AM Lennox Solders wrote: Has the patient contacted their pharmacy? no(Agent: If no, request that the patient contact the pharmacy for the refill.) Preferred Pharmacy (with phone number or street name): [email or web address] Agent: Please be advised that RX refills may take up to 3 business days. We ask that you follow-up with your pharmacy. Pt is requesting a new rx desonide-ointment 5%. Pt needs med for itching of skin

## 2017-02-11 NOTE — Telephone Encounter (Signed)
Med not on med list pls advise../lmb 

## 2017-03-10 DIAGNOSIS — H35351 Cystoid macular degeneration, right eye: Secondary | ICD-10-CM | POA: Diagnosis not present

## 2017-03-10 DIAGNOSIS — H4010X0 Unspecified open-angle glaucoma, stage unspecified: Secondary | ICD-10-CM | POA: Diagnosis not present

## 2017-03-10 DIAGNOSIS — Z961 Presence of intraocular lens: Secondary | ICD-10-CM | POA: Diagnosis not present

## 2017-04-03 DIAGNOSIS — H348312 Tributary (branch) retinal vein occlusion, right eye, stable: Secondary | ICD-10-CM | POA: Diagnosis not present

## 2017-04-03 DIAGNOSIS — H30033 Focal chorioretinal inflammation, peripheral, bilateral: Secondary | ICD-10-CM | POA: Diagnosis not present

## 2017-04-03 DIAGNOSIS — H348332 Tributary (branch) retinal vein occlusion, bilateral, stable: Secondary | ICD-10-CM | POA: Insufficient documentation

## 2017-04-03 DIAGNOSIS — H401132 Primary open-angle glaucoma, bilateral, moderate stage: Secondary | ICD-10-CM | POA: Diagnosis not present

## 2017-04-03 DIAGNOSIS — Z961 Presence of intraocular lens: Secondary | ICD-10-CM | POA: Diagnosis not present

## 2017-04-03 DIAGNOSIS — H3581 Retinal edema: Secondary | ICD-10-CM | POA: Diagnosis not present

## 2017-04-06 DIAGNOSIS — H30033 Focal chorioretinal inflammation, peripheral, bilateral: Secondary | ICD-10-CM | POA: Diagnosis not present

## 2017-05-01 DIAGNOSIS — H348332 Tributary (branch) retinal vein occlusion, bilateral, stable: Secondary | ICD-10-CM | POA: Diagnosis not present

## 2017-05-01 DIAGNOSIS — H3581 Retinal edema: Secondary | ICD-10-CM | POA: Diagnosis not present

## 2017-05-01 DIAGNOSIS — H30033 Focal chorioretinal inflammation, peripheral, bilateral: Secondary | ICD-10-CM | POA: Diagnosis not present

## 2017-05-20 DIAGNOSIS — H401132 Primary open-angle glaucoma, bilateral, moderate stage: Secondary | ICD-10-CM | POA: Diagnosis not present

## 2017-05-29 DIAGNOSIS — H3581 Retinal edema: Secondary | ICD-10-CM | POA: Diagnosis not present

## 2017-05-29 DIAGNOSIS — H348332 Tributary (branch) retinal vein occlusion, bilateral, stable: Secondary | ICD-10-CM | POA: Diagnosis not present

## 2017-07-22 DIAGNOSIS — H401132 Primary open-angle glaucoma, bilateral, moderate stage: Secondary | ICD-10-CM | POA: Diagnosis not present

## 2017-07-24 DIAGNOSIS — H34811 Central retinal vein occlusion, right eye, with macular edema: Secondary | ICD-10-CM | POA: Diagnosis not present

## 2017-07-24 DIAGNOSIS — H35371 Puckering of macula, right eye: Secondary | ICD-10-CM | POA: Diagnosis not present

## 2017-07-24 DIAGNOSIS — H43813 Vitreous degeneration, bilateral: Secondary | ICD-10-CM | POA: Diagnosis not present

## 2017-08-05 DIAGNOSIS — H34811 Central retinal vein occlusion, right eye, with macular edema: Secondary | ICD-10-CM | POA: Diagnosis not present

## 2017-08-19 ENCOUNTER — Telehealth (INDEPENDENT_AMBULATORY_CARE_PROVIDER_SITE_OTHER): Payer: Self-pay

## 2017-08-19 ENCOUNTER — Ambulatory Visit (INDEPENDENT_AMBULATORY_CARE_PROVIDER_SITE_OTHER): Payer: Medicare PPO | Admitting: Orthopedic Surgery

## 2017-08-19 ENCOUNTER — Encounter (INDEPENDENT_AMBULATORY_CARE_PROVIDER_SITE_OTHER): Payer: Self-pay | Admitting: Orthopedic Surgery

## 2017-08-19 ENCOUNTER — Ambulatory Visit (INDEPENDENT_AMBULATORY_CARE_PROVIDER_SITE_OTHER): Payer: Medicare PPO

## 2017-08-19 DIAGNOSIS — M25562 Pain in left knee: Secondary | ICD-10-CM

## 2017-08-19 DIAGNOSIS — G8929 Other chronic pain: Secondary | ICD-10-CM

## 2017-08-19 MED ORDER — LIDOCAINE HCL 1 % IJ SOLN
5.0000 mL | INTRAMUSCULAR | Status: AC | PRN
  Administered 2017-08-19: 5 mL

## 2017-08-19 MED ORDER — METHYLPREDNISOLONE ACETATE 40 MG/ML IJ SUSP
40.0000 mg | INTRAMUSCULAR | Status: AC | PRN
  Administered 2017-08-19: 40 mg via INTRA_ARTICULAR

## 2017-08-19 MED ORDER — BUPIVACAINE HCL 0.25 % IJ SOLN
4.0000 mL | INTRAMUSCULAR | Status: AC | PRN
  Administered 2017-08-19: 4 mL via INTRA_ARTICULAR

## 2017-08-19 NOTE — Telephone Encounter (Signed)
Can you please get patient pre-approved for right knee gel injection? Thanks

## 2017-08-19 NOTE — Telephone Encounter (Signed)
Noted  

## 2017-08-19 NOTE — Progress Notes (Signed)
Office Visit Note   Patient: Hannah Madden           Date of Birth: [birth year]           MRN: [id number] Visit Date: 08/19/2017 Requested by: Biagio Borg, MD [place], [place] [address] PCP: Biagio Borg, MD  Subjective: Chief Complaint  Patient presents with  . Left Knee - Pain    HPI: Hannah Madden is a patient with left knee pain.  She has not been here in about 3 years.  She has been doing a lot of gardening and bending and stooping.  Localizes the pain medial aspect of the knee with no mechanical symptoms.  She had left knee arthroscopy in July 2015.  Knee brace does help.  She continues to exercise on a daily basis.  Hurts her to go up stairs.  She is not taking any medication for the problem.              ROS: All systems reviewed are negative as they relate to the chief complaint within the history of present illness.  Patient denies  fevers or chills.   Assessment & Plan: Visit Diagnoses:  1. Chronic pain of left knee     Plan: Impression is left knee pain with some progression of medial compartment arthritis and mild effusion.  Plan is left knee aspiration and injection.  She may need to get that done every 3 or 4 months.  I will preapproved for for Synvisc or gel injection.  When this cortisone shot wears off we can try that.  I will see her back as needed.  Follow-Up Instructions: Return if symptoms worsen or fail to improve.   Orders:  Orders Placed This Encounter  Procedures  . XR Knee 1-2 Views Left   No orders of the defined types were placed in this encounter.     Procedures: Large Joint Inj: L knee on 08/19/2017 11:56 AM Indications: diagnostic evaluation, joint swelling and pain Details: 18 G 1.5 in needle, superolateral approach  Arthrogram: No  Medications: 5 mL lidocaine 1 %; 40 mg methylPREDNISolone acetate 40 MG/ML; 4 mL bupivacaine 0.25 % Aspirate: 10 mL Outcome: tolerated well, no immediate complications Procedure, treatment  alternatives, risks and benefits explained, specific risks discussed. Consent was given by the patient. Immediately prior to procedure a time out was called to verify the correct patient, procedure, equipment, support staff and site/side marked as required. Patient was prepped and draped in the usual sterile fashion.       Clinical Data: No additional findings.  Objective: Vital Signs: There were no vitals taken for this visit.  Physical Exam:   Constitutional: Patient appears well-developed HEENT:  Head: Normocephalic Eyes:EOM are normal Neck: Normal range of motion Cardiovascular: Normal rate Pulmonary/chest: Effort normal Neurologic: Patient is alert Skin: Skin is warm Psychiatric: Patient has normal mood and affect    Ortho Exam: Ortho exam demonstrates mild effusion left knee with intact extensor mechanism.  Pedal pulses palpable.  Collateral cruciate ligaments are stable.  There is medial joint line tenderness and a little bit of snapping of the hamstring tendons with flexion.  Negative patellar apprehension and no groin pain with internal/external rotation of the leg.  Specialty Comments:  No specialty comments available.  Imaging: Xr Knee 1-2 Views Left  Result Date: 08/19/2017 AP lateral left knee reviewed.  Medial compartment arthritis has progressed.  Lateral compartment appears spared.  No loose bodies or fractures present.  Alignment intact.    PMFS History: Patient Active Problem List   Diagnosis Date Noted  . Acute sinus infection 02/13/2016  . Cough 03/01/2015  . Arthritis of left lower extremity 08/24/2014  . Pain and swelling of left knee 08/22/2014  . Acute medial meniscal tear 09/12/2013  . Preventative health care 08/24/2012  . CONTACT DERMATITIS 09/17/2007  . GLAUCOMA 08/24/2007  . DEGENERATIVE JOINT DISEASE 08/24/2007  . HYPERCHOLESTEROLEMIA 08/23/2007  . Anxiety state 08/23/2007  . GERD 08/23/2007  . ALOPECIA 08/23/2007  . Allergic state  08/23/2007   Past Medical History:  Diagnosis Date  . Alopecia    stress related and resolved since her Mother moved to SNF  . GERD (gastroesophageal reflux disease)    takes Nexium daily  . Glaucoma    uses eye drops daily  . H/O hiatal hernia   . Hypercholesteremia    takes Simvastatin daily  . Pneumonia 1960    Family History  Problem Relation Age of Onset  . COPD Neg Hx   . Diabetes Neg Hx   . Heart disease Neg Hx   . Stroke Neg Hx     Past Surgical History:  Procedure Laterality Date  . ABDOMINAL HYSTERECTOMY     fibroids.  . COLONOSCOPY    . KNEE ARTHROSCOPY WITH MEDIAL MENISECTOMY Left 09/12/2013   Procedure: KNEE ARTHROSCOPY WITH DEBRIDEMENT;  Surgeon: Meredith Pel, MD;  Location: [facility];  Service: Orthopedics;  Laterality: Left;   Social History   Occupational History  . Occupation: financial services    Comment: Gaffer, now Production manager  Tobacco Use  . Smoking status: Never Smoker  . Smokeless tobacco: Never Used  Substance and Sexual Activity  . Alcohol use: No  . Drug use: No  . Sexual activity: Yes    Partners: Female    Birth control/protection: Surgical

## 2017-08-20 ENCOUNTER — Telehealth (INDEPENDENT_AMBULATORY_CARE_PROVIDER_SITE_OTHER): Payer: Self-pay

## 2017-08-20 NOTE — Telephone Encounter (Signed)
Submitted application online for Monovisc, right knee. 

## 2017-08-26 ENCOUNTER — Encounter (INDEPENDENT_AMBULATORY_CARE_PROVIDER_SITE_OTHER): Payer: Self-pay | Admitting: Radiology

## 2017-08-26 DIAGNOSIS — H401133 Primary open-angle glaucoma, bilateral, severe stage: Secondary | ICD-10-CM | POA: Diagnosis not present

## 2017-08-26 DIAGNOSIS — H524 Presbyopia: Secondary | ICD-10-CM | POA: Diagnosis not present

## 2017-08-26 DIAGNOSIS — H52203 Unspecified astigmatism, bilateral: Secondary | ICD-10-CM | POA: Diagnosis not present

## 2017-08-26 DIAGNOSIS — H5203 Hypermetropia, bilateral: Secondary | ICD-10-CM | POA: Diagnosis not present

## 2017-08-26 NOTE — Progress Notes (Unsigned)
Monovisc right knee covered by both insurance, and should covre all costs.  Buy and bill allowed.    Will you please call patient and schedule with Dr Marlou Sa?  Thanks!

## 2017-08-27 ENCOUNTER — Telehealth (INDEPENDENT_AMBULATORY_CARE_PROVIDER_SITE_OTHER): Payer: Self-pay

## 2017-08-27 NOTE — Progress Notes (Signed)
Approved for right knee but patient was seen for her left knee. She would just like a call back # [phone]

## 2017-08-27 NOTE — Progress Notes (Signed)
Called patient, unable to leave vm.

## 2017-08-27 NOTE — Progress Notes (Signed)
Can you call patient and advise. Okay for patient to have injection for left knee, per Wendy May.  You can schedule.

## 2017-08-27 NOTE — Telephone Encounter (Signed)
Talked with patient concerning scheduling an appointment for gel injection and patient stated that this was not discussed with her at her last appointment with Dr. Marlou Sa and was told to F/U in 4 months.  Patient would prefer to discuss gel injection before proceeding.

## 2017-08-27 NOTE — Progress Notes (Signed)
Talked with patient concerning making an appointment for gel injection.

## 2017-09-04 ENCOUNTER — Telehealth: Payer: Self-pay | Admitting: Internal Medicine

## 2017-09-04 DIAGNOSIS — Z1239 Encounter for other screening for malignant neoplasm of breast: Secondary | ICD-10-CM

## 2017-09-04 NOTE — Addendum Note (Signed)
Addended by: Icesis Renn W on: 09/04/2017 03:09 PM   Modules accepted: Orders  

## 2017-09-04 NOTE — Telephone Encounter (Signed)
Pt would like a screening mammo

## 2017-09-04 NOTE — Telephone Encounter (Signed)
Does she mean screening or diagnositic for some reason?   Patients can often call for the screening type

## 2017-09-04 NOTE — Telephone Encounter (Signed)
Done erx 

## 2017-09-04 NOTE — Telephone Encounter (Signed)
Copied from [place] [id number]. Topic: Quick Communication - See Telephone Encounter >> Sep 04, 2017  2:26 PM Mylinda Latina, NT wrote: CRM for notification. See Telephone encounter for: 09/04/17. Patient called and states that she needs a order put in for a mammogram. Please is requesting a call back when this order is place. CB# [phone]

## 2017-09-08 ENCOUNTER — Ambulatory Visit (INDEPENDENT_AMBULATORY_CARE_PROVIDER_SITE_OTHER): Payer: Medicare PPO | Admitting: *Deleted

## 2017-09-08 DIAGNOSIS — Z23 Encounter for immunization: Secondary | ICD-10-CM | POA: Diagnosis not present

## 2017-09-25 ENCOUNTER — Telehealth (INDEPENDENT_AMBULATORY_CARE_PROVIDER_SITE_OTHER): Payer: Self-pay | Admitting: Orthopedic Surgery

## 2017-09-25 NOTE — Telephone Encounter (Signed)
IC scheduled appt 

## 2017-09-25 NOTE — Telephone Encounter (Signed)
Patient called asked if she can be worked into Dr Randel Pigg schedule for a knee injection. Patient said the cortisone injection did not work. Patient said she is having a lot of pain in her knee. The number to contact patient is [phone]

## 2017-10-01 ENCOUNTER — Ambulatory Visit (INDEPENDENT_AMBULATORY_CARE_PROVIDER_SITE_OTHER): Payer: Medicare PPO | Admitting: Orthopedic Surgery

## 2017-10-01 ENCOUNTER — Encounter (INDEPENDENT_AMBULATORY_CARE_PROVIDER_SITE_OTHER): Payer: Self-pay | Admitting: Orthopedic Surgery

## 2017-10-01 DIAGNOSIS — G8929 Other chronic pain: Secondary | ICD-10-CM

## 2017-10-01 DIAGNOSIS — M1712 Unilateral primary osteoarthritis, left knee: Secondary | ICD-10-CM | POA: Diagnosis not present

## 2017-10-01 DIAGNOSIS — M25562 Pain in left knee: Secondary | ICD-10-CM

## 2017-10-01 MED ORDER — HYALURONAN 88 MG/4ML IX SOSY
88.0000 mg | PREFILLED_SYRINGE | INTRA_ARTICULAR | Status: AC | PRN
  Administered 2017-10-01: 88 mg via INTRA_ARTICULAR

## 2017-10-01 MED ORDER — LIDOCAINE HCL 1 % IJ SOLN
5.0000 mL | INTRAMUSCULAR | Status: AC | PRN
  Administered 2017-10-01: 5 mL

## 2017-10-01 NOTE — Progress Notes (Signed)
   Procedure Note  Patient: Hannah Madden             Date of Birth: 14-Feb-1935           MRN: [id number]             Visit Date: 10/01/2017  Procedures: Visit Diagnoses: Unilateral primary osteoarthritis, left knee  Chronic pain of left knee  Large Joint Inj: L knee on 10/01/2017 9:27 PM Indications: pain, joint swelling and diagnostic evaluation Details: 18 G 1.5 in needle, superolateral approach  Arthrogram: No  Medications: 5 mL lidocaine 1 %; 88 mg Hyaluronan 88 MG/4ML Outcome: tolerated well, no immediate complications Procedure, treatment alternatives, risks and benefits explained, specific risks discussed. Consent was given by the patient. Immediately prior to procedure a time out was called to verify the correct patient, procedure, equipment, support staff and site/side marked as required. Patient was prepped and draped in the usual sterile fashion.

## 2017-10-13 ENCOUNTER — Encounter: Payer: Self-pay | Admitting: Gastroenterology

## 2017-10-27 DIAGNOSIS — H34811 Central retinal vein occlusion, right eye, with macular edema: Secondary | ICD-10-CM | POA: Diagnosis not present

## 2017-10-28 DIAGNOSIS — H31012 Macula scars of posterior pole (postinflammatory) (post-traumatic), left eye: Secondary | ICD-10-CM | POA: Diagnosis not present

## 2017-10-28 DIAGNOSIS — H43813 Vitreous degeneration, bilateral: Secondary | ICD-10-CM | POA: Diagnosis not present

## 2017-10-28 DIAGNOSIS — H34811 Central retinal vein occlusion, right eye, with macular edema: Secondary | ICD-10-CM | POA: Diagnosis not present

## 2017-11-20 ENCOUNTER — Ambulatory Visit
Admission: RE | Admit: 2017-11-20 | Discharge: 2017-11-20 | Disposition: A | Discharge status: Home / Self Care | Payer: Medicare PPO | Source: Ambulatory Visit | Attending: Internal Medicine | Admitting: Internal Medicine

## 2017-11-20 DIAGNOSIS — Z1239 Encounter for other screening for malignant neoplasm of breast: Secondary | ICD-10-CM

## 2017-11-20 DIAGNOSIS — Z1231 Encounter for screening mammogram for malignant neoplasm of breast: Secondary | ICD-10-CM | POA: Diagnosis not present

## 2017-12-04 ENCOUNTER — Encounter: Payer: Medicare PPO | Admitting: Internal Medicine

## 2017-12-07 DIAGNOSIS — Z460 Encounter for fitting and adjustment of spectacles and contact lenses: Secondary | ICD-10-CM | POA: Diagnosis not present

## 2017-12-07 DIAGNOSIS — H401133 Primary open-angle glaucoma, bilateral, severe stage: Secondary | ICD-10-CM | POA: Diagnosis not present

## 2017-12-08 ENCOUNTER — Other Ambulatory Visit (INDEPENDENT_AMBULATORY_CARE_PROVIDER_SITE_OTHER): Payer: Medicare PPO

## 2017-12-08 ENCOUNTER — Encounter: Payer: Self-pay | Admitting: Internal Medicine

## 2017-12-08 ENCOUNTER — Ambulatory Visit (INDEPENDENT_AMBULATORY_CARE_PROVIDER_SITE_OTHER): Payer: Medicare PPO | Admitting: Internal Medicine

## 2017-12-08 ENCOUNTER — Encounter: Payer: Medicare PPO | Admitting: Internal Medicine

## 2017-12-08 VITALS — BP 124/72 | HR 67 | Temp 97.6°F | Ht 63.0 in | Wt 158.0 lb

## 2017-12-08 DIAGNOSIS — Z0000 Encounter for general adult medical examination without abnormal findings: Secondary | ICD-10-CM

## 2017-12-08 DIAGNOSIS — Z0289 Encounter for other administrative examinations: Secondary | ICD-10-CM

## 2017-12-08 LAB — BASIC METABOLIC PANEL
BUN: 14 mg/dL (ref 6–23)
CHLORIDE: 99 meq/L (ref 96–112)
CO2: 26 mEq/L (ref 19–32)
Calcium: 9.5 mg/dL (ref 8.4–10.5)
Creatinine, Ser: 1.03 mg/dL (ref 0.40–1.20)
GFR: 66.15 mL/min (ref >60.00)
GLUCOSE: 89 mg/dL (ref 70–99)
Potassium: 4.1 mEq/L (ref 3.5–5.1)
Sodium: 133 mEq/L — ABNORMAL LOW (ref 135–145)

## 2017-12-08 LAB — HEPATIC FUNCTION PANEL
ALK PHOS: 89 U/L (ref 39–117)
ALT: 9 U/L (ref 0–35)
AST: 16 U/L (ref 0–37)
Albumin: 4.5 g/dL (ref 3.5–5.2)
BILIRUBIN DIRECT: 0 mg/dL (ref 0.0–0.3)
BILIRUBIN TOTAL: 0.5 mg/dL (ref 0.2–1.2)
TOTAL PROTEIN: 7.4 g/dL (ref 6.0–8.3)

## 2017-12-08 LAB — URINALYSIS, ROUTINE W REFLEX MICROSCOPIC
Bilirubin Urine: NEGATIVE
HGB URINE DIPSTICK: NEGATIVE
Ketones, ur: NEGATIVE
NITRITE: NEGATIVE
RBC / HPF: NONE SEEN (ref 0–?)
Specific Gravity, Urine: 1.01 (ref 1.000–1.030)
Total Protein, Urine: NEGATIVE
Urine Glucose: NEGATIVE
Urobilinogen, UA: 0.2 (ref 0.0–1.0)
pH: 6.5 (ref 5.0–8.0)

## 2017-12-08 LAB — CBC WITH DIFFERENTIAL/PLATELET
BASOS ABS: 0.1 10*3/uL (ref 0.0–0.1)
Basophils Relative: 1.4 % (ref 0.0–3.0)
Eosinophils Absolute: 0.2 10*3/uL (ref 0.0–0.7)
Eosinophils Relative: 2.3 % (ref 0.0–5.0)
HEMATOCRIT: 36.1 % (ref 36.0–46.0)
HEMOGLOBIN: 11.8 g/dL — AB (ref 12.0–15.0)
LYMPHS PCT: 39.4 % (ref 12.0–46.0)
Lymphs Abs: 3.2 10*3/uL (ref 0.7–4.0)
MCHC: 32.8 g/dL (ref 30.0–36.0)
MCV: 85.6 fl (ref 78.0–100.0)
MONOS PCT: 8.4 % (ref 3.0–12.0)
Monocytes Absolute: 0.7 10*3/uL (ref 0.1–1.0)
NEUTROS ABS: 3.9 10*3/uL (ref 1.4–7.7)
Neutrophils Relative %: 48.5 % (ref 43.0–77.0)
PLATELETS: 255 10*3/uL (ref 150.0–400.0)
RBC: 4.22 Mil/uL (ref 3.87–5.11)
RDW: 13.8 % (ref 11.5–15.5)
WBC: 8.1 10*3/uL (ref 4.0–10.5)

## 2017-12-08 LAB — LIPID PANEL
Cholesterol: 272 mg/dL — ABNORMAL HIGH (ref 0–200)
HDL: 61.7 mg/dL (ref >39.00)
LDL CALC: 184 mg/dL — AB (ref 0–99)
NONHDL: 210.67
Total CHOL/HDL Ratio: 4
Triglycerides: 135 mg/dL (ref 0.0–149.0)
VLDL: 27 mg/dL (ref 0.0–40.0)

## 2017-12-08 LAB — TSH: TSH: 1.12 u[IU]/mL (ref 0.35–4.50)

## 2017-12-08 NOTE — Assessment & Plan Note (Signed)

## 2017-12-08 NOTE — Patient Instructions (Signed)

## 2017-12-08 NOTE — Progress Notes (Signed)
Subjective:    Patient ID: Hannah Madden, female    DOB: [birth year], 83 y.o.   MRN: [id number]  HPI  Here for wellness and f/u;  Overall doing ok;  Pt denies Chest pain, worsening SOB, DOE, wheezing, orthopnea, PND, worsening LE edema, palpitations, dizziness or syncope.  Pt denies neurological change such as new headache, facial or extremity weakness.  Pt denies polydipsia, polyuria, or low sugar symptoms. Pt states overall good compliance with treatment and medications, good tolerability, and has been trying to follow appropriate diet.  Pt denies worsening depressive symptoms, suicidal ideation or panic. No fever, night sweats, wt loss, loss of appetite, or other constitutional symptoms.  Pt states good ability with ADL's, has low fall risk, home safety reviewed and adequate, no other significant changes in hearing or vision, and only occasionally active with exercise.  States only taking protonix prn, and no statin, trying to watch diet but states not always successful Past Medical History:  Diagnosis Date  . Alopecia    stress related and resolved since her Mother moved to SNF  . GERD (gastroesophageal reflux disease)    takes Nexium daily  . Glaucoma    uses eye drops daily  . H/O hiatal hernia   . Hypercholesteremia    takes Simvastatin daily  . Pneumonia 1960   Past Surgical History:  Procedure Laterality Date  . ABDOMINAL HYSTERECTOMY     fibroids.  . COLONOSCOPY    . KNEE ARTHROSCOPY WITH MEDIAL MENISECTOMY Left 09/12/2013   Procedure: KNEE ARTHROSCOPY WITH DEBRIDEMENT;  Surgeon: Meredith Pel, MD;  Location: [facility];  Service: Orthopedics;  Laterality: Left;    reports that she has never smoked. She has never used smokeless tobacco. She reports that she does not drink alcohol or use drugs. family history is not on file. Allergies  Allergen Reactions  . Atorvastatin     REACTION: INTOL to Lipitor w/ leg pain  . Penicillins     REACTION: hives  . Protonix  [Pantoprazole Sodium] Other (See Comments)    diarrhea   Current Outpatient Medications on File Prior to Visit  Medication Sig Dispense Refill  . bifidobacterium infantis (ALIGN) capsule Take 1 capsule by mouth daily.    . brimonidine-timolol (COMBIGAN) 0.2-0.5 % ophthalmic solution Place 1 drop into both eyes 2 (two) times daily.    . brinzolamide (AZOPT) 1 % ophthalmic suspension Place 1 drop into both eyes 2 (two) times daily.    . clorazepate (TRANXENE) 7.5 MG tablet Take 1 tablet (7.5 mg total) by mouth 2 (two) times daily as needed for anxiety. 60 tablet 2  . Coenzyme Q10 (COQ10) 100 MG CAPS Take 100 mg by mouth daily.     Marland Kitchen desonide (DESOWEN) 0.05 % ointment Apply 1 application topically 2 (two) times daily. 60 g 0  . Garlic 132 MG TABS Take 100 mg by mouth daily.    . Magnesium 500 MG TABS Take 1 tablet by mouth daily.    . pantoprazole (PROTONIX) 40 MG tablet Take 1 tablet (40 mg total) by mouth daily. 90 tablet 3  . vitamin C (ASCORBIC ACID) 500 MG tablet Take 1,000 mg by mouth daily.     . vitamin E 400 UNIT capsule Take 400 Units by mouth daily.       No current facility-administered medications on file prior to visit.    Review of Systems Constitutional: Negative for other unusual diaphoresis, sweats, appetite or weight changes HENT: Negative for other worsening hearing  loss, ear pain, facial swelling, mouth sores or neck stiffness.   Eyes: Negative for other worsening pain, redness or other visual disturbance.  Respiratory: Negative for other stridor or swelling Cardiovascular: Negative for other palpitations or other chest pain  Gastrointestinal: Negative for worsening diarrhea or loose stools, blood in stool, distention or other pain Genitourinary: Negative for hematuria, flank pain or other change in urine volume.  Musculoskeletal: Negative for myalgias or other joint swelling.  Skin: Negative for other color change, or other wound or worsening drainage.  Neurological:  Negative for other syncope or numbness. Hematological: Negative for other adenopathy or swelling Psychiatric/Behavioral: Negative for hallucinations, other worsening agitation, SI, self-injury, or new decreased concentration All other system neg pt    Objective:   Physical Exam BP 124/72   Pulse 67   Temp 97.6 F (36.4 C) (Oral)   Ht 5\' 3"  (1.6 m)   Wt 158 lb (71.7 kg)   SpO2 98%   BMI 27.99 kg/m  VS noted,  Constitutional: Pt is oriented to person, place, and time. Appears well-developed and well-nourished, in no significant distress and comfortable Head: Normocephalic and atraumatic  Eyes: Conjunctivae and EOM are normal. Pupils are equal, round, and reactive to light Right Ear: External ear normal without discharge Left Ear: External ear normal without discharge Nose: Nose without discharge or deformity Mouth/Throat: Oropharynx is without other ulcerations and moist  Neck: Normal range of motion. Neck supple. No JVD present. No tracheal deviation present or significant neck LA or mass Cardiovascular: Normal rate, regular rhythm, normal heart sounds and intact distal pulses.   Pulmonary/Chest: WOB normal and breath sounds without rales or wheezing  Abdominal: Soft. Bowel sounds are normal. NT. No HSM  Musculoskeletal: Normal range of motion. Exhibits no edema Lymphadenopathy: Has no other cervical adenopathy.  Neurological: Pt is alert and oriented to person, place, and time. Pt has normal reflexes. No cranial nerve deficit. Motor grossly intact, Gait intact Skin: Skin is warm and dry. No rash noted or new ulcerations Psychiatric:  Has normal mood and affect. Behavior is normal without agitation No other exam findings Lab Results  Component Value Date   WBC 7.1 12/03/2016   HGB 11.3 (L) 12/03/2016   HCT 34.1 (L) 12/03/2016   PLT 235.0 12/03/2016   GLUCOSE 105 (H) 12/03/2016   CHOL 164 12/03/2016   TRIG 71.0 12/03/2016   HDL 50.80 12/03/2016   LDLDIRECT 152.0 10/25/2015     LDLCALC 99 12/03/2016   ALT 9 12/03/2016   AST 15 12/03/2016   NA 139 12/03/2016   K 4.4 12/03/2016   CL 106 12/03/2016   CREATININE 1.05 12/03/2016   BUN 11 12/03/2016   CO2 25 12/03/2016   TSH 1.34 12/03/2016      Assessment & Plan:

## 2017-12-16 NOTE — Telephone Encounter (Signed)
error 

## 2018-01-04 ENCOUNTER — Telehealth: Payer: Self-pay | Admitting: Internal Medicine

## 2018-01-04 MED ORDER — CLORAZEPATE DIPOTASSIUM 7.5 MG PO TABS: 7.5000 mg | ORAL_TABLET | Freq: Two times a day (BID) | ORAL | 2 refills | Status: DC | PRN

## 2018-01-04 NOTE — Telephone Encounter (Signed)
Done erx 

## 2018-01-05 ENCOUNTER — Ambulatory Visit (INDEPENDENT_AMBULATORY_CARE_PROVIDER_SITE_OTHER): Payer: Medicare PPO | Admitting: Family Medicine

## 2018-01-05 ENCOUNTER — Encounter (INDEPENDENT_AMBULATORY_CARE_PROVIDER_SITE_OTHER): Payer: Self-pay | Admitting: Family Medicine

## 2018-01-05 DIAGNOSIS — M1712 Unilateral primary osteoarthritis, left knee: Secondary | ICD-10-CM | POA: Diagnosis not present

## 2018-01-05 MED ORDER — METHYLPREDNISOLONE ACETATE 40 MG/ML IJ SUSP: 40.0000 mg | Freq: Once | INTRAMUSCULAR | Status: DC

## 2018-01-05 NOTE — Progress Notes (Signed)
  Hannah Madden - 83 y.o. female MRN [id number]  Date of birth: 11/18/1934    SUBJECTIVE:      Chief Complaint:/ HPI:  83 year old female with left knee pain. -Patient has a history of arthritis in her knees bilaterally.  She has been having increasing left knee pain over the medial knee.  She denies any new injury.  Pain is worse with increased activity, particularly walking.  Is improved with rest.  Wearing a knee brace helps.  She denies any erythema or bruising.  She does note some swelling.  She denies any numbness or tingling.  No associated skin changes.  Approximately 3 months ago she had Visco supplementation shots which she felt were not helpful.  She desires steroid injection today.   ROS:     See HPI  PERTINENT  PMH / PSH FH / / SH:  Past Medical, Surgical, Social, and Family History Reviewed & Updated in the EMR.    OBJECTIVE: There were no vitals taken for this visit.  Physical Exam:  Vital signs are reviewed.  GEN: Alert and oriented, NAD Pulm: Breathing unlabored PSY: normal mood, congruent affect  MSK: Left Knee: - Inspection: no gross deformity.  Mild effusion.  No erythema or bruising. Skin intact - Palpation: Tenderness over the medial patellofemoral joint as well as the medial joint line - ROM: full active ROM with flexion and extension in knee and hip - Strength: 5/5 strength - Neuro/vasc: NV intact - Special Tests: - LIGAMENTS: negative anterior and posterior drawer, negative Lachman's, no MCL or LCL laxity  -- MENISCUS: negative McMurray's -- PF JOINT: nml patellar mobility without apprehension  Hips: Full passive IR/ER without pain   ASSESSMENT & PLAN:  1.  Right knee pain secondary to osteoarthritis. No instability on exam. -Intra-articular steroid injection performed today as detailed below - Recommendations made regarding vitamin/herbal  supplementation - Follow-up as needed   Procedure performed: knee intraarticular corticosteroid  injection; palpation guided  Consent obtained and verified. Time-out conducted. Noted no overlying erythema, induration, or other signs of local infection. The  superior lateral patellofemoral joint space was palpated and marked. The overlying skin was prepped in a sterile fashion. Topical analgesic spray: Ethyl chloride. Joint: Left knee Needle: 25-gauge, 1.5 inch Completed without difficulty. Meds: Depo-Medrol 40 mg, lidocaine 3cc  Advised to call if fevers/chills, erythema, induration, drainage, or persistent bleeding.

## 2018-01-05 NOTE — Progress Notes (Signed)
I saw and examined the patient with Dr. Okey Dupre and agree with assessment and plan as outlined.  Cortisone injection today.  Try magnesium, vitamin D3, glucosamine, turmeric as well.

## 2018-01-14 ENCOUNTER — Telehealth: Payer: Self-pay

## 2018-01-14 MED ORDER — ONDANSETRON HCL 4 MG PO TABS: 4.0000 mg | ORAL_TABLET | Freq: Three times a day (TID) | ORAL | 0 refills | Status: DC | PRN

## 2018-01-14 NOTE — Telephone Encounter (Signed)
Pt requested that her nausea tablet be called in. However I do not see that listed on her chart. Please advise.

## 2018-01-14 NOTE — Telephone Encounter (Signed)
Done erx 

## 2018-01-14 NOTE — Addendum Note (Signed)
Addended by: Biagio Borg on: 01/14/2018 12:53 PM   Modules accepted: Orders

## 2018-01-22 ENCOUNTER — Telehealth: Payer: Self-pay | Admitting: Internal Medicine

## 2018-01-22 MED ORDER — METOCLOPRAMIDE HCL 5 MG PO TABS: ORAL_TABLET | ORAL | 1 refills | Status: DC

## 2018-01-22 NOTE — Telephone Encounter (Signed)
Med not on med list. Pls advise if ok to refill../lmb 

## 2018-01-22 NOTE — Telephone Encounter (Signed)
Copied from [place] [id number]. Topic: Quick Communication - Rx Refill/Question >> Jan 22, 2018  1:45 PM [name] A wrote: Medication: metoCLOPramide (REGLAN) tablet 5-10 mg   [[id number]]  Has the patient contacted their pharmacy? Yes  (Agent: If no, request that the patient contact the pharmacy for the refill.) (Agent: If yes, when and what did the pharmacy advise?)  Preferred Pharmacy (with phone number or street name): [facility], [address] AT [address] [place] [address] Phone: [phone] Fax: [phone]    Agent: Please be advised that RX refills may take up to 3 business days. We ask that you follow-up with your pharmacy.

## 2018-01-22 NOTE — Telephone Encounter (Signed)
Done erx 

## 2018-02-05 ENCOUNTER — Encounter: Payer: Medicare PPO | Admitting: Internal Medicine

## 2018-03-09 DIAGNOSIS — H31012 Macula scars of posterior pole (postinflammatory) (post-traumatic), left eye: Secondary | ICD-10-CM | POA: Diagnosis not present

## 2018-03-09 DIAGNOSIS — H35371 Puckering of macula, right eye: Secondary | ICD-10-CM | POA: Diagnosis not present

## 2018-03-09 DIAGNOSIS — H43813 Vitreous degeneration, bilateral: Secondary | ICD-10-CM | POA: Diagnosis not present

## 2018-03-09 DIAGNOSIS — H34811 Central retinal vein occlusion, right eye, with macular edema: Secondary | ICD-10-CM | POA: Diagnosis not present

## 2018-03-10 ENCOUNTER — Telehealth: Payer: Self-pay | Admitting: Internal Medicine

## 2018-03-11 NOTE — Telephone Encounter (Signed)
Please send the Clorazepate Dipotassium 7.5 MG prescription to [facility] on [address].

## 2018-03-11 NOTE — Telephone Encounter (Signed)
[place] called and spoke to [employer], Merchant navy officer. I asked did they receive the clorazepate 7.5 mg #60/5 refills today via fax, he verbalized it was received and ready for pickup. Patient called and advised of the above, she verbalized understanding.

## 2018-03-16 ENCOUNTER — Encounter: Payer: Self-pay | Admitting: Internal Medicine

## 2018-03-16 ENCOUNTER — Ambulatory Visit (INDEPENDENT_AMBULATORY_CARE_PROVIDER_SITE_OTHER): Payer: Medicare PPO | Admitting: Internal Medicine

## 2018-03-16 VITALS — BP 136/84 | HR 84 | Temp 98.2°F | Ht 63.0 in | Wt 154.0 lb

## 2018-03-16 DIAGNOSIS — R413 Other amnesia: Secondary | ICD-10-CM | POA: Diagnosis not present

## 2018-03-16 NOTE — Patient Instructions (Signed)
OK to take  Vit B12 - 1 per day, and Vit D3 2000 units per day  Your MMSE score was 25 today - normal (though there were some minor changes on a couple of questions)  Please continue all other medications as before, and refills have been done if requested.  Please have the pharmacy call with any other refills you may need.  Please continue your efforts at being more active, low cholesterol diet, and weight control.  Please keep your appointments with your specialists as you may have planned

## 2018-03-16 NOTE — Assessment & Plan Note (Signed)
Pt has some evidence for minor memory issues but scores at low normal for age; would not certify she has mild cognitive impairment or dementia based on this testing

## 2018-03-16 NOTE — Progress Notes (Signed)
Subjective:    Patient ID: Hannah Madden, female    DOB: 1934/06/17, 84 y.o.   MRN: [id number]  HPI  Here after daughter who only sees her infrequently mentioned she though pt has some memory changes and pt now quite concerned, specifically asks for MMSE testing.  Pt denies chest pain, increased sob or doe, wheezing, orthopnea, PND, increased LE swelling, palpitations, dizziness or syncope.  Pt denies new neurological symptoms such as new headache, or facial or extremity weakness or numbness   Pt denies polydipsia, polyuria,   Past Medical History:  Diagnosis Date  . Alopecia    stress related and resolved since her Mother moved to SNF  . GERD (gastroesophageal reflux disease)    takes Nexium daily  . Glaucoma    uses eye drops daily  . H/O hiatal hernia   . Hypercholesteremia    takes Simvastatin daily  . Pneumonia 1960   Past Surgical History:  Procedure Laterality Date  . ABDOMINAL HYSTERECTOMY     fibroids.  . COLONOSCOPY    . KNEE ARTHROSCOPY WITH MEDIAL MENISECTOMY Left 09/12/2013   Procedure: KNEE ARTHROSCOPY WITH DEBRIDEMENT;  Surgeon: Meredith Pel, MD;  Location: [facility];  Service: Orthopedics;  Laterality: Left;    reports that she has never smoked. She has never used smokeless tobacco. She reports that she does not drink alcohol or use drugs. family history is not on file. Allergies  Allergen Reactions  . Atorvastatin     REACTION: INTOL to Lipitor w/ leg pain  . Penicillins     REACTION: hives   Current Outpatient Medications on File Prior to Visit  Medication Sig Dispense Refill  . bifidobacterium infantis (ALIGN) capsule Take 1 capsule by mouth daily.    . brimonidine-timolol (COMBIGAN) 0.2-0.5 % ophthalmic solution Place 1 drop into both eyes 2 (two) times daily.    . brinzolamide (AZOPT) 1 % ophthalmic suspension Place 1 drop into both eyes 2 (two) times daily.    . clorazepate (TRANXENE) 7.5 MG tablet TAKE 1 TABLET BY MOUTH TWICE DAILY AS NEEDED FOR  ANXIETY 60 tablet 5  . Coenzyme Q10 (COQ10) 100 MG CAPS Take 100 mg by mouth daily.     Marland Kitchen desonide (DESOWEN) 0.05 % ointment Apply 1 application topically 2 (two) times daily. 60 g 0  . Garlic 546 MG TABS Take 100 mg by mouth daily.    . Magnesium 500 MG TABS Take 1 tablet by mouth daily.    . metoCLOPramide (REGLAN) 5 MG tablet Take 1 - 2 tab by mouth three times daily as needed 60 tablet 1  . ondansetron (ZOFRAN) 4 MG tablet Take 1 tablet (4 mg total) by mouth every 8 (eight) hours as needed for nausea or vomiting. 20 tablet 0  . pantoprazole (PROTONIX) 40 MG tablet Take 1 tablet (40 mg total) by mouth daily. 90 tablet 3  . vitamin C (ASCORBIC ACID) 500 MG tablet Take 1,000 mg by mouth daily.     . vitamin E 400 UNIT capsule Take 400 Units by mouth daily.       Current Facility-Administered Medications on File Prior to Visit  Medication Dose Route Frequency Provider Last Rate Last Dose  . methylPREDNISolone acetate (DEPO-MEDROL) injection 40 mg  40 mg Intra-articular Once Hilts, Michael, MD       Review of Systems  Constitutional: Negative for other unusual diaphoresis or sweats HENT: Negative for ear discharge or swelling Eyes: Negative for other worsening visual disturbances Respiratory: Negative  for stridor or other swelling  Gastrointestinal: Negative for worsening distension or other blood Genitourinary: Negative for retention or other urinary change Musculoskeletal: Negative for other MSK pain or swelling Skin: Negative for color change or other new lesions Neurological: Negative for worsening tremors and other numbness  Psychiatric/Behavioral: Negative for worsening agitation or other fatigue All other system neg per pt    Objective:   Physical Exam BP 136/84   Pulse 84   Temp 98.2 F (36.8 C) (Oral)   Ht 5\' 3"  (1.6 m)   Wt 154 lb (69.9 kg)   SpO2 92%   BMI 27.28 kg/m  VS noted,  Constitutional: Pt appears in NAD HENT: Head: NCAT.  Right Ear: External ear normal.    Left Ear: External ear normal.  Eyes: . Pupils are equal, round, and reactive to light. Conjunctivae and EOM are normal Nose: without d/c or deformity Neck: Neck supple. Gross normal ROM Cardiovascular: Normal rate and regular rhythm.   Pulmonary/Chest: Effort normal and breath sounds without rales or wheezing.  Abd:  Soft, NT, ND, + BS, no organomegaly Neurological: Pt is alert. At baseline orientation, motor grossly intact Skin: Skin is warm. No rashes, other new lesions, no LE edema Psychiatric: Pt behavior is normal without agitation  No other exam findings  MMSE today - 25 pts     Assessment & Plan:

## 2018-03-24 DIAGNOSIS — H5789 Other specified disorders of eye and adnexa: Secondary | ICD-10-CM | POA: Diagnosis not present

## 2018-03-24 DIAGNOSIS — H401133 Primary open-angle glaucoma, bilateral, severe stage: Secondary | ICD-10-CM | POA: Diagnosis not present

## 2018-03-30 DIAGNOSIS — M545 Low back pain: Secondary | ICD-10-CM | POA: Diagnosis not present

## 2018-03-30 DIAGNOSIS — M5442 Lumbago with sciatica, left side: Secondary | ICD-10-CM | POA: Diagnosis not present

## 2018-03-30 DIAGNOSIS — M47816 Spondylosis without myelopathy or radiculopathy, lumbar region: Secondary | ICD-10-CM | POA: Diagnosis not present

## 2018-04-05 DIAGNOSIS — H401132 Primary open-angle glaucoma, bilateral, moderate stage: Secondary | ICD-10-CM | POA: Diagnosis not present

## 2018-04-07 ENCOUNTER — Ambulatory Visit (INDEPENDENT_AMBULATORY_CARE_PROVIDER_SITE_OTHER): Payer: Medicare PPO

## 2018-04-07 ENCOUNTER — Ambulatory Visit (INDEPENDENT_AMBULATORY_CARE_PROVIDER_SITE_OTHER): Payer: Medicare PPO | Admitting: Orthopedic Surgery

## 2018-04-07 ENCOUNTER — Ambulatory Visit (INDEPENDENT_AMBULATORY_CARE_PROVIDER_SITE_OTHER): Payer: Self-pay

## 2018-04-07 DIAGNOSIS — M545 Low back pain: Secondary | ICD-10-CM

## 2018-04-07 DIAGNOSIS — M25562 Pain in left knee: Secondary | ICD-10-CM | POA: Diagnosis not present

## 2018-04-07 DIAGNOSIS — G8929 Other chronic pain: Secondary | ICD-10-CM

## 2018-04-07 DIAGNOSIS — M4696 Unspecified inflammatory spondylopathy, lumbar region: Secondary | ICD-10-CM | POA: Diagnosis not present

## 2018-04-08 ENCOUNTER — Encounter (INDEPENDENT_AMBULATORY_CARE_PROVIDER_SITE_OTHER): Payer: Self-pay | Admitting: Orthopedic Surgery
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
## 2018-04-13 ENCOUNTER — Other Ambulatory Visit: Payer: Self-pay | Admitting: Internal Medicine

## 2018-05-21 ENCOUNTER — Other Ambulatory Visit: Payer: Self-pay

## 2018-05-21 ENCOUNTER — Ambulatory Visit (INDEPENDENT_AMBULATORY_CARE_PROVIDER_SITE_OTHER): Payer: Medicare PPO | Admitting: Internal Medicine

## 2018-05-21 ENCOUNTER — Encounter: Payer: Self-pay | Admitting: Internal Medicine

## 2018-05-21 VITALS — BP 126/84 | HR 72 | Temp 98.7°F | Ht 63.0 in | Wt 152.0 lb

## 2018-05-21 DIAGNOSIS — J309 Allergic rhinitis, unspecified: Secondary | ICD-10-CM | POA: Diagnosis not present

## 2018-05-21 DIAGNOSIS — J029 Acute pharyngitis, unspecified: Secondary | ICD-10-CM | POA: Diagnosis not present

## 2018-05-21 DIAGNOSIS — E7800 Pure hypercholesterolemia, unspecified: Secondary | ICD-10-CM | POA: Diagnosis not present

## 2018-05-21 MED ORDER — ROSUVASTATIN CALCIUM 20 MG PO TABS: 20.0000 mg | ORAL_TABLET | Freq: Every day | ORAL | 3 refills | Status: DC

## 2018-05-21 MED ORDER — TRIAMCINOLONE ACETONIDE 55 MCG/ACT NA AERO: 2.0000 | INHALATION_SPRAY | Freq: Every day | NASAL | 12 refills | Status: DC

## 2018-05-21 MED ORDER — AZITHROMYCIN 250 MG PO TABS: ORAL_TABLET | ORAL | 1 refills | Status: DC

## 2018-05-21 NOTE — Assessment & Plan Note (Signed)
Mild to mod, for add nasacort asd,  to f/u any worsening symptoms or concerns 

## 2018-05-21 NOTE — Progress Notes (Signed)
Subjective:    Patient ID: Hannah Madden, female    DOB: 04-May-1934, 84 y.o.   MRN: [id number]  HPI   Here with 2-3 days acute onset fever, severe ST pain, pressure, headache, general weakness and malaise, and greenish d/c, with mild ST and cough, but pt denies chest pain, wheezing, increased sob or doe, orthopnea, PND, increased LE swelling, palpitations, dizziness or syncope. Does have several wks ongoing nasal allergy symptoms with clearish congestion, itch and sneezing, without fever, pain, ST, cough, swelling or wheezing, now well controlled with claritin.   Pt denies polydipsia, polyuria Past Medical History:  Diagnosis Date  . Alopecia    stress related and resolved since her Mother moved to SNF  . GERD (gastroesophageal reflux disease)    takes Nexium daily  . Glaucoma    uses eye drops daily  . H/O hiatal hernia   . Hypercholesteremia    takes Simvastatin daily  . Pneumonia 1960   Past Surgical History:  Procedure Laterality Date  . ABDOMINAL HYSTERECTOMY     fibroids.  . COLONOSCOPY    . KNEE ARTHROSCOPY WITH MEDIAL MENISECTOMY Left 09/12/2013   Procedure: KNEE ARTHROSCOPY WITH DEBRIDEMENT;  Surgeon: Meredith Pel, MD;  Location: [facility];  Service: Orthopedics;  Laterality: Left;    reports that she has never smoked. She has never used smokeless tobacco. She reports that she does not drink alcohol or use drugs. family history is not on file. Allergies  Allergen Reactions  . Atorvastatin     REACTION: INTOL to Lipitor w/ leg pain  . Penicillins     REACTION: hives   Current Outpatient Medications on File Prior to Visit  Medication Sig Dispense Refill  . bifidobacterium infantis (ALIGN) capsule Take 1 capsule by mouth daily.    . brimonidine-timolol (COMBIGAN) 0.2-0.5 % ophthalmic solution Place 1 drop into both eyes 2 (two) times daily.    . brinzolamide (AZOPT) 1 % ophthalmic suspension Place 1 drop into both eyes 2 (two) times daily.    . clorazepate  (TRANXENE) 7.5 MG tablet TAKE 1 TABLET BY MOUTH TWICE DAILY AS NEEDED FOR ANXIETY 60 tablet 5  . Coenzyme Q10 (COQ10) 100 MG CAPS Take 100 mg by mouth daily.     Marland Kitchen desonide (DESOWEN) 0.05 % ointment Apply 1 application topically 2 (two) times daily. 60 g 0  . Garlic 782 MG TABS Take 100 mg by mouth daily.    . Magnesium 500 MG TABS Take 1 tablet by mouth daily.    . metoCLOPramide (REGLAN) 5 MG tablet Take 1 - 2 tab by mouth three times daily as needed 60 tablet 1  . ondansetron (ZOFRAN) 4 MG tablet Take 1 tablet (4 mg total) by mouth every 8 (eight) hours as needed for nausea or vomiting. 20 tablet 0  . pantoprazole (PROTONIX) 40 MG tablet TAKE 1 TABLET DAILY 90 tablet 1  . vitamin C (ASCORBIC ACID) 500 MG tablet Take 1,000 mg by mouth daily.     . vitamin E 400 UNIT capsule Take 400 Units by mouth daily.       Current Facility-Administered Medications on File Prior to Visit  Medication Dose Route Frequency Provider Last Rate Last Dose  . methylPREDNISolone acetate (DEPO-MEDROL) injection 40 mg  40 mg Intra-articular Once Hilts, Michael, MD       Review of Systems  Constitutional: Negative for other unusual diaphoresis or sweats HENT: Negative for ear discharge or swelling Eyes: Negative for other worsening visual disturbances  Respiratory: Negative for stridor or other swelling  Gastrointestinal: Negative for worsening distension or other blood Genitourinary: Negative for retention or other urinary change Musculoskeletal: Negative for other MSK pain or swelling Skin: Negative for color change or other new lesions Neurological: Negative for worsening tremors and other numbness  Psychiatric/Behavioral: Negative for worsening agitation or other fatigue All other system neg per pt    Objective:   Physical Exam BP 126/84   Pulse 72   Temp 98.7 F (37.1 C) (Oral)   Ht 5\' 3"  (1.6 m)   Wt 152 lb (68.9 kg)   SpO2 98%   BMI 26.93 kg/m  VS noted, mild ill Constitutional: Pt appears in  NAD HENT: Head: NCAT.  Right Ear: External ear normal.  Left Ear: External ear normal.  Bilat tm's with mild erythema.  Max sinus areas mod tender.  Pharynx with severe erythema, no exudate Eyes: . Pupils are equal, round, and reactive to light. Conjunctivae and EOM are normal Nose: without d/c or deformity Neck: Neck supple. Gross normal ROM Cardiovascular: Normal rate and regular rhythm.   Pulmonary/Chest: Effort normal and breath sounds without rales or wheezing.  Neurological: Pt is alert. At baseline orientation, motor grossly intact Skin: Skin is warm. No rashes, other new lesions, no LE edema Psychiatric: Pt behavior is normal without agitation  No other exam findings Lab Results  Component Value Date   WBC 8.1 12/08/2017   HGB 11.8 (L) 12/08/2017   HCT 36.1 12/08/2017   PLT 255.0 12/08/2017   GLUCOSE 89 12/08/2017   CHOL 272 (H) 12/08/2017   TRIG 135.0 12/08/2017   HDL 61.70 12/08/2017   LDLDIRECT 152.0 10/25/2015   LDLCALC 184 (H) 12/08/2017   ALT 9 12/08/2017   AST 16 12/08/2017   NA 133 (L) 12/08/2017   K 4.1 12/08/2017   CL 99 12/08/2017   CREATININE 1.03 12/08/2017   BUN 14 12/08/2017   CO2 26 12/08/2017   TSH 1.12 12/08/2017         Assessment & Plan:

## 2018-05-21 NOTE — Assessment & Plan Note (Signed)
Mild to mod, for antibx course,  to f/u any worsening symptoms or concerns 

## 2018-05-21 NOTE — Patient Instructions (Signed)
Please take all new medication as prescribed - the antibiotic, and nasacort  Please continue all other medications as before, including the claritin  Please have the pharmacy call with any other refills you may need.  Please continue your efforts at being more active, low cholesterol diet, and weight control.  Please keep your appointments with your specialists as you may have planned

## 2018-05-21 NOTE — Assessment & Plan Note (Addendum)
Pt adamant declines further statin, mother lived to 69 with high cholesterol  Later as she leaves, she wants to restart a statin, Will do rx

## 2018-05-21 NOTE — Addendum Note (Signed)
Addended by: Biagio Borg on: 05/21/2018 01:37 PM   Modules accepted: Orders

## 2018-06-23 DIAGNOSIS — H34811 Central retinal vein occlusion, right eye, with macular edema: Secondary | ICD-10-CM | POA: Diagnosis not present

## 2018-07-14 ENCOUNTER — Telehealth: Payer: Self-pay | Admitting: Internal Medicine

## 2018-07-14 NOTE — Telephone Encounter (Signed)
Copied from [facility] [id number]. Topic: Quick Communication - Rx Refill/Question >> Jul 14, 2018 11:37 AM Celene Kras A wrote: Medication: Z pack  Has the patient contacted their pharmacy? No. Pt states she needs to get ahead of her sinus drainage. Please advise.  (Agent: If no, request that the patient contact the pharmacy for the refill.) (Agent: If yes, when and what did the pharmacy advise?)  Preferred Pharmacy (with phone number or street name): [facility] [place], [address] AT [address] [place] [address] Phone: [phone] Fax: [phone] Not a 24 hour pharmacy; exact hours not known.    Agent: Please be advised that RX refills may take up to 3 business days. We ask that you follow-up with your pharmacy.

## 2018-07-14 NOTE — Telephone Encounter (Signed)
NOTED

## 2018-07-14 NOTE — Telephone Encounter (Signed)
Shirron, can we try to addon for 4PM doxy?  Or even later tonight at 7 or later if needed

## 2018-07-14 NOTE — Telephone Encounter (Signed)
Patient is scheduled for next week.  She lost her husband.  The viewing of the body was today.  She had family and friends over.

## 2018-07-21 ENCOUNTER — Ambulatory Visit: Payer: Medicare PPO | Admitting: Internal Medicine

## 2018-07-21 NOTE — Progress Notes (Signed)
Patient ID: Hannah Madden, female   DOB: 1934-03-24, 84 y.o.   MRN: [id number]  Pt not seen  - error

## 2018-07-23 ENCOUNTER — Ambulatory Visit: Payer: Medicare PPO | Admitting: Internal Medicine

## 2018-07-24 ENCOUNTER — Encounter: Payer: Self-pay | Admitting: Internal Medicine

## 2018-07-24 NOTE — Patient Instructions (Signed)
Pt not seen - error 

## 2018-08-03 DIAGNOSIS — Z961 Presence of intraocular lens: Secondary | ICD-10-CM | POA: Diagnosis not present

## 2018-08-03 DIAGNOSIS — H30033 Focal chorioretinal inflammation, peripheral, bilateral: Secondary | ICD-10-CM | POA: Diagnosis not present

## 2018-08-03 DIAGNOSIS — H3581 Retinal edema: Secondary | ICD-10-CM | POA: Diagnosis not present

## 2018-08-03 DIAGNOSIS — H401132 Primary open-angle glaucoma, bilateral, moderate stage: Secondary | ICD-10-CM | POA: Diagnosis not present

## 2018-08-03 DIAGNOSIS — H348332 Tributary (branch) retinal vein occlusion, bilateral, stable: Secondary | ICD-10-CM | POA: Diagnosis not present

## 2018-08-25 DIAGNOSIS — H31012 Macula scars of posterior pole (postinflammatory) (post-traumatic), left eye: Secondary | ICD-10-CM | POA: Diagnosis not present

## 2018-08-25 DIAGNOSIS — H43813 Vitreous degeneration, bilateral: Secondary | ICD-10-CM | POA: Diagnosis not present

## 2018-08-25 DIAGNOSIS — H34811 Central retinal vein occlusion, right eye, with macular edema: Secondary | ICD-10-CM | POA: Diagnosis not present

## 2018-09-23 DIAGNOSIS — M170 Bilateral primary osteoarthritis of knee: Secondary | ICD-10-CM | POA: Diagnosis not present

## 2018-10-06 ENCOUNTER — Ambulatory Visit: Payer: Self-pay | Admitting: Orthopedic Surgery

## 2018-10-18 DIAGNOSIS — H348332 Tributary (branch) retinal vein occlusion, bilateral, stable: Secondary | ICD-10-CM | POA: Diagnosis not present

## 2018-10-18 DIAGNOSIS — H401132 Primary open-angle glaucoma, bilateral, moderate stage: Secondary | ICD-10-CM | POA: Diagnosis not present

## 2018-10-18 DIAGNOSIS — H524 Presbyopia: Secondary | ICD-10-CM | POA: Diagnosis not present

## 2018-10-18 DIAGNOSIS — Z961 Presence of intraocular lens: Secondary | ICD-10-CM | POA: Diagnosis not present

## 2018-11-05 ENCOUNTER — Telehealth: Payer: Self-pay

## 2018-11-05 MED ORDER — MELATONIN 3 MG PO TABS: ORAL_TABLET | ORAL | 3 refills | Status: DC

## 2018-11-05 NOTE — Telephone Encounter (Signed)
Pt has been informed.

## 2018-11-05 NOTE — Telephone Encounter (Signed)
Copied from [place] [id number]. Topic: General - Inquiry >> Nov 05, 2018 12:03 PM Richardo Priest, NT wrote: Reason for CRM: Patient called in stating that she lost her husband and is wanting help with getting something for helping her sleep due to it getting harder for her. Patient stated she lost husband in May and it has been getting worse for her. Please advise. >> Nov 05, 2018 12:36 PM Para Skeans A wrote: Spoke with patient, She has an CPE on 10/9. So she does not want to come in twice. She is asking if she can just have a RX for now. She states he has done this before.

## 2018-11-05 NOTE — Addendum Note (Signed)
Addended by: Biagio Borg on: 11/05/2018 12:50 PM   Modules accepted: Orders

## 2018-11-05 NOTE — Telephone Encounter (Signed)
Ok for melatonin qhs prn - done erx

## 2018-11-09 MED ORDER — TRAZODONE HCL 50 MG PO TABS: 25.0000 mg | ORAL_TABLET | Freq: Every evening | ORAL | 5 refills | Status: DC | PRN

## 2018-11-09 NOTE — Telephone Encounter (Signed)
Patient came into the office stating that the Melatonin has not helped. She is still having a lot of trouble. Her daughter was having the same issues and was prescribed amitriptyline 25 mg which has helped her tremendously! Hannah Madden would like to know if Dr Jenny Reichmann would be willing to prescribe this for her as well? Pharmacy: [facility]  Please advise.  Pt would like a call back to let her know.

## 2018-11-09 NOTE — Addendum Note (Signed)
Addended by: Biagio Borg on: 11/09/2018 04:10 PM   Modules accepted: Orders

## 2018-11-09 NOTE — Telephone Encounter (Signed)
Ok this is done erx 

## 2018-11-10 NOTE — Telephone Encounter (Signed)
Called pt no answer LMOM MD ok amitriptyline rx has been sent to [facility].Marland KitchenJohny Chess

## 2018-11-18 ENCOUNTER — Telehealth: Payer: Self-pay | Admitting: Internal Medicine

## 2018-11-18 MED ORDER — AMITRIPTYLINE HCL 50 MG PO TABS: ORAL_TABLET | ORAL | 1 refills | Status: DC

## 2018-11-18 NOTE — Telephone Encounter (Signed)
[facility] for change to elavil - rx done erx

## 2018-11-18 NOTE — Telephone Encounter (Signed)
Patient is calling for Dr. Jenny Reichmann for advice. The patient has been taking traZODone (DESYREL) 50 MG tablet [id number] After the death of her husband  She has been struggling to sleep.  And is requesting to change to Amitriplytine 25mg ?    Please advise if Dr. Jenny Reichmann will approve- [phone]  Preferred Pharmacy [facility].

## 2018-11-18 NOTE — Addendum Note (Signed)
Addended by: Biagio Borg on: 11/18/2018 03:24 PM   Modules accepted: Orders

## 2018-12-08 DIAGNOSIS — H31012 Macula scars of posterior pole (postinflammatory) (post-traumatic), left eye: Secondary | ICD-10-CM | POA: Diagnosis not present

## 2018-12-08 DIAGNOSIS — H43813 Vitreous degeneration, bilateral: Secondary | ICD-10-CM | POA: Diagnosis not present

## 2018-12-08 DIAGNOSIS — H34811 Central retinal vein occlusion, right eye, with macular edema: Secondary | ICD-10-CM | POA: Diagnosis not present

## 2018-12-10 ENCOUNTER — Encounter: Payer: Medicare PPO | Admitting: Internal Medicine

## 2018-12-14 ENCOUNTER — Other Ambulatory Visit (INDEPENDENT_AMBULATORY_CARE_PROVIDER_SITE_OTHER): Payer: Medicare PPO

## 2018-12-14 ENCOUNTER — Encounter: Payer: Self-pay | Admitting: Internal Medicine

## 2018-12-14 ENCOUNTER — Other Ambulatory Visit: Payer: Self-pay

## 2018-12-14 ENCOUNTER — Ambulatory Visit (INDEPENDENT_AMBULATORY_CARE_PROVIDER_SITE_OTHER): Payer: Medicare PPO | Admitting: Internal Medicine

## 2018-12-14 ENCOUNTER — Other Ambulatory Visit: Payer: Self-pay | Admitting: Internal Medicine

## 2018-12-14 VITALS — BP 122/76 | HR 79 | Temp 97.8°F | Ht 63.0 in | Wt 150.0 lb

## 2018-12-14 DIAGNOSIS — Z0000 Encounter for general adult medical examination without abnormal findings: Secondary | ICD-10-CM | POA: Diagnosis not present

## 2018-12-14 LAB — LIPID PANEL
Cholesterol: 189 mg/dL (ref 0–200)
HDL: 62.6 mg/dL (ref >39.00)
LDL Cholesterol: 107 mg/dL — ABNORMAL HIGH (ref 0–99)
NonHDL: 125.9
Total CHOL/HDL Ratio: 3
Triglycerides: 97 mg/dL (ref 0.0–149.0)
VLDL: 19.4 mg/dL (ref 0.0–40.0)

## 2018-12-14 LAB — CBC WITH DIFFERENTIAL/PLATELET
Basophils Absolute: 0.1 10*3/uL (ref 0.0–0.1)
Basophils Relative: 1.1 % (ref 0.0–3.0)
Eosinophils Absolute: 0.2 10*3/uL (ref 0.0–0.7)
Eosinophils Relative: 2.1 % (ref 0.0–5.0)
HCT: 34.7 % — ABNORMAL LOW (ref 36.0–46.0)
Hemoglobin: 11.5 g/dL — ABNORMAL LOW (ref 12.0–15.0)
Lymphocytes Relative: 36.8 % (ref 12.0–46.0)
Lymphs Abs: 3 10*3/uL (ref 0.7–4.0)
MCHC: 33.1 g/dL (ref 30.0–36.0)
MCV: 87.8 fl (ref 78.0–100.0)
Monocytes Absolute: 0.8 10*3/uL (ref 0.1–1.0)
Monocytes Relative: 9.9 % (ref 3.0–12.0)
Neutro Abs: 4.1 10*3/uL (ref 1.4–7.7)
Neutrophils Relative %: 50.1 % (ref 43.0–77.0)
Platelets: 221 10*3/uL (ref 150.0–400.0)
RBC: 3.95 Mil/uL (ref 3.87–5.11)
RDW: 13.6 % (ref 11.5–15.5)
WBC: 8.2 10*3/uL (ref 4.0–10.5)

## 2018-12-14 LAB — URINALYSIS, ROUTINE W REFLEX MICROSCOPIC
Bilirubin Urine: NEGATIVE
Ketones, ur: NEGATIVE
Nitrite: NEGATIVE
Specific Gravity, Urine: 1.01 (ref 1.000–1.030)
Total Protein, Urine: NEGATIVE
Urine Glucose: NEGATIVE
Urobilinogen, UA: 0.2 (ref 0.0–1.0)
pH: 7 (ref 5.0–8.0)

## 2018-12-14 LAB — HEPATIC FUNCTION PANEL
ALT: 13 U/L (ref 0–35)
AST: 17 U/L (ref 0–37)
Albumin: 4.3 g/dL (ref 3.5–5.2)
Alkaline Phosphatase: 86 U/L (ref 39–117)
Bilirubin, Direct: 0.1 mg/dL (ref 0.0–0.3)
Total Bilirubin: 0.4 mg/dL (ref 0.2–1.2)
Total Protein: 6.8 g/dL (ref 6.0–8.3)

## 2018-12-14 LAB — BASIC METABOLIC PANEL
BUN: 12 mg/dL (ref 6–23)
CO2: 27 mEq/L (ref 19–32)
Calcium: 9.6 mg/dL (ref 8.4–10.5)
Chloride: 103 mEq/L (ref 96–112)
Creatinine, Ser: 0.89 mg/dL (ref 0.40–1.20)
GFR: 73.48 mL/min (ref >60.00)
Glucose, Bld: 86 mg/dL (ref 70–99)
Potassium: 4.2 mEq/L (ref 3.5–5.1)
Sodium: 137 mEq/L (ref 135–145)

## 2018-12-14 LAB — TSH: TSH: 1.1 u[IU]/mL (ref 0.35–4.50)

## 2018-12-14 MED ORDER — CIPROFLOXACIN HCL 500 MG PO TABS: 500.0000 mg | ORAL_TABLET | Freq: Two times a day (BID) | ORAL | 0 refills | Status: AC

## 2018-12-14 MED ORDER — METOCLOPRAMIDE HCL 10 MG PO TABS: 10.0000 mg | ORAL_TABLET | Freq: Four times a day (QID) | ORAL | 2 refills | Status: DC | PRN

## 2018-12-14 NOTE — Progress Notes (Signed)
Subjective:    Patient ID: Hannah Madden, female    DOB: 06/19/1934, 84 y.o.   MRN: [id number]  HPI  Here for wellness and f/u;  Overall doing ok;  Pt denies Chest pain, worsening SOB, DOE, wheezing, orthopnea, PND, worsening LE edema, palpitations, dizziness or syncope.  Pt denies neurological change such as new headache, facial or extremity weakness.  Pt denies polydipsia, polyuria, or low sugar symptoms. Pt states overall good compliance with treatment and medications, good tolerability, and has been trying to follow appropriate diet.  Pt denies worsening depressive symptoms, suicidal ideation or panic. No fever, night sweats, wt loss, loss of appetite, or other constitutional symptoms.  Pt states good ability with ADL's, has low fall risk, home safety reviewed and adequate, no other significant changes in hearing or vision, and only occasionally active with exercise. No lower back pain. Has known left meniscal tear, followed by Dr Marlou Sa, controlled with brace and topical gel.. No giveaways or falls.  Husband passed in May 202 related to agent orange.  No new complaints Past Medical History:  Diagnosis Date  . Alopecia    stress related and resolved since her Mother moved to SNF  . GERD (gastroesophageal reflux disease)    takes Nexium daily  . Glaucoma    uses eye drops daily  . H/O hiatal hernia   . Hypercholesteremia    takes Simvastatin daily  . Pneumonia 1960   Past Surgical History:  Procedure Laterality Date  . ABDOMINAL HYSTERECTOMY     fibroids.  . COLONOSCOPY    . KNEE ARTHROSCOPY WITH MEDIAL MENISECTOMY Left 09/12/2013   Procedure: KNEE ARTHROSCOPY WITH DEBRIDEMENT;  Surgeon: Meredith Pel, MD;  Location: [facility];  Service: Orthopedics;  Laterality: Left;    reports that she has never smoked. She has never used smokeless tobacco. She reports that she does not drink alcohol or use drugs. family history is not on file. Allergies  Allergen Reactions  . Atorvastatin     REACTION: INTOL to Lipitor w/ leg pain  . Penicillins     REACTION: hives   Current Outpatient Medications on File Prior to Visit  Medication Sig Dispense Refill  . amitriptyline (ELAVIL) 50 MG tablet 1-2 tab by mouth at bedtime as needed 90 tablet 1  . azithromycin (ZITHROMAX Z-PAK) 250 MG tablet 2 tab by mouth day 1, then 1 per day 6 tablet 1  . bifidobacterium infantis (ALIGN) capsule Take 1 capsule by mouth daily.    . brimonidine-timolol (COMBIGAN) 0.2-0.5 % ophthalmic solution Place 1 drop into both eyes 2 (two) times daily.    . brinzolamide (AZOPT) 1 % ophthalmic suspension Place 1 drop into both eyes 2 (two) times daily.    . clorazepate (TRANXENE) 7.5 MG tablet TAKE 1 TABLET BY MOUTH TWICE DAILY AS NEEDED FOR ANXIETY 60 tablet 5  . Coenzyme Q10 (COQ10) 100 MG CAPS Take 100 mg by mouth daily.     Marland Kitchen desonide (DESOWEN) 0.05 % ointment Apply 1 application topically 2 (two) times daily. 60 g 0  . Garlic 123XX123 MG TABS Take 100 mg by mouth daily.    . Magnesium 500 MG TABS Take 1 tablet by mouth daily.    . Melatonin 3 MG TABS 1 tab by mouth at bedtime as needed 90 tablet 3  . ondansetron (ZOFRAN) 4 MG tablet Take 1 tablet (4 mg total) by mouth every 8 (eight) hours as needed for nausea or vomiting. 20 tablet 0  . pantoprazole (  PROTONIX) 40 MG tablet TAKE 1 TABLET DAILY 90 tablet 1  . rosuvastatin (CRESTOR) 20 MG tablet Take 1 tablet (20 mg total) by mouth daily. 90 tablet 3  . traZODone (DESYREL) 50 MG tablet Take 0.5-1 tablets (25-50 mg total) by mouth at bedtime as needed for sleep. 30 tablet 5  . triamcinolone (NASACORT) 55 MCG/ACT AERO nasal inhaler Place 2 sprays into the nose daily. 1 Inhaler 12  . vitamin C (ASCORBIC ACID) 500 MG tablet Take 1,000 mg by mouth daily.     . vitamin E 400 UNIT capsule Take 400 Units by mouth daily.       No current facility-administered medications on file prior to visit.    Review of Systems Constitutional: Negative for other unusual diaphoresis,  sweats, appetite or weight changes HENT: Negative for other worsening hearing loss, ear pain, facial swelling, mouth sores or neck stiffness.   Eyes: Negative for other worsening pain, redness or other visual disturbance.  Respiratory: Negative for other stridor or swelling Cardiovascular: Negative for other palpitations or other chest pain  Gastrointestinal: Negative for worsening diarrhea or loose stools, blood in stool, distention or other pain Genitourinary: Negative for hematuria, flank pain or other change in urine volume.  Musculoskeletal: Negative for myalgias or other joint swelling.  Skin: Negative for other color change, or other wound or worsening drainage.  Neurological: Negative for other syncope or numbness. Hematological: Negative for other adenopathy or swelling Psychiatric/Behavioral: Negative for hallucinations, other worsening agitation, SI, self-injury, or new decreased concentration\All otherwise neg per pt    Objective:   Physical Exam BP 122/76   Pulse 79   Temp 97.8 F (36.6 C) (Oral)   Ht 5\' 3"  (1.6 m)   Wt 150 lb (68 kg)   SpO2 98%   BMI 26.57 kg/m  VS noted,  Constitutional: Pt is oriented to person, place, and time. Appears well-developed and well-nourished, in no significant distress and comfortable Head: Normocephalic and atraumatic  Eyes: Conjunctivae and EOM are normal. Pupils are equal, round, and reactive to light Right Ear: External ear normal without discharge Left Ear: External ear normal without discharge Nose: Nose without discharge or deformity Mouth/Throat: Oropharynx is without other ulcerations and moist  Neck: Normal range of motion. Neck supple. No JVD present. No tracheal deviation present or significant neck LA or mass Cardiovascular: Normal rate, regular rhythm, normal heart sounds and intact distal pulses.   Pulmonary/Chest: WOB normal and breath sounds without rales or wheezing  Abdominal: Soft. Bowel sounds are normal. NT. No HSM   Musculoskeletal: Normal range of motion. Exhibits no edema Lymphadenopathy: Has no other cervical adenopathy.  Neurological: Pt is alert and oriented to person, place, and time. Pt has normal reflexes. No cranial nerve deficit. Motor grossly intact, Gait intact Skin: Skin is warm and dry. No rash noted or new ulcerations Psychiatric:  Has normal mood and affect. Behavior is normal without agitation No other exam findings Lab Results  Component Value Date   WBC 8.2 12/14/2018   HGB 11.5 (L) 12/14/2018   HCT 34.7 (L) 12/14/2018   PLT 221.0 12/14/2018   GLUCOSE 86 12/14/2018   CHOL 189 12/14/2018   TRIG 97.0 12/14/2018   HDL 62.60 12/14/2018   LDLDIRECT 152.0 10/25/2015   LDLCALC 107 (H) 12/14/2018   ALT 13 12/14/2018   AST 17 12/14/2018   NA 137 12/14/2018   K 4.2 12/14/2018   CL 103 12/14/2018   CREATININE 0.89 12/14/2018   BUN 12 12/14/2018  CO2 27 12/14/2018   TSH 1.10 12/14/2018       Assessment & Plan:

## 2018-12-14 NOTE — Patient Instructions (Signed)

## 2018-12-18 ENCOUNTER — Encounter: Payer: Self-pay | Admitting: Internal Medicine

## 2018-12-18 NOTE — Assessment & Plan Note (Signed)

## 2018-12-26 ENCOUNTER — Other Ambulatory Visit: Payer: Self-pay | Admitting: Internal Medicine

## 2019-01-11 DIAGNOSIS — H35371 Puckering of macula, right eye: Secondary | ICD-10-CM | POA: Diagnosis not present

## 2019-01-11 DIAGNOSIS — H43813 Vitreous degeneration, bilateral: Secondary | ICD-10-CM | POA: Diagnosis not present

## 2019-01-11 DIAGNOSIS — H34811 Central retinal vein occlusion, right eye, with macular edema: Secondary | ICD-10-CM | POA: Diagnosis not present

## 2019-01-18 DIAGNOSIS — H401132 Primary open-angle glaucoma, bilateral, moderate stage: Secondary | ICD-10-CM | POA: Diagnosis not present

## 2019-01-20 ENCOUNTER — Other Ambulatory Visit: Payer: Self-pay | Admitting: Internal Medicine

## 2019-01-20 DIAGNOSIS — Z1231 Encounter for screening mammogram for malignant neoplasm of breast: Secondary | ICD-10-CM

## 2019-02-08 DIAGNOSIS — H43813 Vitreous degeneration, bilateral: Secondary | ICD-10-CM | POA: Diagnosis not present

## 2019-02-08 DIAGNOSIS — H35371 Puckering of macula, right eye: Secondary | ICD-10-CM | POA: Diagnosis not present

## 2019-02-08 DIAGNOSIS — H31012 Macula scars of posterior pole (postinflammatory) (post-traumatic), left eye: Secondary | ICD-10-CM | POA: Diagnosis not present

## 2019-02-08 DIAGNOSIS — H34811 Central retinal vein occlusion, right eye, with macular edema: Secondary | ICD-10-CM | POA: Diagnosis not present

## 2019-03-10 ENCOUNTER — Other Ambulatory Visit: Payer: Self-pay

## 2019-03-10 MED ORDER — AMITRIPTYLINE HCL 50 MG PO TABS: ORAL_TABLET | ORAL | 1 refills | Status: DC

## 2019-03-16 DIAGNOSIS — H43813 Vitreous degeneration, bilateral: Secondary | ICD-10-CM | POA: Diagnosis not present

## 2019-03-16 DIAGNOSIS — H35371 Puckering of macula, right eye: Secondary | ICD-10-CM | POA: Diagnosis not present

## 2019-03-16 DIAGNOSIS — H34811 Central retinal vein occlusion, right eye, with macular edema: Secondary | ICD-10-CM | POA: Diagnosis not present

## 2019-03-16 DIAGNOSIS — H31012 Macula scars of posterior pole (postinflammatory) (post-traumatic), left eye: Secondary | ICD-10-CM | POA: Diagnosis not present

## 2019-03-17 ENCOUNTER — Ambulatory Visit: Payer: Medicare PPO

## 2019-04-13 DIAGNOSIS — H35371 Puckering of macula, right eye: Secondary | ICD-10-CM | POA: Diagnosis not present

## 2019-04-13 DIAGNOSIS — H31012 Macula scars of posterior pole (postinflammatory) (post-traumatic), left eye: Secondary | ICD-10-CM | POA: Diagnosis not present

## 2019-04-13 DIAGNOSIS — H43813 Vitreous degeneration, bilateral: Secondary | ICD-10-CM | POA: Diagnosis not present

## 2019-04-13 DIAGNOSIS — H34811 Central retinal vein occlusion, right eye, with macular edema: Secondary | ICD-10-CM | POA: Diagnosis not present

## 2019-04-26 ENCOUNTER — Ambulatory Visit
Admission: RE | Admit: 2019-04-26 | Discharge: 2019-04-26 | Disposition: A | Discharge status: Home / Self Care | Payer: Medicare PPO | Source: Ambulatory Visit | Attending: Internal Medicine | Admitting: Internal Medicine

## 2019-04-26 ENCOUNTER — Ambulatory Visit: Payer: Medicare PPO

## 2019-04-26 ENCOUNTER — Other Ambulatory Visit: Payer: Self-pay

## 2019-04-26 DIAGNOSIS — Z1231 Encounter for screening mammogram for malignant neoplasm of breast: Secondary | ICD-10-CM | POA: Diagnosis not present

## 2019-05-11 DIAGNOSIS — H34811 Central retinal vein occlusion, right eye, with macular edema: Secondary | ICD-10-CM | POA: Diagnosis not present

## 2019-05-24 ENCOUNTER — Other Ambulatory Visit: Payer: Self-pay | Admitting: Internal Medicine

## 2019-06-04 ENCOUNTER — Other Ambulatory Visit: Payer: Self-pay | Admitting: Internal Medicine

## 2019-06-06 NOTE — Telephone Encounter (Signed)
Please refill as per office routine med refill policy (all routine meds refilled for 3 mo or monthly per pt preference up to one year from last visit, then month to month grace period for 3 mo, then further med refills will have to be denied)  

## 2019-06-14 DIAGNOSIS — H43813 Vitreous degeneration, bilateral: Secondary | ICD-10-CM | POA: Diagnosis not present

## 2019-06-14 DIAGNOSIS — H35371 Puckering of macula, right eye: Secondary | ICD-10-CM | POA: Diagnosis not present

## 2019-06-14 DIAGNOSIS — H31012 Macula scars of posterior pole (postinflammatory) (post-traumatic), left eye: Secondary | ICD-10-CM | POA: Diagnosis not present

## 2019-06-14 DIAGNOSIS — H34811 Central retinal vein occlusion, right eye, with macular edema: Secondary | ICD-10-CM | POA: Diagnosis not present

## 2019-06-15 ENCOUNTER — Ambulatory Visit: Payer: Medicare PPO | Admitting: Family

## 2019-06-15 DIAGNOSIS — Z0289 Encounter for other administrative examinations: Secondary | ICD-10-CM

## 2019-06-24 ENCOUNTER — Ambulatory Visit (INDEPENDENT_AMBULATORY_CARE_PROVIDER_SITE_OTHER): Payer: Medicare PPO | Admitting: Internal Medicine

## 2019-06-24 ENCOUNTER — Encounter: Payer: Self-pay | Admitting: Internal Medicine

## 2019-06-24 ENCOUNTER — Other Ambulatory Visit: Payer: Self-pay

## 2019-06-24 VITALS — BP 130/82 | HR 74 | Temp 98.3°F | Ht 63.0 in | Wt 144.0 lb

## 2019-06-24 DIAGNOSIS — E7800 Pure hypercholesterolemia, unspecified: Secondary | ICD-10-CM

## 2019-06-24 DIAGNOSIS — K219 Gastro-esophageal reflux disease without esophagitis: Secondary | ICD-10-CM

## 2019-06-24 DIAGNOSIS — Z0000 Encounter for general adult medical examination without abnormal findings: Secondary | ICD-10-CM

## 2019-06-24 DIAGNOSIS — F411 Generalized anxiety disorder: Secondary | ICD-10-CM | POA: Diagnosis not present

## 2019-06-24 DIAGNOSIS — E559 Vitamin D deficiency, unspecified: Secondary | ICD-10-CM

## 2019-06-24 DIAGNOSIS — E538 Deficiency of other specified B group vitamins: Secondary | ICD-10-CM | POA: Diagnosis not present

## 2019-06-24 DIAGNOSIS — M549 Dorsalgia, unspecified: Secondary | ICD-10-CM | POA: Diagnosis not present

## 2019-06-24 MED ORDER — ROSUVASTATIN CALCIUM 20 MG PO TABS: ORAL_TABLET | ORAL | 3 refills | Status: DC

## 2019-06-24 NOTE — Patient Instructions (Signed)
Please take your generic crestor every day, and follow low cholesterol diet  Please continue all other medications as before, and refills have been done if requested.  Please have the pharmacy call with any other refills you may need.  Please continue your efforts at being more active, and weight control.  Please keep your appointments with your specialists as you may have planned  Please make an Appointment to return in 6 months, or sooner if needed, also with Lab Appointment for testing done 3-5 days before at the [facility] (so this is for TWO appointments - please see the scheduling desk as you leave)

## 2019-06-24 NOTE — Progress Notes (Addendum)
Subjective:    Patient ID: Hannah Madden, female    DOB: Oct 31, 1934, 85 y.o.   MRN: [id number]  HPI  Here to f/u; overall doing ok,  Pt denies chest pain, increasing sob or doe, wheezing, orthopnea, PND, increased LE swelling, palpitations, dizziness or syncope.  Pt denies new neurological symptoms such as new headache, or facial or extremity weakness or numbness.  Pt denies polydipsia, polyuria, or low sugar episode.  Pt states overall good compliance with meds, mostly trying to follow appropriate diet, with wt overall stable,  but little exercise however.  Denies worsening reflux, abd pain, dysphagia, n/v, bowel change or blood.  Also had mild left upper back pain sharp pain x 2 days worse to move the left shoulder for unclear reasons, but resolved today. Past Medical History:  Diagnosis Date  . Alopecia    stress related and resolved since her Mother moved to SNF  . GERD (gastroesophageal reflux disease)    takes Nexium daily  . Glaucoma    uses eye drops daily  . H/O hiatal hernia   . Hypercholesteremia    takes Simvastatin daily  . Pneumonia 1960   Past Surgical History:  Procedure Laterality Date  . ABDOMINAL HYSTERECTOMY     fibroids.  . COLONOSCOPY    . KNEE ARTHROSCOPY WITH MEDIAL MENISECTOMY Left 09/12/2013   Procedure: KNEE ARTHROSCOPY WITH DEBRIDEMENT;  Surgeon: Meredith Pel, MD;  Location: [facility];  Service: Orthopedics;  Laterality: Left;    reports that she has never smoked. She has never used smokeless tobacco. She reports that she does not drink alcohol or use drugs. family history is not on file. Allergies  Allergen Reactions  . Atorvastatin     REACTION: INTOL to Lipitor w/ leg pain  . Penicillins     REACTION: hives   Current Outpatient Medications on File Prior to Visit  Medication Sig Dispense Refill  . aspirin 325 MG tablet aspirin 325 mg tablet  TAKE 1 TABLET BY MOUTH ONCE DAILY    . brimonidine-timolol (COMBIGAN) 0.2-0.5 % ophthalmic solution  Place 1 drop into both eyes 2 (two) times daily.    . brinzolamide (AZOPT) 1 % ophthalmic suspension Place 1 drop into both eyes 2 (two) times daily.    . Influenza Virus Vacc Split PF 0.5 ML SUSY Fluzone 2013-2014(PF) 45 mcg (15 mcg x 3)/0.5 mL intramuscular syringe  inject 0.5 milliliter intramuscularly    . pantoprazole (PROTONIX) 40 MG tablet TAKE 1 TABLET DAILY 90 tablet 3  . tobramycin (TOBREX) 0.3 % ophthalmic solution     . vitamin C (ASCORBIC ACID) 500 MG tablet Take 1,000 mg by mouth daily.     . vitamin E 400 UNIT capsule Take 400 Units by mouth daily.       No current facility-administered medications on file prior to visit.   Review of Systems All otherwise neg per pt     Objective:   Physical Exam BP 130/82 (BP Location: Left Arm, Patient Position: Sitting, Cuff Size: Large)   Pulse 74   Temp 98.3 F (36.8 C) (Oral)   Ht 5\' 3"  (1.6 m)   Wt 144 lb (65.3 kg)   SpO2 98%   BMI 25.51 kg/m  VS noted,  Constitutional: Pt appears in NAD HENT: Head: NCAT.  Right Ear: External ear normal.  Left Ear: External ear normal.  Eyes: . Pupils are equal, round, and reactive to light. Conjunctivae and EOM are normal Nose: without d/c or deformity Neck:  Neck supple. Gross normal ROM Cardiovascular: Normal rate and regular rhythm.   Pulmonary/Chest: Effort normal and breath sounds without rales or wheezing.  Abd:  Soft, NT, ND, + BS, no organomegaly Neurological: Pt is alert. At baseline orientation, motor grossly intact Skin: Skin is warm. No rashes, other new lesions, no LE edema Psychiatric: Pt behavior is normal without agitation  All otherwise neg per pt Lab Results  Component Value Date   WBC 8.2 12/14/2018   HGB 11.5 (L) 12/14/2018   HCT 34.7 (L) 12/14/2018   PLT 221.0 12/14/2018   GLUCOSE 86 12/14/2018   CHOL 189 12/14/2018   TRIG 97.0 12/14/2018   HDL 62.60 12/14/2018   LDLDIRECT 152.0 10/25/2015   LDLCALC 107 (H) 12/14/2018   ALT 13 12/14/2018   AST 17  12/14/2018   NA 137 12/14/2018   K 4.2 12/14/2018   CL 103 12/14/2018   CREATININE 0.89 12/14/2018   BUN 12 12/14/2018   CO2 27 12/14/2018   TSH 1.10 12/14/2018      Assessment & Plan:

## 2019-06-25 ENCOUNTER — Encounter: Payer: Self-pay | Admitting: Internal Medicine

## 2019-06-25 DIAGNOSIS — M549 Dorsalgia, unspecified: Secondary | ICD-10-CM | POA: Insufficient documentation

## 2019-06-25 NOTE — Assessment & Plan Note (Signed)
stable overall by history and exam, recent data reviewed with pt, and pt to continue medical treatment as before,  to f/u any worsening symptoms or concerns  

## 2019-06-25 NOTE — Assessment & Plan Note (Addendum)
Uncontrolled, encourage pt for med compliance daily, o/w stable overall by history and exam, recent data reviewed with pt, and pt to continue medical treatment as before,  to f/u any worsening symptoms or concerns

## 2019-06-25 NOTE — Assessment & Plan Note (Signed)
C.w msk strain now improved, cont tylenol prn,  to f/u any worsening symptoms or concerns

## 2019-06-30 ENCOUNTER — Other Ambulatory Visit (INDEPENDENT_AMBULATORY_CARE_PROVIDER_SITE_OTHER): Payer: Medicare PPO

## 2019-06-30 DIAGNOSIS — E559 Vitamin D deficiency, unspecified: Secondary | ICD-10-CM | POA: Diagnosis not present

## 2019-06-30 DIAGNOSIS — Z0000 Encounter for general adult medical examination without abnormal findings: Secondary | ICD-10-CM | POA: Diagnosis not present

## 2019-06-30 DIAGNOSIS — E538 Deficiency of other specified B group vitamins: Secondary | ICD-10-CM | POA: Diagnosis not present

## 2019-06-30 LAB — LIPID PANEL
Cholesterol: 174 mg/dL (ref 0–200)
HDL: 58.6 mg/dL (ref >39.00)
LDL Cholesterol: 97 mg/dL (ref 0–99)
NonHDL: 115.57
Total CHOL/HDL Ratio: 3
Triglycerides: 91 mg/dL (ref 0.0–149.0)
VLDL: 18.2 mg/dL (ref 0.0–40.0)

## 2019-06-30 LAB — BASIC METABOLIC PANEL
BUN: 13 mg/dL (ref 6–23)
CO2: 27 mEq/L (ref 19–32)
Calcium: 9.2 mg/dL (ref 8.4–10.5)
Chloride: 104 mEq/L (ref 96–112)
Creatinine, Ser: 0.98 mg/dL (ref 0.40–1.20)
GFR: 65.66 mL/min (ref >60.00)
Glucose, Bld: 107 mg/dL — ABNORMAL HIGH (ref 70–99)
Potassium: 3.8 mEq/L (ref 3.5–5.1)
Sodium: 138 mEq/L (ref 135–145)

## 2019-06-30 LAB — HEPATIC FUNCTION PANEL
ALT: 9 U/L (ref 0–35)
AST: 15 U/L (ref 0–37)
Albumin: 4.2 g/dL (ref 3.5–5.2)
Alkaline Phosphatase: 71 U/L (ref 39–117)
Bilirubin, Direct: 0.1 mg/dL (ref 0.0–0.3)
Total Bilirubin: 0.5 mg/dL (ref 0.2–1.2)
Total Protein: 6.7 g/dL (ref 6.0–8.3)

## 2019-06-30 LAB — CBC WITH DIFFERENTIAL/PLATELET
Basophils Absolute: 0 10*3/uL (ref 0.0–0.1)
Basophils Relative: 0.5 % (ref 0.0–3.0)
Eosinophils Absolute: 0.1 10*3/uL (ref 0.0–0.7)
Eosinophils Relative: 1.5 % (ref 0.0–5.0)
HCT: 34.2 % — ABNORMAL LOW (ref 36.0–46.0)
Hemoglobin: 11.3 g/dL — ABNORMAL LOW (ref 12.0–15.0)
Lymphocytes Relative: 37 % (ref 12.0–46.0)
Lymphs Abs: 2.6 10*3/uL (ref 0.7–4.0)
MCHC: 32.9 g/dL (ref 30.0–36.0)
MCV: 88.6 fl (ref 78.0–100.0)
Monocytes Absolute: 0.6 10*3/uL (ref 0.1–1.0)
Monocytes Relative: 9.2 % (ref 3.0–12.0)
Neutro Abs: 3.6 10*3/uL (ref 1.4–7.7)
Neutrophils Relative %: 51.8 % (ref 43.0–77.0)
Platelets: 132 10*3/uL — ABNORMAL LOW (ref 150.0–400.0)
RBC: 3.86 Mil/uL — ABNORMAL LOW (ref 3.87–5.11)
RDW: 14.1 % (ref 11.5–15.5)
WBC: 7 10*3/uL (ref 4.0–10.5)

## 2019-06-30 LAB — VITAMIN D 25 HYDROXY (VIT D DEFICIENCY, FRACTURES): VITD: 29.97 ng/mL — ABNORMAL LOW (ref 30.00–100.00)

## 2019-06-30 LAB — URINALYSIS, ROUTINE W REFLEX MICROSCOPIC
Bilirubin Urine: NEGATIVE
Hgb urine dipstick: NEGATIVE
Ketones, ur: NEGATIVE
Leukocytes,Ua: NEGATIVE
Nitrite: NEGATIVE
Specific Gravity, Urine: 1.02 (ref 1.000–1.030)
Total Protein, Urine: NEGATIVE
Urine Glucose: NEGATIVE
Urobilinogen, UA: 0.2 (ref 0.0–1.0)
pH: 6 (ref 5.0–8.0)

## 2019-06-30 LAB — TSH: TSH: 1.04 u[IU]/mL (ref 0.35–4.50)

## 2019-06-30 LAB — VITAMIN B12: Vitamin B-12: 224 pg/mL (ref 211–911)

## 2019-07-04 ENCOUNTER — Telehealth: Payer: Self-pay | Admitting: Internal Medicine

## 2019-07-04 NOTE — Telephone Encounter (Signed)
New Message:  Pt's grand daughter is calling and states the pt has a ear ache in her L ear and would like to know if we can send a medication over to the pharmacy. Please advise.

## 2019-07-05 NOTE — Telephone Encounter (Signed)
Sorry, I am unable as this is a new problem and pt would need OV

## 2019-07-05 NOTE — Telephone Encounter (Signed)
Pt states she is doing fine. Pt states she does not wish to make an appointment at this time. Pt was informed that if she wishes to come in she is more then welcome to call us back to schedule an appointment.

## 2019-07-07 ENCOUNTER — Ambulatory Visit: Payer: Medicare PPO | Admitting: Internal Medicine

## 2019-07-07 ENCOUNTER — Other Ambulatory Visit: Payer: Self-pay

## 2019-07-07 ENCOUNTER — Encounter: Payer: Self-pay | Admitting: Internal Medicine

## 2019-07-07 ENCOUNTER — Ambulatory Visit (INDEPENDENT_AMBULATORY_CARE_PROVIDER_SITE_OTHER): Payer: Medicare PPO | Admitting: Internal Medicine

## 2019-07-07 DIAGNOSIS — R030 Elevated blood-pressure reading, without diagnosis of hypertension: Secondary | ICD-10-CM | POA: Diagnosis not present

## 2019-07-07 DIAGNOSIS — F411 Generalized anxiety disorder: Secondary | ICD-10-CM

## 2019-07-07 DIAGNOSIS — J309 Allergic rhinitis, unspecified: Secondary | ICD-10-CM

## 2019-07-07 MED ORDER — MUCINEX 600 MG PO TB12: 1200.0000 mg | ORAL_TABLET | Freq: Two times a day (BID) | ORAL | 0 refills | Status: DC | PRN

## 2019-07-07 MED ORDER — TRIAMCINOLONE ACETONIDE 55 MCG/ACT NA AERO
2.0000 | INHALATION_SPRAY | Freq: Every day | NASAL | 12 refills | Status: DC
Start: 2019-07-07 — End: 2021-04-23

## 2019-07-07 MED ORDER — PREDNISONE 10 MG PO TABS
ORAL_TABLET | ORAL | 0 refills | Status: DC
Start: 2019-07-07 — End: 2019-08-08

## 2019-07-07 NOTE — Patient Instructions (Signed)
Please take all new medication as prescribed - the prednisone, mucinex and nasacort  Please continue all other medications as before, and refills have been done if requested.  Please have the pharmacy call with any other refills you may need.  Please keep your appointments with your specialists as you may have planned  Please make an Appointment to return in 1 months, or sooner if needed

## 2019-07-07 NOTE — Assessment & Plan Note (Addendum)
Mild to mod, for predpac asd, mucinex asd, and nasacort, to f/u any worsening symptoms or concerns  I spent 31 minutes in preparing to see the patient by review of recent labs, imaging and procedures, obtaining and reviewing separately obtained history, communicating with the patient and family or caregiver, ordering medications, tests or procedures, and documenting clinical information in the EHR including the differential Dx, treatment, and any further evaluation and other management of allergies, anxiety, elevated bp

## 2019-07-07 NOTE — Assessment & Plan Note (Signed)
stable overall by history and exam, recent data reviewed with pt, and pt to continue medical treatment as before,  to f/u any worsening symptoms or concerns  

## 2019-07-07 NOTE — Progress Notes (Addendum)
Subjective:    Patient ID: Hannah Madden, female    DOB: 02-Jun-1934, 85 y.o.   MRN: [id number]  HPI  Here to f/u; overall doing ok,  Pt denies chest pain, increasing sob or doe, wheezing, orthopnea, PND, increased LE swelling, palpitations, dizziness or syncope.  Pt denies new neurological symptoms such as new headache, or facial or extremity weakness or numbness.  Pt denies polydipsia, polyuria, or low sugar episode.  Pt states overall good compliance with meds, mostly trying to follow appropriate diet, with wt overall stable,  Had left ear discomfort 2 days ago, none since, Does have several wks ongoing nasal allergy symptoms with clearish congestion, itch and sneezing, without fever, pain, ST, cough, swelling or wheezing. Denies worsening depressive symptoms, suicidal ideation, or panic Past Medical History:  Diagnosis Date  . Alopecia    stress related and resolved since her Mother moved to SNF  . GERD (gastroesophageal reflux disease)    takes Nexium daily  . Glaucoma    uses eye drops daily  . H/O hiatal hernia   . Hypercholesteremia    takes Simvastatin daily  . Pneumonia 1960   Past Surgical History:  Procedure Laterality Date  . ABDOMINAL HYSTERECTOMY     fibroids.  . COLONOSCOPY    . KNEE ARTHROSCOPY WITH MEDIAL MENISECTOMY Left 09/12/2013   Procedure: KNEE ARTHROSCOPY WITH DEBRIDEMENT;  Surgeon: Meredith Pel, MD;  Location: [facility];  Service: Orthopedics;  Laterality: Left;    reports that she has never smoked. She has never used smokeless tobacco. She reports that she does not drink alcohol or use drugs. family history is not on file. Allergies  Allergen Reactions  . Atorvastatin     REACTION: INTOL to Lipitor w/ leg pain  . Penicillins     REACTION: hives   Current Outpatient Medications on File Prior to Visit  Medication Sig Dispense Refill  . brimonidine-timolol (COMBIGAN) 0.2-0.5 % ophthalmic solution Place 1 drop into both eyes 2 (two) times daily.    .  brinzolamide (AZOPT) 1 % ophthalmic suspension Place 1 drop into both eyes 2 (two) times daily.    . Influenza Virus Vacc Split PF 0.5 ML SUSY Fluzone 2013-2014(PF) 45 mcg (15 mcg x 3)/0.5 mL intramuscular syringe  inject 0.5 milliliter intramuscularly    . pantoprazole (PROTONIX) 40 MG tablet TAKE 1 TABLET DAILY 90 tablet 3  . rosuvastatin (CRESTOR) 20 MG tablet TAKE 1 TABLET(20 MG) BY MOUTH DAILY 90 tablet 3  . tobramycin (TOBREX) 0.3 % ophthalmic solution     . vitamin C (ASCORBIC ACID) 500 MG tablet Take 1,000 mg by mouth daily.     . vitamin E 400 UNIT capsule Take 400 Units by mouth daily.       No current facility-administered medications on file prior to visit.   Review of Systems All otherwise neg per pt     Objective:   Physical Exam BP (!) 150/82 (BP Location: Left Arm, Patient Position: Sitting, Cuff Size: Small)   Pulse 77   Temp 98.8 F (37.1 C) (Oral)   Ht 5\' 3"  (1.6 m)   Wt 144 lb (65.3 kg)   BMI 25.51 kg/m  VS noted,  Constitutional: Pt appears in NAD HENT: Head: NCAT.  Right Ear: External ear normal.  Left Ear: External ear normal.  Eyes: . Pupils are equal, round, and reactive to light. Conjunctivae and EOM are normal Nose: without d/c or deformity Neck: Neck supple. Gross normal ROM Cardiovascular: Normal rate  and regular rhythm.   Pulmonary/Chest: Effort normal and breath sounds without rales or wheezing.  Abd:  Soft, NT, ND, + BS, no organomegaly Neurological: Pt is alert. At baseline orientation, motor grossly intact Skin: Skin is warm. No rashes, other new lesions, no LE edema Psychiatric: Pt behavior is normal without agitation  All otherwise neg per pt Lab Results  Component Value Date   WBC 7.0 06/30/2019   HGB 11.3 (L) 06/30/2019   HCT 34.2 (L) 06/30/2019   PLT 132.0 (L) 06/30/2019   GLUCOSE 107 (H) 06/30/2019   CHOL 174 06/30/2019   TRIG 91.0 06/30/2019   HDL 58.60 06/30/2019   LDLDIRECT 152.0 10/25/2015   LDLCALC 97 06/30/2019   ALT 9  06/30/2019   AST 15 06/30/2019   NA 138 06/30/2019   K 3.8 06/30/2019   CL 104 06/30/2019   CREATININE 0.98 06/30/2019   BUN 13 06/30/2019   CO2 27 06/30/2019   TSH 1.04 06/30/2019      Assessment & Plan:

## 2019-07-07 NOTE — Assessment & Plan Note (Signed)
New onset, probably situational, to f/u bp at home and next visit

## 2019-07-20 ENCOUNTER — Telehealth: Payer: Self-pay | Admitting: Internal Medicine

## 2019-07-20 NOTE — Telephone Encounter (Signed)
    Please return call to patient to discuss details of visit. Patient declined to schedule appointment until she speaks with health advisor

## 2019-07-25 NOTE — Telephone Encounter (Signed)
-----   Message from Hillery Jacks sent at 07/22/2019 11:45 AM EDT ----- Regarding: Call patient Hannah Madden sent me a message asking me if I would call this patient. I have called this patient and she would like to talk to you about the AWV. Can you please reach out to her to explain the Medicare Annual wellness.  Thanks  [employer]

## 2019-08-08 ENCOUNTER — Ambulatory Visit (INDEPENDENT_AMBULATORY_CARE_PROVIDER_SITE_OTHER): Payer: Medicare PPO | Admitting: Internal Medicine

## 2019-08-08 ENCOUNTER — Encounter: Payer: Self-pay | Admitting: Internal Medicine

## 2019-08-08 ENCOUNTER — Other Ambulatory Visit: Payer: Self-pay

## 2019-08-08 VITALS — BP 160/80 | HR 78 | Temp 98.8°F | Ht 63.0 in | Wt 143.0 lb

## 2019-08-08 DIAGNOSIS — R413 Other amnesia: Secondary | ICD-10-CM | POA: Diagnosis not present

## 2019-08-08 DIAGNOSIS — J309 Allergic rhinitis, unspecified: Secondary | ICD-10-CM

## 2019-08-08 DIAGNOSIS — R739 Hyperglycemia, unspecified: Secondary | ICD-10-CM | POA: Insufficient documentation

## 2019-08-08 DIAGNOSIS — R030 Elevated blood-pressure reading, without diagnosis of hypertension: Secondary | ICD-10-CM

## 2019-08-08 MED ORDER — PREDNISONE 10 MG PO TABS: ORAL_TABLET | ORAL | 0 refills | Status: DC

## 2019-08-08 NOTE — Assessment & Plan Note (Signed)
To continue to monitor BP at home and next visit 

## 2019-08-08 NOTE — Assessment & Plan Note (Addendum)
Likely early dementia, declines aricept after states it didn't help her 83 yo mother, for MRI brain, and refer neurology  I spent 41 minutes in preparing to see the patient by review of recent labs, imaging and procedures, obtaining and reviewing separately obtained history, communicating with the patient and family or caregiver, ordering medications, tests or procedures, and documenting clinical information in the EHR including the differential Dx, treatment, and any further evaluation and other management of memory changes, alelrgies, hyperglycemia, eleveated BP, chronic lbp

## 2019-08-08 NOTE — Assessment & Plan Note (Signed)
stable overall by history and exam, recent data reviewed with pt, and pt to continue medical treatment as before,  to f/u any worsening symptoms or concerns  

## 2019-08-08 NOTE — Progress Notes (Signed)
Subjective:    Patient ID: Hannah Madden, female    DOB: [birth year], 85 y.o.   MRN: [id number]  HPI  Here with daughter with c/o worsening memory changes over the last 6 mo, still lives alone (daugher in [place]) but seems to ask the same questions several times within a few minutes more often now.  Pt continues to have recurring LBP without change in severity, bowel or bladder change, fever, wt loss,  worsening LE pain/numbness/weakness, gait change or falls.  BP at home has been < 140/90 per pt. Pt denies chest pain, increased sob or doe, wheezing, orthopnea, PND, increased LE swelling, palpitations, dizziness or syncope.   Pt denies polydipsia, polyuria.  Does have several wks ongoing nasal allergy symptoms with clearish congestion, itch and sneezing, and left sided facial sweling today, without fever, pain, ST, cough, swelling or wheezing.  Past Medical History:  Diagnosis Date  . Alopecia    stress related and resolved since her Mother moved to SNF  . GERD (gastroesophageal reflux disease)    takes Nexium daily  . Glaucoma    uses eye drops daily  . H/O hiatal hernia   . Hypercholesteremia    takes Simvastatin daily  . Pneumonia 1960   Past Surgical History:  Procedure Laterality Date  . ABDOMINAL HYSTERECTOMY     fibroids.  . COLONOSCOPY    . KNEE ARTHROSCOPY WITH MEDIAL MENISECTOMY Left 09/12/2013   Procedure: KNEE ARTHROSCOPY WITH DEBRIDEMENT;  Surgeon: Meredith Pel, MD;  Location: [facility];  Service: Orthopedics;  Laterality: Left;    reports that she has never smoked. She has never used smokeless tobacco. She reports that she does not drink alcohol or use drugs. family history is not on file. Allergies  Allergen Reactions  . Atorvastatin     REACTION: INTOL to Lipitor w/ leg pain  . Penicillins     REACTION: hives   Current Outpatient Medications on File Prior to Visit  Medication Sig Dispense Refill  . brimonidine-timolol (COMBIGAN) 0.2-0.5 % ophthalmic  solution Place 1 drop into both eyes 2 (two) times daily.    . brinzolamide (AZOPT) 1 % ophthalmic suspension Place 1 drop into both eyes 2 (two) times daily.    Marland Kitchen guaiFENesin (MUCINEX) 600 MG 12 hr tablet Take 2 tablets (1,200 mg total) by mouth 2 (two) times daily as needed. 60 tablet 0  . Influenza Virus Vacc Split PF 0.5 ML SUSY Fluzone 2013-2014(PF) 45 mcg (15 mcg x 3)/0.5 mL intramuscular syringe  inject 0.5 milliliter intramuscularly    . metoCLOPramide (REGLAN) 10 MG tablet Take 10 mg by mouth 4 (four) times daily.    . pantoprazole (PROTONIX) 40 MG tablet TAKE 1 TABLET DAILY 90 tablet 3  . rosuvastatin (CRESTOR) 20 MG tablet TAKE 1 TABLET(20 MG) BY MOUTH DAILY 90 tablet 3  . tobramycin (TOBREX) 0.3 % ophthalmic solution     . triamcinolone (NASACORT) 55 MCG/ACT AERO nasal inhaler Place 2 sprays into the nose daily. 1 Inhaler 12  . vitamin C (ASCORBIC ACID) 500 MG tablet Take 1,000 mg by mouth daily.     . vitamin E 400 UNIT capsule Take 400 Units by mouth daily.       No current facility-administered medications on file prior to visit.   Review of Systems All otherwise neg per pt     Objective:   Physical Exam BP (!) 160/80 (BP Location: Right Arm, Patient Position: Sitting, Cuff Size: Large)   Pulse 78  Temp 98.8 F (37.1 C) (Oral)   Ht 5\' 3"  (1.6 m)   Wt 143 lb (64.9 kg)   SpO2 99%   BMI 25.33 kg/m 'VS noted,  Constitutional: Pt appears in NAD HENT: Head: NCAT.  Right Ear: External ear normal.  Left Ear: External ear normal.  Eyes: . Pupils are equal, round, and reactive to light. Conjunctivae and EOM are normal Nose: without d/c or deformity Neck: Neck supple. Gross normal ROM Cardiovascular: Normal rate and regular rhythm.   Pulmonary/Chest: Effort normal and breath sounds without rales or wheezing.  Abd:  Soft, NT, ND, + BS, no organomegaly Neurological: Pt is alert. At baseline orientation, motor grossly intact Skin: Skin is warm. No rashes, other new lesions,  no LE edema Psychiatric: Pt behavior is normal without agitation  All otherwise neg per pt   Lab Results  Component Value Date   WBC 7.0 06/30/2019   HGB 11.3 (L) 06/30/2019   HCT 34.2 (L) 06/30/2019   PLT 132.0 (L) 06/30/2019   GLUCOSE 107 (H) 06/30/2019   CHOL 174 06/30/2019   TRIG 91.0 06/30/2019   HDL 58.60 06/30/2019   LDLDIRECT 152.0 10/25/2015   LDLCALC 97 06/30/2019   ALT 9 06/30/2019   AST 15 06/30/2019   NA 138 06/30/2019   K 3.8 06/30/2019   CL 104 06/30/2019   CREATININE 0.98 06/30/2019   BUN 13 06/30/2019   CO2 27 06/30/2019   TSH 1.04 06/30/2019       Assessment & Plan:

## 2019-08-08 NOTE — Assessment & Plan Note (Signed)
/  Mild to mod, for predpac asd,  to f/u any worsening symptoms or concerns 

## 2019-08-08 NOTE — Patient Instructions (Signed)
Please take all new medication as prescribed - the prednisone  Please continue all other medications as before, and refills have been done if requested.  Please have the pharmacy call with any other refills you may need.  Please continue your efforts at being more active, low cholesterol diet, and weight control.  Please keep your appointments with your specialists as you may have planned  You will be contacted regarding the referral for: MRI, and Neurology

## 2019-08-09 ENCOUNTER — Telehealth: Payer: Self-pay | Admitting: Internal Medicine

## 2019-08-09 DIAGNOSIS — H35371 Puckering of macula, right eye: Secondary | ICD-10-CM | POA: Diagnosis not present

## 2019-08-09 DIAGNOSIS — H31012 Macula scars of posterior pole (postinflammatory) (post-traumatic), left eye: Secondary | ICD-10-CM | POA: Diagnosis not present

## 2019-08-09 DIAGNOSIS — H43813 Vitreous degeneration, bilateral: Secondary | ICD-10-CM | POA: Diagnosis not present

## 2019-08-09 DIAGNOSIS — H34811 Central retinal vein occlusion, right eye, with macular edema: Secondary | ICD-10-CM | POA: Diagnosis not present

## 2019-08-09 NOTE — Telephone Encounter (Signed)
New message:    Sent Eather Colas a email regarding the patient wanting to get the " No Show" Fee waived from 06/15/19 for an appt with Jodi Mourning.

## 2019-08-10 ENCOUNTER — Encounter: Payer: Self-pay | Admitting: Internal Medicine

## 2019-08-15 ENCOUNTER — Telehealth: Payer: Self-pay | Admitting: Internal Medicine

## 2019-08-15 NOTE — Telephone Encounter (Signed)
   1.Medication Requested:pantoprazole (PROTONIX) 40 MG tablet  2. Pharmacy (Name, Street, City):[facility] [place], [place] - [address] AT [address]  3. On Med List:  yes  4. Last Visit with PCP: 08/08/19  5. Next visit date with PCP: 12/27/19   Agent: Please be advised that RX refills may take up to 3 business days. We ask that you follow-up with your pharmacy.

## 2019-08-16 ENCOUNTER — Other Ambulatory Visit: Payer: Self-pay

## 2019-08-16 MED ORDER — PANTOPRAZOLE SODIUM 40 MG PO TBEC: 40.0000 mg | DELAYED_RELEASE_TABLET | Freq: Every day | ORAL | 3 refills | Status: DC

## 2019-08-22 ENCOUNTER — Encounter: Payer: Self-pay | Admitting: Internal Medicine

## 2019-09-30 ENCOUNTER — Telehealth: Payer: Self-pay | Admitting: Internal Medicine

## 2019-09-30 NOTE — Telephone Encounter (Signed)
   Patient requesting refill on rosuvastatin (CRESTOR) 20 MG tablet Pharmacy : [facility] [place], [address] AT [address]

## 2019-10-03 ENCOUNTER — Other Ambulatory Visit: Payer: Self-pay

## 2019-10-03 MED ORDER — ROSUVASTATIN CALCIUM 20 MG PO TABS: ORAL_TABLET | ORAL | 3 refills | Status: DC

## 2019-10-04 DIAGNOSIS — H31012 Macula scars of posterior pole (postinflammatory) (post-traumatic), left eye: Secondary | ICD-10-CM | POA: Diagnosis not present

## 2019-10-04 DIAGNOSIS — H43813 Vitreous degeneration, bilateral: Secondary | ICD-10-CM | POA: Diagnosis not present

## 2019-10-04 DIAGNOSIS — H35371 Puckering of macula, right eye: Secondary | ICD-10-CM | POA: Diagnosis not present

## 2019-10-04 DIAGNOSIS — H34811 Central retinal vein occlusion, right eye, with macular edema: Secondary | ICD-10-CM | POA: Diagnosis not present

## 2019-10-12 ENCOUNTER — Ambulatory Visit: Payer: Medicare PPO | Admitting: Neurology

## 2019-11-01 NOTE — Telephone Encounter (Signed)
error 

## 2019-11-29 DIAGNOSIS — H43813 Vitreous degeneration, bilateral: Secondary | ICD-10-CM | POA: Diagnosis not present

## 2019-11-29 DIAGNOSIS — H34811 Central retinal vein occlusion, right eye, with macular edema: Secondary | ICD-10-CM | POA: Diagnosis not present

## 2019-11-29 DIAGNOSIS — H35373 Puckering of macula, bilateral: Secondary | ICD-10-CM | POA: Diagnosis not present

## 2019-11-29 DIAGNOSIS — H31012 Macula scars of posterior pole (postinflammatory) (post-traumatic), left eye: Secondary | ICD-10-CM | POA: Diagnosis not present

## 2019-12-22 ENCOUNTER — Ambulatory Visit: Payer: Medicare PPO | Admitting: Internal Medicine

## 2019-12-23 ENCOUNTER — Ambulatory Visit (INDEPENDENT_AMBULATORY_CARE_PROVIDER_SITE_OTHER): Payer: Medicare PPO | Admitting: Internal Medicine

## 2019-12-23 ENCOUNTER — Other Ambulatory Visit: Payer: Self-pay

## 2019-12-23 ENCOUNTER — Encounter: Payer: Self-pay | Admitting: Internal Medicine

## 2019-12-23 VITALS — BP 110/70 | HR 84 | Temp 98.6°F | Ht 63.0 in | Wt 134.0 lb

## 2019-12-23 DIAGNOSIS — E559 Vitamin D deficiency, unspecified: Secondary | ICD-10-CM | POA: Diagnosis not present

## 2019-12-23 DIAGNOSIS — R030 Elevated blood-pressure reading, without diagnosis of hypertension: Secondary | ICD-10-CM

## 2019-12-23 DIAGNOSIS — Z23 Encounter for immunization: Secondary | ICD-10-CM | POA: Diagnosis not present

## 2019-12-23 DIAGNOSIS — F411 Generalized anxiety disorder: Secondary | ICD-10-CM | POA: Diagnosis not present

## 2019-12-23 DIAGNOSIS — R739 Hyperglycemia, unspecified: Secondary | ICD-10-CM

## 2019-12-23 DIAGNOSIS — R413 Other amnesia: Secondary | ICD-10-CM

## 2019-12-23 MED ORDER — CITALOPRAM HYDROBROMIDE 10 MG PO TABS
10.0000 mg | ORAL_TABLET | Freq: Every day | ORAL | 3 refills | Status: DC
Start: 2019-12-23 — End: 2020-08-07

## 2019-12-23 NOTE — Patient Instructions (Addendum)
Please consider having the moderna booster shot soon  Please take OTC Vitamin D3 at 2000 units per day, indefinitely.  Please take all new medication as prescribed - the celexa 10 mg per day  Please continue all other medications as before, and refills have been done if requested.  Please have the pharmacy call with any other refills you may need.  Please continue your efforts at being more active, low cholesterol diet, and weight control  Please keep your appointments with your specialists as you may have planned  Please make an Appointment to return in 6 months, or sooner if needed

## 2019-12-23 NOTE — Progress Notes (Signed)
Subjective:    Patient ID: Hannah Madden, female    DOB: 1934-06-09, 85 y.o.   MRN: [id number]  HPI  Here with family, Here to f/u; overall doing ok,  Pt denies chest pain, increasing sob or doe, wheezing, orthopnea, PND, increased LE swelling, palpitations, dizziness or syncope.  Pt denies new neurological symptoms such as new headache, or facial or extremity weakness or numbness.  Pt denies polydipsia, polyuria, or low sugar episode.  Pt states overall good compliance with meds, mostly trying to follow appropriate diet, with wt overall stable,  but little exercise however.\No change in memory per pt but family states mild worsening.  Denies worsening depressive symptoms, suicidal ideation, or panic; has ongoing anxiety, ongoing but pt declines tx.  Has low Vit D not yet treated Past Medical History:  Diagnosis Date  . Alopecia    stress related and resolved since her Mother moved to SNF  . GERD (gastroesophageal reflux disease)    takes Nexium daily  . Glaucoma    uses eye drops daily  . H/O hiatal hernia   . Hypercholesteremia    takes Simvastatin daily  . Pneumonia 1960   Past Surgical History:  Procedure Laterality Date  . ABDOMINAL HYSTERECTOMY     fibroids.  . COLONOSCOPY    . KNEE ARTHROSCOPY WITH MEDIAL MENISECTOMY Left 09/12/2013   Procedure: KNEE ARTHROSCOPY WITH DEBRIDEMENT;  Surgeon: Meredith Pel, MD;  Location: [facility];  Service: Orthopedics;  Laterality: Left;    reports that she has never smoked. She has never used smokeless tobacco. She reports that she does not drink alcohol and does not use drugs. family history is not on file. Allergies  Allergen Reactions  . Atorvastatin     REACTION: INTOL to Lipitor w/ leg pain  . Penicillins     REACTION: hives   Current Outpatient Medications on File Prior to Visit  Medication Sig Dispense Refill  . brimonidine-timolol (COMBIGAN) 0.2-0.5 % ophthalmic solution Place 1 drop into both eyes 2 (two) times daily.    .  brinzolamide (AZOPT) 1 % ophthalmic suspension Place 1 drop into both eyes 2 (two) times daily.    Marland Kitchen guaiFENesin (MUCINEX) 600 MG 12 hr tablet Take 2 tablets (1,200 mg total) by mouth 2 (two) times daily as needed. 60 tablet 0  . Influenza Virus Vacc Split PF 0.5 ML SUSY Fluzone 2013-2014(PF) 45 mcg (15 mcg x 3)/0.5 mL intramuscular syringe  inject 0.5 milliliter intramuscularly    . metoCLOPramide (REGLAN) 10 MG tablet Take 10 mg by mouth 4 (four) times daily.    . pantoprazole (PROTONIX) 40 MG tablet Take 1 tablet (40 mg total) by mouth daily. 90 tablet 3  . rosuvastatin (CRESTOR) 20 MG tablet TAKE 1 TABLET(20 MG) BY MOUTH DAILY 90 tablet 3  . tobramycin (TOBREX) 0.3 % ophthalmic solution     . triamcinolone (NASACORT) 55 MCG/ACT AERO nasal inhaler Place 2 sprays into the nose daily. 1 Inhaler 12  . vitamin C (ASCORBIC ACID) 500 MG tablet Take 1,000 mg by mouth daily.      No current facility-administered medications on file prior to visit.   Review of Systems All otherwise neg per pt    Objective:   Physical Exam BP 110/70 (BP Location: Left Arm, Patient Position: Sitting, Cuff Size: Large)   Pulse 84   Temp 98.6 F (37 C) (Oral)   Ht 5\' 3"  (1.6 m)   Wt 134 lb (60.8 kg)   SpO2 95%  BMI 23.74 kg/m  VS noted,  Constitutional: Pt appears in NAD HENT: Head: NCAT.  Right Ear: External ear normal.  Left Ear: External ear normal.  Eyes: . Pupils are equal, round, and reactive to light. Conjunctivae and EOM are normal Nose: without d/c or deformity Neck: Neck supple. Gross normal ROM Cardiovascular: Normal rate and regular rhythm.   Pulmonary/Chest: Effort normal and breath sounds without rales or wheezing.  Abd:  Soft, NT, ND, + BS, no organomegaly Neurological: Pt is alert. At baseline orientation, motor grossly intact Skin: Skin is warm. No rashes, other new lesions, no LE edema Psychiatric: Pt behavior is normal without agitation , 2+ nervous All otherwise neg per pt Lab  Results  Component Value Date   WBC 7.0 06/30/2019   HGB 11.3 (L) 06/30/2019   HCT 34.2 (L) 06/30/2019   PLT 132.0 (L) 06/30/2019   GLUCOSE 107 (H) 06/30/2019   CHOL 174 06/30/2019   TRIG 91.0 06/30/2019   HDL 58.60 06/30/2019   LDLDIRECT 152.0 10/25/2015   LDLCALC 97 06/30/2019   ALT 9 06/30/2019   AST 15 06/30/2019   NA 138 06/30/2019   K 3.8 06/30/2019   CL 104 06/30/2019   CREATININE 0.98 06/30/2019   BUN 13 06/30/2019   CO2 27 06/30/2019   TSH 1.04 06/30/2019      Assessment & Plan:

## 2019-12-24 ENCOUNTER — Encounter: Payer: Self-pay | Admitting: Internal Medicine

## 2019-12-24 DIAGNOSIS — E559 Vitamin D deficiency, unspecified: Secondary | ICD-10-CM

## 2019-12-24 HISTORY — DX: Vitamin D deficiency, unspecified: E55.9

## 2019-12-24 NOTE — Assessment & Plan Note (Signed)
stable overall by history and exam, recent data reviewed with pt, and pt to continue medical treatment as before,  to f/u any worsening symptoms or concerns  

## 2019-12-24 NOTE — Assessment & Plan Note (Addendum)
Pt continues to declines mri and/or neurology or tx  I spent 41 minutes in preparing to see the patient by review of recent labs, imaging and procedures, obtaining and reviewing separately obtained history, communicating with the patient and family or caregiver, ordering medications, tests or procedures, and documenting clinical information in the EHR including the differential Dx, treatment, and any further evaluation and other management of memory changes, hyperglycemia, elev bp, anxiety, vit d def

## 2019-12-24 NOTE — Assessment & Plan Note (Signed)
Mild persistent, declines change in tx

## 2019-12-24 NOTE — Assessment & Plan Note (Signed)
To start oral replacement

## 2019-12-27 ENCOUNTER — Ambulatory Visit: Payer: Medicare PPO | Admitting: Internal Medicine

## 2020-01-17 DIAGNOSIS — H43813 Vitreous degeneration, bilateral: Secondary | ICD-10-CM | POA: Diagnosis not present

## 2020-01-17 DIAGNOSIS — H34811 Central retinal vein occlusion, right eye, with macular edema: Secondary | ICD-10-CM | POA: Diagnosis not present

## 2020-01-17 DIAGNOSIS — H35371 Puckering of macula, right eye: Secondary | ICD-10-CM | POA: Diagnosis not present

## 2020-01-17 DIAGNOSIS — H31012 Macula scars of posterior pole (postinflammatory) (post-traumatic), left eye: Secondary | ICD-10-CM | POA: Diagnosis not present

## 2020-02-28 ENCOUNTER — Other Ambulatory Visit: Payer: Self-pay | Admitting: Internal Medicine

## 2020-02-29 ENCOUNTER — Other Ambulatory Visit: Payer: Self-pay | Admitting: Internal Medicine

## 2020-03-01 ENCOUNTER — Telehealth: Payer: Self-pay | Admitting: Internal Medicine

## 2020-03-01 NOTE — Telephone Encounter (Signed)
1.Medication Requested: metoCLOPramide (REGLAN) 10 MG tablet    2. Pharmacy (Name, Street, [place]):  [facility] [phone] - [place], [place] - [address] AT [facility] [address] [facility] [address]  3. On Med List: yes   4. Last Visit with PCP: 10.22.21  5. Next visit date with PCP: 4.22.22   Agent: Please be advised that RX refills may take up to 3 business days. We ask that you follow-up with your pharmacy.

## 2020-03-05 ENCOUNTER — Other Ambulatory Visit: Payer: Self-pay

## 2020-03-05 MED ORDER — METOCLOPRAMIDE HCL 10 MG PO TABS: 10.0000 mg | ORAL_TABLET | Freq: Four times a day (QID) | ORAL | 2 refills | Status: DC

## 2020-03-06 DIAGNOSIS — H34811 Central retinal vein occlusion, right eye, with macular edema: Secondary | ICD-10-CM | POA: Diagnosis not present

## 2020-03-06 DIAGNOSIS — H31012 Macula scars of posterior pole (postinflammatory) (post-traumatic), left eye: Secondary | ICD-10-CM | POA: Diagnosis not present

## 2020-03-06 DIAGNOSIS — H43813 Vitreous degeneration, bilateral: Secondary | ICD-10-CM | POA: Diagnosis not present

## 2020-03-06 DIAGNOSIS — H35371 Puckering of macula, right eye: Secondary | ICD-10-CM | POA: Diagnosis not present

## 2020-03-29 ENCOUNTER — Telehealth: Payer: Self-pay | Admitting: Internal Medicine

## 2020-03-29 NOTE — Telephone Encounter (Signed)
tobramycin (TOBREX) 0.3 % ophthalmic solution [facility] #[id number] [address], [place] AT [address] Phone:  [phone]  Fax:  [phone]     Last seen- 10.22.21 Next apt -04.22.22 Patient requesting a refill she is completely out.

## 2020-03-30 ENCOUNTER — Telehealth: Payer: Self-pay

## 2020-03-30 NOTE — Telephone Encounter (Signed)
Notified patient via voicemail that eye drop refill is not applicable without updated visit.

## 2020-03-30 NOTE — Telephone Encounter (Signed)
This is an antiibotic eye drop, and not usually a refilable medicatioin, and I am not sure why she needs it, so she would need to say why, or have an OV, or televisit on Sat clinic tomorrow to determine if this is really needed

## 2020-04-25 DIAGNOSIS — H34811 Central retinal vein occlusion, right eye, with macular edema: Secondary | ICD-10-CM | POA: Diagnosis not present

## 2020-04-25 DIAGNOSIS — H35373 Puckering of macula, bilateral: Secondary | ICD-10-CM | POA: Diagnosis not present

## 2020-04-25 DIAGNOSIS — H31012 Macula scars of posterior pole (postinflammatory) (post-traumatic), left eye: Secondary | ICD-10-CM | POA: Diagnosis not present

## 2020-04-25 DIAGNOSIS — H43813 Vitreous degeneration, bilateral: Secondary | ICD-10-CM | POA: Diagnosis not present

## 2020-06-01 DIAGNOSIS — H43813 Vitreous degeneration, bilateral: Secondary | ICD-10-CM | POA: Diagnosis not present

## 2020-06-01 DIAGNOSIS — H34811 Central retinal vein occlusion, right eye, with macular edema: Secondary | ICD-10-CM | POA: Diagnosis not present

## 2020-06-01 DIAGNOSIS — H35371 Puckering of macula, right eye: Secondary | ICD-10-CM | POA: Diagnosis not present

## 2020-06-01 DIAGNOSIS — H31012 Macula scars of posterior pole (postinflammatory) (post-traumatic), left eye: Secondary | ICD-10-CM | POA: Diagnosis not present

## 2020-06-14 DIAGNOSIS — H401134 Primary open-angle glaucoma, bilateral, indeterminate stage: Secondary | ICD-10-CM | POA: Diagnosis not present

## 2020-06-14 DIAGNOSIS — H401130 Primary open-angle glaucoma, bilateral, stage unspecified: Secondary | ICD-10-CM | POA: Diagnosis not present

## 2020-06-22 ENCOUNTER — Ambulatory Visit: Payer: Medicare PPO | Admitting: Internal Medicine

## 2020-07-04 DIAGNOSIS — H43813 Vitreous degeneration, bilateral: Secondary | ICD-10-CM | POA: Diagnosis not present

## 2020-07-04 DIAGNOSIS — H31012 Macula scars of posterior pole (postinflammatory) (post-traumatic), left eye: Secondary | ICD-10-CM | POA: Diagnosis not present

## 2020-07-04 DIAGNOSIS — H35371 Puckering of macula, right eye: Secondary | ICD-10-CM | POA: Diagnosis not present

## 2020-07-04 DIAGNOSIS — H34811 Central retinal vein occlusion, right eye, with macular edema: Secondary | ICD-10-CM | POA: Diagnosis not present

## 2020-07-09 DIAGNOSIS — R82998 Other abnormal findings in urine: Secondary | ICD-10-CM | POA: Diagnosis not present

## 2020-07-09 DIAGNOSIS — A499 Bacterial infection, unspecified: Secondary | ICD-10-CM | POA: Diagnosis not present

## 2020-07-09 DIAGNOSIS — R350 Frequency of micturition: Secondary | ICD-10-CM | POA: Diagnosis not present

## 2020-07-09 DIAGNOSIS — N390 Urinary tract infection, site not specified: Secondary | ICD-10-CM | POA: Diagnosis not present

## 2020-07-10 ENCOUNTER — Ambulatory Visit: Payer: Medicare PPO | Admitting: Dermatology

## 2020-08-01 ENCOUNTER — Ambulatory Visit: Payer: Medicare PPO | Admitting: Internal Medicine

## 2020-08-01 ENCOUNTER — Telehealth: Payer: Self-pay | Admitting: Internal Medicine

## 2020-08-01 NOTE — Telephone Encounter (Signed)
Patient's daughter Clarene Critchley states her mother is having some muscle mass issues. She is wanting to discuss the process of transitioning into geriatric care and if she is at point yet.   Please advise and call back at [phone]

## 2020-08-01 NOTE — Telephone Encounter (Signed)
Dr. Jenny Reichmann pt.. forwarding to his assistant.Marland KitchenJohny Madden

## 2020-08-02 NOTE — Telephone Encounter (Signed)
Patient daughter was contacted and notified that we do not have consent for her to interact on her mothers behalf. Patient states she will take care of it on patients next visit.

## 2020-08-02 NOTE — Telephone Encounter (Signed)
Yes, I can try to refer to Geriatric medicine consult if they like, thanks

## 2020-08-03 ENCOUNTER — Ambulatory Visit: Payer: Self-pay

## 2020-08-07 ENCOUNTER — Encounter: Payer: Self-pay | Admitting: Internal Medicine

## 2020-08-07 ENCOUNTER — Other Ambulatory Visit (HOSPITAL_BASED_OUTPATIENT_CLINIC_OR_DEPARTMENT_OTHER): Payer: Self-pay

## 2020-08-07 ENCOUNTER — Ambulatory Visit: Payer: Self-pay | Attending: Internal Medicine

## 2020-08-07 ENCOUNTER — Ambulatory Visit (INDEPENDENT_AMBULATORY_CARE_PROVIDER_SITE_OTHER): Payer: Medicare PPO | Admitting: Internal Medicine

## 2020-08-07 ENCOUNTER — Other Ambulatory Visit: Payer: Self-pay

## 2020-08-07 VITALS — BP 160/88 | HR 97 | Temp 99.0°F | Ht 63.0 in | Wt 124.0 lb

## 2020-08-07 DIAGNOSIS — K219 Gastro-esophageal reflux disease without esophagitis: Secondary | ICD-10-CM | POA: Diagnosis not present

## 2020-08-07 DIAGNOSIS — Z0001 Encounter for general adult medical examination with abnormal findings: Secondary | ICD-10-CM

## 2020-08-07 DIAGNOSIS — R350 Frequency of micturition: Secondary | ICD-10-CM | POA: Diagnosis not present

## 2020-08-07 DIAGNOSIS — F411 Generalized anxiety disorder: Secondary | ICD-10-CM

## 2020-08-07 DIAGNOSIS — Z23 Encounter for immunization: Secondary | ICD-10-CM

## 2020-08-07 DIAGNOSIS — R739 Hyperglycemia, unspecified: Secondary | ICD-10-CM | POA: Diagnosis not present

## 2020-08-07 DIAGNOSIS — R197 Diarrhea, unspecified: Secondary | ICD-10-CM | POA: Insufficient documentation

## 2020-08-07 DIAGNOSIS — R413 Other amnesia: Secondary | ICD-10-CM

## 2020-08-07 DIAGNOSIS — E7800 Pure hypercholesterolemia, unspecified: Secondary | ICD-10-CM

## 2020-08-07 DIAGNOSIS — E559 Vitamin D deficiency, unspecified: Secondary | ICD-10-CM

## 2020-08-07 LAB — BASIC METABOLIC PANEL
BUN: 15 mg/dL (ref 6–23)
CO2: 23 mEq/L (ref 19–32)
Calcium: 9.7 mg/dL (ref 8.4–10.5)
Chloride: 104 mEq/L (ref 96–112)
Creatinine, Ser: 0.89 mg/dL (ref 0.40–1.20)
GFR: 59.87 mL/min — ABNORMAL LOW (ref >60.00)
Glucose, Bld: 86 mg/dL (ref 70–99)
Potassium: 4.3 mEq/L (ref 3.5–5.1)
Sodium: 140 mEq/L (ref 135–145)

## 2020-08-07 LAB — CBC WITH DIFFERENTIAL/PLATELET
Basophils Absolute: 0.1 10*3/uL (ref 0.0–0.1)
Basophils Relative: 0.9 % (ref 0.0–3.0)
Eosinophils Absolute: 0 10*3/uL (ref 0.0–0.7)
Eosinophils Relative: 0.6 % (ref 0.0–5.0)
HCT: 36.6 % (ref 36.0–46.0)
Hemoglobin: 12.2 g/dL (ref 12.0–15.0)
Lymphocytes Relative: 37.8 % (ref 12.0–46.0)
Lymphs Abs: 3.1 10*3/uL (ref 0.7–4.0)
MCHC: 33.3 g/dL (ref 30.0–36.0)
MCV: 87.9 fl (ref 78.0–100.0)
Monocytes Absolute: 0.6 10*3/uL (ref 0.1–1.0)
Monocytes Relative: 7.8 % (ref 3.0–12.0)
Neutro Abs: 4.3 10*3/uL (ref 1.4–7.7)
Neutrophils Relative %: 52.9 % (ref 43.0–77.0)
Platelets: 221 10*3/uL (ref 150.0–400.0)
RBC: 4.16 Mil/uL (ref 3.87–5.11)
RDW: 13.5 % (ref 11.5–15.5)
WBC: 8.1 10*3/uL (ref 4.0–10.5)

## 2020-08-07 LAB — TSH: TSH: 1.36 u[IU]/mL (ref 0.35–4.50)

## 2020-08-07 LAB — LIPID PANEL
Cholesterol: 263 mg/dL — ABNORMAL HIGH (ref 0–200)
HDL: 75.9 mg/dL (ref >39.00)
LDL Cholesterol: 161 mg/dL — ABNORMAL HIGH (ref 0–99)
NonHDL: 187.54
Total CHOL/HDL Ratio: 3
Triglycerides: 134 mg/dL (ref 0.0–149.0)
VLDL: 26.8 mg/dL (ref 0.0–40.0)

## 2020-08-07 LAB — HEPATIC FUNCTION PANEL
ALT: 8 U/L (ref 0–35)
AST: 15 U/L (ref 0–37)
Albumin: 4.4 g/dL (ref 3.5–5.2)
Alkaline Phosphatase: 69 U/L (ref 39–117)
Bilirubin, Direct: 0.1 mg/dL (ref 0.0–0.3)
Total Bilirubin: 0.5 mg/dL (ref 0.2–1.2)
Total Protein: 7.3 g/dL (ref 6.0–8.3)

## 2020-08-07 MED ORDER — PANTOPRAZOLE SODIUM 40 MG PO TBEC: 40.0000 mg | DELAYED_RELEASE_TABLET | Freq: Every day | ORAL | 3 refills | Status: DC

## 2020-08-07 MED ORDER — ROSUVASTATIN CALCIUM 20 MG PO TABS: ORAL_TABLET | ORAL | 3 refills | Status: DC

## 2020-08-07 MED ORDER — PFIZER-BIONT COVID-19 VAC-TRIS 30 MCG/0.3ML IM SUSP
INTRAMUSCULAR | 0 refills | Status: DC
  Filled 2020-08-07: qty 0.3, 1d supply, fill #0

## 2020-08-07 MED ORDER — CITALOPRAM HYDROBROMIDE 10 MG PO TABS: 10.0000 mg | ORAL_TABLET | Freq: Every day | ORAL | 3 refills | Status: DC

## 2020-08-07 NOTE — Assessment & Plan Note (Signed)
Last vitamin D Lab Results  Component Value Date   VD25OH 29.97 (L) 06/30/2019   Low, to start oral replacement

## 2020-08-07 NOTE — Assessment & Plan Note (Signed)
For celexa restart, declines other need for med change or referral

## 2020-08-07 NOTE — Patient Instructions (Signed)
Please continue all other medications as before, and refills have been done if requested - citalopram, crestor, and protonix  Please have the pharmacy call with any other refills you may need.  Please continue your efforts at being more active, low cholesterol diet, and weight control.  You are otherwise up to date with prevention measures today.  Please keep your appointments with your specialists as you may have planned  You will be contacted regarding the referral for: Gastroenterology  Please go to the LAB at the blood drawing area for the tests to be done  You will be contacted by phone if any changes need to be made immediately.  Otherwise, you will receive a letter about your results with an explanation, but please check with MyChart first.  Please remember to sign up for MyChart if you have not done so, as this will be important to you in the future with finding out test results, communicating by private email, and scheduling acute appointments online when needed.

## 2020-08-07 NOTE — Assessment & Plan Note (Signed)
Age and sex appropriate education and counseling updated with regular exercise and diet Referrals for preventative services - declines dxa for now Immunizations addressed - decliens shingrx, pneumovax Smoking counseling  - none needed Evidence for depression or other mood disorder - ongoing anxiety, for celexa start Most recent labs reviewed. I have personally reviewed and have noted: 1) the patient's medical and social history 2) The patient's current medications and supplements 3) The patient's height, weight, and BMI have been recorded in the chart

## 2020-08-07 NOTE — Progress Notes (Signed)
   Covid-19 Vaccination Clinic  Name:  QUINETTE HENTGES    MRN: [id number] DOB: March 20, 1934  08/07/2020  Ms. Lizardo was observed post Covid-19 immunization for 15 minutes without incident. She was provided with Vaccine Information Sheet and instruction to access the V-Safe system.   Ms. Dixson was instructed to call 911 with any severe reactions post vaccine: Marland Kitchen Difficulty breathing  . Swelling of face and throat  . A fast heartbeat  . A bad rash all over body  . Dizziness and weakness   Immunizations Administered    Name Date Dose VIS Date Route   PFIZER Comrnaty(Gray TOP) Covid-19 Vaccine 08/07/2020 12:54 PM 0.3 mL 02/09/2020 Intramuscular   Manufacturer: [place]   Lot: [id number]   [place]: [phone]

## 2020-08-07 NOTE — Assessment & Plan Note (Signed)
C/w early dementia, pt denies MRI , neurology or aricept

## 2020-08-07 NOTE — Progress Notes (Signed)
Patient ID: APREL EGELHOFF, female   DOB: [birth year], 86 y.o.   MRN: [id number]         Chief Complaint:: wellness exam and GI Problem (Patient visiting with Daughter in office wanting to follow up to make sure recent stomach issues checked by GI/)  and urinary frequency, memory changes, anxiety, hld       HPI:  Hannah Madden is a 86 y.o. female here for wellness exam; declines shingrix, dxa, and pneumovax for now, o/w up to date with preventive referrals and immunizations                        Also c/o 3 mo mild intermittent urinary frequency, and Denies urinary symptoms such as dysuria,  urgency, flank pain, hematuria or n/v, fever, chills. Did have recent UTI now resoled.  Denies worsening abd pain, dysphagia, n/v, bowel change or blood, except for mild worsening intermittent reflux and occaional loose stools   Pt denies fever, wt loss, night sweats, loss of appetite, or other constitutional symptoms  Denies worsening depressive symptoms, suicidal ideation, or panic; has ongoing anxiety, not taking the celexa. Also not taking the PPI.  Also not taking the crestor, but with daughter here now states she is willing to restart. Has had 6-12 mo gradaully worsening memory changes , mostly short term which she denies.   Pt denies polydipsia, polyuria, or other new focal neuro s/s.   Pt denies fever, wt loss, night sweats, loss of appetite, or other constitutional symptoms, though has lost 10 lbs recently  BP at home has been < 140/90  Wt Readings from Last 3 Encounters:  08/07/20 124 lb (56.2 kg)  12/23/19 134 lb (60.8 kg)  08/08/19 143 lb (64.9 kg)   BP Readings from Last 3 Encounters:  08/07/20 (!) 160/88  12/23/19 110/70  08/08/19 (!) 160/80   Immunization History  Administered Date(s) Administered  . Fluad Quad(high Dose 65+) 12/23/2019  . Influenza, High Dose Seasonal PF 01/10/2014, 02/14/2016, 01/23/2017, 11/05/2018  . Influenza,inj,Quad PF,6+ Mos 12/07/2014  . Influenza-Unspecified  12/02/2015  . Moderna Sars-Covid-2 Vaccination 05/02/2019, 06/01/2019  . PFIZER Comirnaty(Gray Top)Covid-19 Tri-Sucrose Vaccine 08/07/2020  . PFIZER(Purple Top)SARS-COV-2 Vaccination 02/04/2020  . Td 07/14/2008  . Tdap 09/08/2017   There are no preventive care reminders to display for this patient.    Past Medical History:  Diagnosis Date  . Alopecia    stress related and resolved since her Mother moved to SNF  . GERD (gastroesophageal reflux disease)    takes Nexium daily  . Glaucoma    uses eye drops daily  . H/O hiatal hernia   . Hypercholesteremia    takes Simvastatin daily  . Pneumonia 1960   Past Surgical History:  Procedure Laterality Date  . ABDOMINAL HYSTERECTOMY     fibroids.  . COLONOSCOPY    . KNEE ARTHROSCOPY WITH MEDIAL MENISECTOMY Left 09/12/2013   Procedure: KNEE ARTHROSCOPY WITH DEBRIDEMENT;  Surgeon: Meredith Pel, MD;  Location: [facility];  Service: Orthopedics;  Laterality: Left;    reports that she has never smoked. She has never used smokeless tobacco. She reports that she does not drink alcohol and does not use drugs. family history is not on file. Allergies  Allergen Reactions  . Atorvastatin     REACTION: INTOL to Lipitor w/ leg pain  . Penicillins     REACTION: hives   Current Outpatient Medications on File Prior to Visit  Medication Sig Dispense Refill  .  brimonidine-timolol (COMBIGAN) 0.2-0.5 % ophthalmic solution Place 1 drop into both eyes 2 (two) times daily.    . brinzolamide (AZOPT) 1 % ophthalmic suspension Place 1 drop into both eyes 2 (two) times daily.    Marland Kitchen COVID-19 mRNA vaccine, Pfizer, 30 MCG/0.3ML injection     . dorzolamide (TRUSOPT) 2 % ophthalmic solution     . guaiFENesin (MUCINEX) 600 MG 12 hr tablet Take 2 tablets (1,200 mg total) by mouth 2 (two) times daily as needed. 60 tablet 0  . Influenza Virus Vacc Split PF 0.5 ML SUSY Fluzone 2013-2014(PF) 45 mcg (15 mcg x 3)/0.5 mL intramuscular syringe  inject 0.5 milliliter  intramuscularly    . metoCLOPramide (REGLAN) 10 MG tablet Take 1 tablet (10 mg total) by mouth 4 (four) times daily. 60 tablet 2  . tobramycin (TOBREX) 0.3 % ophthalmic solution     . triamcinolone (NASACORT) 55 MCG/ACT AERO nasal inhaler Place 2 sprays into the nose daily. 1 Inhaler 12  . vitamin C (ASCORBIC ACID) 500 MG tablet Take 1,000 mg by mouth daily.     No current facility-administered medications on file prior to visit.        ROS:  All others reviewed and negative.  Objective        PE:  BP (!) 160/88 (BP Location: Left Arm, Patient Position: Sitting, Cuff Size: Normal)   Pulse 97   Temp 99 F (37.2 C) (Oral)   Ht 5\' 3"  (1.6 m)   Wt 124 lb (56.2 kg)   SpO2 99%   BMI 21.97 kg/m                 Constitutional: Pt appears in NAD               HENT: Head: NCAT.                Right Ear: External ear normal.                 Left Ear: External ear normal.                Eyes: . Pupils are equal, round, and reactive to light. Conjunctivae and EOM are normal               Nose: without d/c or deformity               Neck: Neck supple. Gross normal ROM               Cardiovascular: Normal rate and regular rhythm.                 Pulmonary/Chest: Effort normal and breath sounds without rales or wheezing.                Abd:  Soft, NT, ND, + BS, no organomegaly               Neurological: Pt is alert. At baseline orientation, motor grossly intact               Skin: Skin is warm. No rashes, no other new lesions, LE edema - none               Psychiatric: Pt behavior is normal without agitation but argumentative with daughter   Micro: none  Cardiac tracings I have personally interpreted today:  none  Pertinent Radiological findings (summarize): none   Lab Results  Component Value Date   WBC 7.0 06/30/2019   HGB  11.3 (L) 06/30/2019   HCT 34.2 (L) 06/30/2019   PLT 132.0 (L) 06/30/2019   GLUCOSE 107 (H) 06/30/2019   CHOL 174 06/30/2019   TRIG 91.0 06/30/2019   HDL 58.60  06/30/2019   LDLDIRECT 152.0 10/25/2015   LDLCALC 97 06/30/2019   ALT 9 06/30/2019   AST 15 06/30/2019   NA 138 06/30/2019   K 3.8 06/30/2019   CL 104 06/30/2019   CREATININE 0.98 06/30/2019   BUN 13 06/30/2019   CO2 27 06/30/2019   TSH 1.04 06/30/2019   Assessment/Plan:  EGYPT WELCOME is a 86 y.o. Black or African American [2] female with  has a past medical history of Alopecia, GERD (gastroesophageal reflux disease), Glaucoma, H/O hiatal hernia, Hypercholesteremia, and Pneumonia (1960).  Encounter for well adult exam with abnormal findings Age and sex appropriate education and counseling updated with regular exercise and diet Referrals for preventative services - declines dxa for now Immunizations addressed - decliens shingrx, pneumovax Smoking counseling  - none needed Evidence for depression or other mood disorder - ongoing anxiety, for celexa start Most recent labs reviewed. I have personally reviewed and have noted: 1) the patient's medical and social history 2) The patient's current medications and supplements 3) The patient's height, weight, and BMI have been recorded in the chart   HYPERCHOLESTEROLEMIA For lower chol diet, to restart statin crestor Lab Results  Component Value Date   LDLCALC 97 06/30/2019     Anxiety state For celexa restart, declines other need for med change or referral  Memory changes C/w early dementia, pt denies MRI , neurology or aricept  Hyperglycemia No results found for: HGBA1C Stable, pt to continue current medical treatment  - diet   Vitamin D deficiency Last vitamin D Lab Results  Component Value Date   VD25OH 29.97 (L) 06/30/2019   Low, to start oral replacement  Diarrhea Etiology unclear, for labs as ordered today, and daughter requests GI referral  Urinary frequency Also for urine cx, consider OAB med trial but declines tx today  Followup: Return if symptoms worsen or fail to improve.  Cathlean Cower, MD 08/07/2020  9:57 PM [facility]

## 2020-08-07 NOTE — Assessment & Plan Note (Signed)
Etiology unclear, for labs as ordered today, and daughter requests GI referral

## 2020-08-07 NOTE — Assessment & Plan Note (Signed)
For lower chol diet, to restart statin crestor Lab Results  Component Value Date   LDLCALC 97 06/30/2019

## 2020-08-07 NOTE — Assessment & Plan Note (Signed)
Also for urine cx, consider OAB med trial but declines tx today

## 2020-08-07 NOTE — Assessment & Plan Note (Signed)
No results found for: HGBA1C Stable, pt to continue current medical treatment  - diet  

## 2020-08-08 ENCOUNTER — Encounter: Payer: Self-pay | Admitting: Internal Medicine

## 2020-08-08 ENCOUNTER — Other Ambulatory Visit: Payer: Self-pay | Admitting: Internal Medicine

## 2020-08-08 LAB — URINALYSIS, ROUTINE W REFLEX MICROSCOPIC
Bilirubin Urine: NEGATIVE
Hgb urine dipstick: NEGATIVE
Ketones, ur: NEGATIVE
Leukocytes,Ua: NEGATIVE
Nitrite: NEGATIVE
Specific Gravity, Urine: 1.01 (ref 1.000–1.030)
Total Protein, Urine: NEGATIVE
Urine Glucose: NEGATIVE
Urobilinogen, UA: 0.2 (ref 0.0–1.0)
pH: 6 (ref 5.0–8.0)

## 2020-08-08 LAB — HEMOGLOBIN A1C: Hgb A1c MFr Bld: 5.6 % (ref 4.6–6.5)

## 2020-08-08 LAB — VITAMIN B12: Vitamin B-12: 166 pg/mL — ABNORMAL LOW (ref 211–911)

## 2020-08-08 LAB — VITAMIN D 25 HYDROXY (VIT D DEFICIENCY, FRACTURES): VITD: 25.13 ng/mL — ABNORMAL LOW (ref 30.00–100.00)

## 2020-08-08 MED ORDER — THERA-D 2000 50 MCG (2000 UT) PO TABS: ORAL_TABLET | ORAL | 99 refills | Status: DC

## 2020-08-08 MED ORDER — VITAMIN B-12 1000 MCG PO TABS: 1000.0000 ug | ORAL_TABLET | Freq: Every day | ORAL | 3 refills | Status: DC

## 2020-08-10 DIAGNOSIS — H31012 Macula scars of posterior pole (postinflammatory) (post-traumatic), left eye: Secondary | ICD-10-CM | POA: Diagnosis not present

## 2020-08-10 DIAGNOSIS — H34811 Central retinal vein occlusion, right eye, with macular edema: Secondary | ICD-10-CM | POA: Diagnosis not present

## 2020-08-10 DIAGNOSIS — H43813 Vitreous degeneration, bilateral: Secondary | ICD-10-CM | POA: Diagnosis not present

## 2020-08-10 DIAGNOSIS — H35371 Puckering of macula, right eye: Secondary | ICD-10-CM | POA: Diagnosis not present

## 2020-08-11 ENCOUNTER — Other Ambulatory Visit: Payer: Self-pay

## 2020-08-11 ENCOUNTER — Emergency Department (HOSPITAL_BASED_OUTPATIENT_CLINIC_OR_DEPARTMENT_OTHER)
Admission: EM | Admit: 2020-08-11 | Discharge: 2020-08-12 | Disposition: A | Discharge status: Home / Self Care | Payer: Medicare PPO | Attending: Emergency Medicine | Admitting: Emergency Medicine

## 2020-08-11 ENCOUNTER — Encounter (HOSPITAL_BASED_OUTPATIENT_CLINIC_OR_DEPARTMENT_OTHER): Payer: Self-pay | Admitting: Emergency Medicine

## 2020-08-11 ENCOUNTER — Emergency Department (HOSPITAL_BASED_OUTPATIENT_CLINIC_OR_DEPARTMENT_OTHER): Payer: Medicare PPO

## 2020-08-11 DIAGNOSIS — R350 Frequency of micturition: Secondary | ICD-10-CM | POA: Diagnosis not present

## 2020-08-11 DIAGNOSIS — R399 Unspecified symptoms and signs involving the genitourinary system: Secondary | ICD-10-CM | POA: Diagnosis not present

## 2020-08-11 DIAGNOSIS — S0990XA Unspecified injury of head, initial encounter: Secondary | ICD-10-CM | POA: Diagnosis not present

## 2020-08-11 DIAGNOSIS — M5030 Other cervical disc degeneration, unspecified cervical region: Secondary | ICD-10-CM | POA: Diagnosis not present

## 2020-08-11 DIAGNOSIS — Z043 Encounter for examination and observation following other accident: Secondary | ICD-10-CM | POA: Diagnosis not present

## 2020-08-11 DIAGNOSIS — R42 Dizziness and giddiness: Secondary | ICD-10-CM | POA: Diagnosis not present

## 2020-08-11 DIAGNOSIS — R55 Syncope and collapse: Secondary | ICD-10-CM | POA: Diagnosis not present

## 2020-08-11 LAB — BASIC METABOLIC PANEL
Anion gap: 9 (ref 5–15)
BUN: 13 mg/dL (ref 8–23)
CO2: 25 mmol/L (ref 22–32)
Calcium: 9 mg/dL (ref 8.9–10.3)
Chloride: 101 mmol/L (ref 98–111)
Creatinine, Ser: 0.87 mg/dL (ref 0.44–1.00)
GFR, Estimated: >60 mL/min (ref >60)
Glucose, Bld: 99 mg/dL (ref 70–99)
Potassium: 4 mmol/L (ref 3.5–5.1)
Sodium: 135 mmol/L (ref 135–145)

## 2020-08-11 LAB — CBC
HCT: 34.9 % — ABNORMAL LOW (ref 36.0–46.0)
Hemoglobin: 11.4 g/dL — ABNORMAL LOW (ref 12.0–15.0)
MCH: 28.9 pg (ref 26.0–34.0)
MCHC: 32.7 g/dL (ref 30.0–36.0)
MCV: 88.4 fL (ref 80.0–100.0)
Platelets: 207 10*3/uL (ref 150–400)
RBC: 3.95 MIL/uL (ref 3.87–5.11)
RDW: 12.9 % (ref 11.5–15.5)
WBC: 6.8 10*3/uL (ref 4.0–10.5)
nRBC: 0 % (ref 0.0–0.2)

## 2020-08-11 LAB — URINALYSIS, ROUTINE W REFLEX MICROSCOPIC
Bilirubin Urine: NEGATIVE
Glucose, UA: NEGATIVE mg/dL
Hgb urine dipstick: NEGATIVE
Ketones, ur: NEGATIVE mg/dL
Leukocytes,Ua: NEGATIVE
Nitrite: NEGATIVE
Protein, ur: NEGATIVE mg/dL
Specific Gravity, Urine: 1.009 (ref 1.005–1.030)
pH: 5.5 (ref 5.0–8.0)

## 2020-08-11 NOTE — ED Provider Notes (Signed)
[facility] EMERGENCY DEPT Provider Note   CSN: [id number] Arrival date & time: 08/11/20  1458     History Chief Complaint  Patient presents with   Loss of Consciousness    Hannah Madden is a 86 y.o. female.  86 year old lady presenting to ER with concern for loss of consciousness.  Patient reports that she was working in the yard this morning.  Came inside midday, while she was inside she felt lightheaded, nauseated and and then passed out.  When she fell hit the right side of her head on carpet.  Denies bladder or bowel incontinence, did not have any associated chest pain or difficulty breathing.  Does not have any ongoing symptoms at present.  Has been walking without difficulty since event.  Denies frequent episodes of passing out.  Denies any chronic medical problems.        Past Medical History:  Diagnosis Date   Alopecia    stress related and resolved since her Mother moved to SNF   GERD (gastroesophageal reflux disease)    takes Nexium daily   Glaucoma    uses eye drops daily   H/O hiatal hernia    Hypercholesteremia    takes Simvastatin daily   Pneumonia 1960    Patient Active Problem List   Diagnosis Date Noted   Diarrhea 08/07/2020   Urinary frequency 08/07/2020   Vitamin D deficiency 12/24/2019   Hyperglycemia 08/08/2019   Blood pressure elevated without history of HTN 07/07/2019   Upper back pain on left side 06/25/2019   Acute pharyngitis 05/21/2018   Memory changes 03/16/2018   Branch retinal vein occlusion of both eyes 04/03/2017   Peripheral focal chorioretinal inflammation of both eyes 04/03/2017   Primary open-angle glaucoma, bilateral, moderate stage 04/03/2017   Pseudophakia of both eyes 04/03/2017   Cough 03/01/2015   Macular edema 02/15/2015   Narrow angle glaucoma suspect of both eyes 02/15/2015   Arthritis of left lower extremity 08/24/2014   Pain and swelling of left knee 08/22/2014   Acute medial meniscal tear  09/12/2013   Encounter for well adult exam with abnormal findings 08/24/2012   CONTACT DERMATITIS 09/17/2007   GLAUCOMA 08/24/2007   DEGENERATIVE JOINT DISEASE 08/24/2007   HYPERCHOLESTEROLEMIA 08/23/2007   Anxiety state 08/23/2007   GERD 08/23/2007   ALOPECIA 08/23/2007   Allergic rhinitis 08/23/2007    Past Surgical History:  Procedure Laterality Date   ABDOMINAL HYSTERECTOMY     fibroids.   COLONOSCOPY     KNEE ARTHROSCOPY WITH MEDIAL MENISECTOMY Left 09/12/2013   Procedure: KNEE ARTHROSCOPY WITH DEBRIDEMENT;  Surgeon: Meredith Pel, MD;  Location: [facility];  Service: Orthopedics;  Laterality: Left;     OB History   No obstetric history on file.     Family History  Problem Relation Age of Onset   COPD Neg Hx    Diabetes Neg Hx    Heart disease Neg Hx    Stroke Neg Hx     Social History   Tobacco Use   Smoking status: Never   Smokeless tobacco: Never  Substance Use Topics   Alcohol use: No   Drug use: No    Home Medications Prior to Admission medications   Medication Sig Start Date End Date Taking? Authorizing Provider  brimonidine-timolol (COMBIGAN) 0.2-0.5 % ophthalmic solution Place 1 drop into both eyes 2 (two) times daily.   Yes [provider]  brinzolamide (AZOPT) 1 % ophthalmic suspension Place 1 drop into both eyes 2 (two) times  daily.   Yes [provider]  rosuvastatin (CRESTOR) 20 MG tablet TAKE 1 TABLET(20 MG) BY MOUTH DAILY 08/07/20  Yes Biagio Borg, MD  tobramycin (TOBREX) 0.3 % ophthalmic solution  03/08/19  Yes [provider]  triamcinolone (NASACORT) 55 MCG/ACT AERO nasal inhaler Place 2 sprays into the nose daily. 07/07/19  Yes Biagio Borg, MD  vitamin B-12 (CYANOCOBALAMIN) 1000 MCG tablet Take 1 tablet (1,000 mcg total) by mouth daily. 08/08/20  Yes Biagio Borg, MD  vitamin C (ASCORBIC ACID) 500 MG tablet Take 1,000 mg by mouth daily.   Yes [provider]  Cholecalciferol (THERA-D 2000) 50 MCG (2000 UT)  TABS 1 tab by mouth once daily 08/08/20   Biagio Borg, MD  citalopram (CELEXA) 10 MG tablet Take 1 tablet (10 mg total) by mouth daily. 08/07/20 08/07/21  Biagio Borg, MD  COVID-19 mRNA Vac-TriS, Pfizer, (PFIZER-BIONT COVID-19 VAC-TRIS) SUSP injection Inject into the muscle. 08/07/20   Carlyle Basques, MD  COVID-19 mRNA vaccine, Pfizer, 30 MCG/0.3ML injection  02/04/20   [provider]  dorzolamide (TRUSOPT) 2 % ophthalmic solution  04/27/20   [provider]  guaiFENesin (MUCINEX) 600 MG 12 hr tablet Take 2 tablets (1,200 mg total) by mouth 2 (two) times daily as needed. 07/07/19   Biagio Borg, MD  Influenza Virus Vacc Split PF 0.5 ML SUSY Fluzone 2013-2014(PF) 45 mcg (15 mcg x 3)/0.5 mL intramuscular syringe  inject 0.5 milliliter intramuscularly    [provider]  metoCLOPramide (REGLAN) 10 MG tablet Take 1 tablet (10 mg total) by mouth 4 (four) times daily. 03/05/20   Biagio Borg, MD  pantoprazole (PROTONIX) 40 MG tablet Take 1 tablet (40 mg total) by mouth daily. 08/07/20   Biagio Borg, MD    Allergies    Atorvastatin and Penicillins  Review of Systems   Review of Systems  Constitutional:  Negative for chills and fever.  HENT:  Negative for ear pain and sore throat.   Eyes:  Negative for pain and visual disturbance.  Respiratory:  Negative for cough and shortness of breath.   Cardiovascular:  Negative for chest pain and palpitations.  Gastrointestinal:  Negative for abdominal pain and vomiting.  Genitourinary:  Negative for dysuria and hematuria.  Musculoskeletal:  Negative for arthralgias and back pain.  Skin:  Negative for color change and rash.  Neurological:  Positive for syncope. Negative for seizures.  All other systems reviewed and are negative.  Physical Exam Updated Vital Signs BP (!) 174/78 (BP Location: Right Arm)   Pulse 73   Temp 98.3 F (36.8 C) (Oral)   Resp 18   Ht 5\' 5"  (1.651 m)   Wt 54.4 kg   SpO2 100%   BMI 19.97 kg/m    Physical Exam Vitals and nursing note reviewed.  Constitutional:      General: She is not in acute distress.    Appearance: She is well-developed.  HENT:     Head: Normocephalic and atraumatic.  Eyes:     Conjunctiva/sclera: Conjunctivae normal.  Cardiovascular:     Rate and Rhythm: Normal rate and regular rhythm.     Heart sounds: No murmur heard. Pulmonary:     Effort: Pulmonary effort is normal. No respiratory distress.     Breath sounds: Normal breath sounds.  Abdominal:     Palpations: Abdomen is soft.     Tenderness: There is no abdominal tenderness.  Musculoskeletal:  General: No deformity or signs of injury.     Cervical back: Neck supple.  Skin:    General: Skin is warm and dry.  Neurological:     General: No focal deficit present.     Mental Status: She is alert.  Psychiatric:        Mood and Affect: Mood normal.    ED Results / Procedures / Treatments   Labs (all labs ordered are listed, but only abnormal results are displayed) Labs Reviewed  CBC - Abnormal; Notable for the following components:      Result Value   Hemoglobin 11.4 (*)    HCT 34.9 (*)    All other components within normal limits  BASIC METABOLIC PANEL  URINALYSIS, ROUTINE W REFLEX MICROSCOPIC  CBG MONITORING, ED    EKG None  Radiology CT Head Wo Contrast  Result Date: 08/11/2020 CLINICAL DATA:  Fall, hit right neck and head EXAM: CT HEAD WITHOUT CONTRAST TECHNIQUE: Contiguous axial images were obtained from the base of the skull through the vertex without intravenous contrast. COMPARISON:  None. FINDINGS: Brain: No acute intracranial abnormality. Specifically, no hemorrhage, hydrocephalus, mass lesion, acute infarction, or significant intracranial injury. Vascular: No hyperdense vessel or unexpected calcification. Skull: No acute calvarial abnormality. Sinuses/Orbits: No acute findings Other: None IMPRESSION: No acute intracranial abnormality. Electronically Signed   By: Rolm Baptise M.D.   On: 08/11/2020 20:16   CT Cervical Spine Wo Contrast  Result Date: 08/11/2020 CLINICAL DATA:  Fall EXAM: CT CERVICAL SPINE WITHOUT CONTRAST TECHNIQUE: Multidetector CT imaging of the cervical spine was performed without intravenous contrast. Multiplanar CT image reconstructions were also generated. COMPARISON:  None. FINDINGS: Alignment: Normal Skull base and vertebrae: No acute fracture. No primary bone lesion or focal pathologic process. Soft tissues and spinal canal: No prevertebral fluid or swelling. No visible canal hematoma. Disc levels:  Diffuse degenerative disc disease and facet disease. Upper chest: No acute findings Other: None IMPRESSION: Degenerative disc and facet disease.  No acute bony abnormality. Electronically Signed   By: Rolm Baptise M.D.   On: 08/11/2020 20:17    Procedures Procedures   Medications Ordered in ED Medications - No data to display  ED Course  I have reviewed the triage vital signs and the nursing notes.  Pertinent labs & imaging results that were available during my care of the patient were reviewed by me and considered in my medical decision making (see chart for details).    MDM Rules/Calculators/A&P                          86 year old lady presents here after syncopal episode.  She also reported head trauma.  On exam patient appears remarkably well-appearing in no distress with stable vitals.  EKG without acute ischemic change, no associated chest pain.  Basic labs are stable.  Head imaging and neck imaging negative for acute traumatic process.  After I evaluated patient, recommended obtaining screening chest x-ray as part of general syncope work-up however patient states that she desired to be discharged home.  Discussed diagnostic benefits but pt ultimately declined test. Instructed her to follow-up with her primary doctor to discuss further testing and monitoring in the outpatient setting.  Given her current appearance and work-up today  believe she is appropriate for outpatient management.  After the discussed management above, the patient was determined to be safe for discharge.  The patient was in agreement with this plan and all questions regarding  their care were answered.  ED return precautions were discussed and the patient will return to the ED with any significant worsening of condition.  Final Clinical Impression(s) / ED Diagnoses Final diagnoses:  Syncope, unspecified syncope type    Rx / DC Orders ED Discharge Orders     None        Lucrezia Starch, MD 08/12/20 [phone]

## 2020-08-11 NOTE — ED Triage Notes (Signed)
At 1200 pt was walking and lost consciousness and fell to the floor. Pt thinks she hit the right side of her head on the carpeted bedroom.. Pt didn't think she was out but   Pt stated that she had a quick onset of nausea and she started to the bathroom to throw up. She stated that she dry heaved and that's when she passed out   Nausea went way after syncopal episode and she currently denis N/V.

## 2020-08-11 NOTE — Discharge Instructions (Addendum)
Please follow-up with your primary care doctor to discuss the episode of passing out today.  If you have any additional episodes of passing out, you develop any chest pain, difficulty breathing, vomiting or other new concerning symptom, come back to ER for reassessment.

## 2020-08-11 NOTE — ED Notes (Signed)
Pt can not remember all medication she is taking

## 2020-08-12 NOTE — ED Provider Notes (Signed)
Nursing requested patient be evaluated for head and neck injury after a fall last night.  She was quickly evaluated by me and will place some orders and she will go back to the waiting room to await a full evaluation by subsequent team.    Hannah Madden is a 86 y.o. female with a past medical history significant for GERD, hypercholesterolemia, glaucoma, and degenerative changes who presents with a fall last night.  Patient reports that she was watching TV and started having some nausea and dry heaving and then stood up and then fell to the ground.  She reports hitting the posterior superior right neck and right occiput on the ground but did not lose consciousness fully.  She reports she was lightheaded afterwards.  She reports that she has felt back to her baseline and normal today but 1 to make sure she did not injure herself during this fall and head injury.  Denies any history of head or neck injuries.  On exam, no focal neurologic deficits.  Very mild tenderness on the right posterior neck and no crepitance palpated.  No carotid bruit.  Ear exam unremarkable.  Lungs clear chest nontender.  Abdomen nontender, back nontender.  Patient otherwise well-appearing.  Had a shared decision made conversation with patient and she does not want lab testing at this time.  She does agree with getting a CT of the head and neck and if that is reassuring, dissipate discharge home with likely musculoskeletal discomfort after this fall.  Patient agrees with plan of care, anticipate reassessment after work-up.   Hannah Madden, Hannah Allegra, MD 08/12/20 [phone]

## 2020-08-13 ENCOUNTER — Other Ambulatory Visit (HOSPITAL_BASED_OUTPATIENT_CLINIC_OR_DEPARTMENT_OTHER): Payer: Self-pay

## 2020-08-13 ENCOUNTER — Encounter: Payer: Self-pay | Admitting: Internal Medicine

## 2020-08-14 ENCOUNTER — Other Ambulatory Visit: Payer: Self-pay | Admitting: Internal Medicine

## 2020-08-14 DIAGNOSIS — Z1231 Encounter for screening mammogram for malignant neoplasm of breast: Secondary | ICD-10-CM

## 2020-08-17 ENCOUNTER — Encounter: Payer: Self-pay | Admitting: Internal Medicine

## 2020-08-17 ENCOUNTER — Ambulatory Visit (INDEPENDENT_AMBULATORY_CARE_PROVIDER_SITE_OTHER): Payer: Medicare PPO | Admitting: Internal Medicine

## 2020-08-17 ENCOUNTER — Other Ambulatory Visit: Payer: Self-pay

## 2020-08-17 ENCOUNTER — Ambulatory Visit (INDEPENDENT_AMBULATORY_CARE_PROVIDER_SITE_OTHER): Payer: Medicare PPO

## 2020-08-17 VITALS — BP 122/78 | HR 61 | Temp 98.7°F | Ht 65.0 in | Wt 125.2 lb

## 2020-08-17 DIAGNOSIS — R413 Other amnesia: Secondary | ICD-10-CM

## 2020-08-17 DIAGNOSIS — R739 Hyperglycemia, unspecified: Secondary | ICD-10-CM | POA: Diagnosis not present

## 2020-08-17 DIAGNOSIS — R197 Diarrhea, unspecified: Secondary | ICD-10-CM | POA: Diagnosis not present

## 2020-08-17 DIAGNOSIS — R21 Rash and other nonspecific skin eruption: Secondary | ICD-10-CM | POA: Diagnosis not present

## 2020-08-17 DIAGNOSIS — J309 Allergic rhinitis, unspecified: Secondary | ICD-10-CM | POA: Diagnosis not present

## 2020-08-17 DIAGNOSIS — R55 Syncope and collapse: Secondary | ICD-10-CM

## 2020-08-17 DIAGNOSIS — R030 Elevated blood-pressure reading, without diagnosis of hypertension: Secondary | ICD-10-CM

## 2020-08-17 MED ORDER — ONDANSETRON HCL 4 MG PO TABS: 4.0000 mg | ORAL_TABLET | Freq: Three times a day (TID) | ORAL | 0 refills | Status: DC | PRN

## 2020-08-17 NOTE — Patient Instructions (Addendum)
Please remember to drink plenty of fluids every day  Please take all new medication as prescribed - the zofran as needed for nausea  Please continue all other medications as before, at least the protonix and the statin  Please have the pharmacy call with any other refills you may need.  Please keep your appointments with your specialists as you may have planned  If you have any further trouble swallowing, we should have you see Gastroenterology  Please go to the XRAY Department in the first floor for the x-ray testing  You will be contacted by phone if any changes need to be made immediately.  Otherwise, you will receive a letter about your results with an explanation, but please check with MyChart first.  Please remember to sign up for MyChart if you have not done so, as this will be important to you in the future with finding out test results, communicating by private email, and scheduling acute appointments online when needed.  Please make an Appointment to return in Oct 24, or sooner if needed

## 2020-08-17 NOTE — Progress Notes (Signed)
Patient ID: Hannah Madden, female   DOB: 1934-04-28, 86 y.o.   MRN: [id number]        Chief Complaint: follow up recent ED visit       HPI:  Hannah Madden is a 86 y.o. female here with 2 daughter present after pt with dementia suffered syncope June 11, pt was working in the yard, reportedy felt lightheaded, nausea and came inside and lost consciousness, struck right side of head/occiput to carpet.  Denies incontinence and reported no associated symptoms including CP and no siezure like activity noted.  Came to ED where CT head and C spine neg for acute, ECG benign, UA and cbc neg for acute.  Exam o/w benign as well, pt wanted to leave and was felt stable to do so.  Pt has been in her home where one of her daughters has stayed with her 24.7 since that episode but reports no significant new problems.  Pt reports tender right side of head/occiput but no swelling or laceration.  Daughters believe episode possible related to overall low fluid po intake and working outside. Has also had recent diarrhea and GI referral placed and pending from June 7.   Since ED pt reports some nausea as well mild intermittent but Denies worsening reflux, abd pain, dysphagia, vomiting, bowel change or blood.  Also, Does have several wks ongoing nasal allergy symptoms with clearish congestion, itch and sneezing, without fever, pain, ST, cough, swelling or wheezing.         Wt Readings from Last 3 Encounters:  08/17/20 125 lb 3.2 oz (56.8 kg)  08/11/20 120 lb (54.4 kg)  08/07/20 124 lb (56.2 kg)   BP Readings from Last 3 Encounters:  08/17/20 122/78  08/11/20 (!) 174/78  08/07/20 (!) 160/88         Past Medical History:  Diagnosis Date   Alopecia    stress related and resolved since her Mother moved to SNF   GERD (gastroesophageal reflux disease)    takes Nexium daily   Glaucoma    uses eye drops daily   H/O hiatal hernia    Hypercholesteremia    takes Simvastatin daily   Pneumonia 1960   Past Surgical  History:  Procedure Laterality Date   ABDOMINAL HYSTERECTOMY     fibroids.   COLONOSCOPY     KNEE ARTHROSCOPY WITH MEDIAL MENISECTOMY Left 09/12/2013   Procedure: KNEE ARTHROSCOPY WITH DEBRIDEMENT;  Surgeon: Meredith Pel, MD;  Location: [facility];  Service: Orthopedics;  Laterality: Left;    reports that she has never smoked. She has never used smokeless tobacco. She reports that she does not drink alcohol and does not use drugs. family history is not on file. Allergies  Allergen Reactions   Atorvastatin     REACTION: INTOL to Lipitor w/ leg pain   Penicillins     REACTION: hives   Current Outpatient Medications on File Prior to Visit  Medication Sig Dispense Refill   brimonidine-timolol (COMBIGAN) 0.2-0.5 % ophthalmic solution Place 1 drop into both eyes 2 (two) times daily.     brinzolamide (AZOPT) 1 % ophthalmic suspension Place 1 drop into both eyes 2 (two) times daily.     Cholecalciferol (THERA-D 2000) 50 MCG (2000 UT) TABS 1 tab by mouth once daily 30 tablet 99   citalopram (CELEXA) 10 MG tablet Take 1 tablet (10 mg total) by mouth daily. 90 tablet 3   COVID-19 mRNA Vac-TriS, Pfizer, (PFIZER-BIONT COVID-19 VAC-TRIS) SUSP injection Inject into the  muscle. 0.3 mL 0   COVID-19 mRNA vaccine, Pfizer, 30 MCG/0.3ML injection      dorzolamide (TRUSOPT) 2 % ophthalmic solution      guaiFENesin (MUCINEX) 600 MG 12 hr tablet Take 2 tablets (1,200 mg total) by mouth 2 (two) times daily as needed. 60 tablet 0   Influenza Virus Vacc Split PF 0.5 ML SUSY Fluzone 2013-2014(PF) 45 mcg (15 mcg x 3)/0.5 mL intramuscular syringe  inject 0.5 milliliter intramuscularly     metoCLOPramide (REGLAN) 10 MG tablet Take 1 tablet (10 mg total) by mouth 4 (four) times daily. 60 tablet 2   pantoprazole (PROTONIX) 40 MG tablet Take 1 tablet (40 mg total) by mouth daily. 90 tablet 3   rosuvastatin (CRESTOR) 20 MG tablet TAKE 1 TABLET(20 MG) BY MOUTH DAILY 90 tablet 3   tobramycin (TOBREX) 0.3 % ophthalmic  solution      triamcinolone (NASACORT) 55 MCG/ACT AERO nasal inhaler Place 2 sprays into the nose daily. 1 Inhaler 12   vitamin B-12 (CYANOCOBALAMIN) 1000 MCG tablet Take 1 tablet (1,000 mcg total) by mouth daily. 90 tablet 3   vitamin C (ASCORBIC ACID) 500 MG tablet Take 1,000 mg by mouth daily.     No current facility-administered medications on file prior to visit.        ROS:  All others reviewed and negative.  Objective        PE:  BP 122/78 (BP Location: Left Arm, Patient Position: Sitting, Cuff Size: Normal)   Pulse 61   Temp 98.7 F (37.1 C) (Oral)   Ht 5\' 5"  (1.651 m)   Wt 125 lb 3.2 oz (56.8 kg)   SpO2 98%   BMI 20.83 kg/m                 Constitutional: Pt appears in NAD               HENT: Head: NCAT.                Right Ear: External ear normal.                 Left Ear: External ear normal.                Eyes: . Pupils are equal, round, and reactive to light. Conjunctivae and EOM are normal               Nose: without d/c or deformity               Neck: Neck supple. Gross normal ROM               Cardiovascular: Normal rate and regular rhythm.                 Pulmonary/Chest: Effort normal and breath sounds without rales or wheezing.                Abd:  Soft, NT, ND, + BS, no organomegaly               Neurological: Pt is alert. At baseline orientation, motor grossly intact               Skin: Skin is warm. No rashes, no other new lesions, LE edema - none               Psychiatric: Pt behavior is normal without agitation   Micro: none  Cardiac tracings I have personally interpreted today:  none  Pertinent Radiological  findings (summarize): none   Lab Results  Component Value Date   WBC 6.8 08/11/2020   HGB 11.4 (L) 08/11/2020   HCT 34.9 (L) 08/11/2020   PLT 207 08/11/2020   GLUCOSE 99 08/11/2020   CHOL 263 (H) 08/07/2020   TRIG 134.0 08/07/2020   HDL 75.90 08/07/2020   LDLDIRECT 152.0 10/25/2015   LDLCALC 161 (H) 08/07/2020   ALT 8 08/07/2020    AST 15 08/07/2020   NA 135 08/11/2020   K 4.0 08/11/2020   CL 101 08/11/2020   CREATININE 0.87 08/11/2020   BUN 13 08/11/2020   CO2 25 08/11/2020   TSH 1.36 08/07/2020   HGBA1C 5.6 08/07/2020   Assessment/Plan:  Hannah Madden is a 86 y.o. Black or African American [2] female with  has a past medical history of Alopecia, GERD (gastroesophageal reflux disease), Glaucoma, H/O hiatal hernia, Hypercholesteremia, and Pneumonia (1960).  Blood pressure elevated without history of HTN Improved today unclear reasons,  to f/u any worsening symptoms or concerns  Diarrhea zofran given for nausea and Gi referral is pending for GI complaints as requested per family  Hyperglycemia Lab Results  Component Value Date   HGBA1C 5.6 08/07/2020   Stable, pt to continue current medical treatment - diet, doubt any relation to recent syncope   Memory changes High suspicion for dementia worsening, pt currently able to stay by herself with support but I think likley to need assisted living relatively soon, duaghters and pt decline MRI or neurology at this time  Syncope I suspect vasovagal likely related to GI symptoms, and reduce po intake recently, also for cxr today, but duaghter and pt decline other evaluation such as mri head, carotid u/s, echo or cardiology referral  Allergic rhinitis Mild uncontrolled, ok for otc allegra prn  Followup: Return if symptoms worsen or fail to improve.  Cathlean Cower, MD 08/20/2020 7:33 PM [facility]

## 2020-08-19 ENCOUNTER — Encounter: Payer: Self-pay | Admitting: Internal Medicine

## 2020-08-20 ENCOUNTER — Encounter: Payer: Self-pay | Admitting: Internal Medicine

## 2020-08-20 DIAGNOSIS — R55 Syncope and collapse: Secondary | ICD-10-CM

## 2020-08-20 HISTORY — DX: Syncope and collapse: R55

## 2020-08-20 NOTE — Assessment & Plan Note (Signed)
Improved today unclear reasons,  to f/u any worsening symptoms or concerns

## 2020-08-20 NOTE — Assessment & Plan Note (Signed)
zofran given for nausea and Gi referral is pending for GI complaints as requested per family

## 2020-08-20 NOTE — Assessment & Plan Note (Signed)
Mild uncontrolled, ok for otc allegra prn

## 2020-08-20 NOTE — Assessment & Plan Note (Signed)
I suspect vasovagal likely related to GI symptoms, and reduce po intake recently, also for cxr today, but duaghter and pt decline other evaluation such as mri head, carotid u/s, echo or cardiology referral

## 2020-08-20 NOTE — Assessment & Plan Note (Signed)
Lab Results  Component Value Date   HGBA1C 5.6 08/07/2020   Stable, pt to continue current medical treatment - diet, doubt any relation to recent syncope

## 2020-08-20 NOTE — Assessment & Plan Note (Signed)
High suspicion for dementia worsening, pt currently able to stay by herself with support but I think likley to need assisted living relatively soon, duaghters and pt decline MRI or neurology at this time

## 2020-09-14 DIAGNOSIS — H34811 Central retinal vein occlusion, right eye, with macular edema: Secondary | ICD-10-CM | POA: Diagnosis not present

## 2020-09-14 DIAGNOSIS — H35371 Puckering of macula, right eye: Secondary | ICD-10-CM | POA: Diagnosis not present

## 2020-09-14 DIAGNOSIS — H31012 Macula scars of posterior pole (postinflammatory) (post-traumatic), left eye: Secondary | ICD-10-CM | POA: Diagnosis not present

## 2020-09-14 DIAGNOSIS — H43813 Vitreous degeneration, bilateral: Secondary | ICD-10-CM | POA: Diagnosis not present

## 2020-10-05 ENCOUNTER — Ambulatory Visit: Payer: Medicare PPO

## 2020-10-05 DIAGNOSIS — H34811 Central retinal vein occlusion, right eye, with macular edema: Secondary | ICD-10-CM | POA: Diagnosis not present

## 2020-10-05 DIAGNOSIS — H43813 Vitreous degeneration, bilateral: Secondary | ICD-10-CM | POA: Diagnosis not present

## 2020-10-05 DIAGNOSIS — H31012 Macula scars of posterior pole (postinflammatory) (post-traumatic), left eye: Secondary | ICD-10-CM | POA: Diagnosis not present

## 2020-10-05 DIAGNOSIS — H35371 Puckering of macula, right eye: Secondary | ICD-10-CM | POA: Diagnosis not present

## 2020-10-19 DIAGNOSIS — H31012 Macula scars of posterior pole (postinflammatory) (post-traumatic), left eye: Secondary | ICD-10-CM | POA: Diagnosis not present

## 2020-10-19 DIAGNOSIS — H35371 Puckering of macula, right eye: Secondary | ICD-10-CM | POA: Diagnosis not present

## 2020-10-19 DIAGNOSIS — H34811 Central retinal vein occlusion, right eye, with macular edema: Secondary | ICD-10-CM | POA: Diagnosis not present

## 2020-10-19 DIAGNOSIS — H43813 Vitreous degeneration, bilateral: Secondary | ICD-10-CM | POA: Diagnosis not present

## 2020-11-15 DIAGNOSIS — H401134 Primary open-angle glaucoma, bilateral, indeterminate stage: Secondary | ICD-10-CM | POA: Diagnosis not present

## 2020-12-24 ENCOUNTER — Encounter: Payer: Self-pay | Admitting: Internal Medicine

## 2020-12-24 ENCOUNTER — Other Ambulatory Visit: Payer: Self-pay

## 2020-12-24 ENCOUNTER — Ambulatory Visit (INDEPENDENT_AMBULATORY_CARE_PROVIDER_SITE_OTHER): Payer: Medicare PPO | Admitting: Internal Medicine

## 2020-12-24 VITALS — BP 118/60 | HR 74 | Ht 65.0 in | Wt 122.0 lb

## 2020-12-24 DIAGNOSIS — E7800 Pure hypercholesterolemia, unspecified: Secondary | ICD-10-CM

## 2020-12-24 DIAGNOSIS — E559 Vitamin D deficiency, unspecified: Secondary | ICD-10-CM

## 2020-12-24 DIAGNOSIS — F411 Generalized anxiety disorder: Secondary | ICD-10-CM

## 2020-12-24 DIAGNOSIS — E538 Deficiency of other specified B group vitamins: Secondary | ICD-10-CM

## 2020-12-24 DIAGNOSIS — Z23 Encounter for immunization: Secondary | ICD-10-CM | POA: Diagnosis not present

## 2020-12-24 DIAGNOSIS — R413 Other amnesia: Secondary | ICD-10-CM | POA: Diagnosis not present

## 2020-12-24 NOTE — Progress Notes (Signed)
Patient ID: Hannah Madden, female   DOB: 05-May-1934, 86 y.o.   MRN: [id number]

## 2020-12-24 NOTE — Progress Notes (Signed)
Patient ID: Hannah Madden, female   DOB: [birth year], 86 y.o.   MRN: [id number]        Chief Complaint: follow up memory changes, low b12, low vit d       HPI:  Hannah Madden is a 86 y.o. female here with family with further concerns about worsening memory issue in the past several months, daughter very attentive and has realized mother living alone and no longer taking B12, D, statin, and celexa.  Pt states she didn't realize she needed these and not sure needs them now.  Pt denies chest pain, increased sob or doe, wheezing, orthopnea, PND, increased LE swelling, palpitations, dizziness or syncope.   Pt denies polydipsia, polyuria, or new focal neuro s/s.  Denies worsening depressive symptoms, suicidal ideation, or panic; but has moderate worsening anxiety per daughter, not clear hoe much of this is a factor in her memory difficulty.   Pt denies fever, wt loss, night sweats, loss of appetite, or other constitutional symptoms, though appears to have lost several labs since last visit.  Wt Readings from Last 3 Encounters:  12/24/20 122 lb (55.3 kg)  08/17/20 125 lb 3.2 oz (56.8 kg)  08/11/20 120 lb (54.4 kg)   BP Readings from Last 3 Encounters:  12/24/20 118/60  08/17/20 122/78  08/11/20 (!) 174/78         Past Medical History:  Diagnosis Date   Alopecia    stress related and resolved since her Mother moved to SNF   GERD (gastroesophageal reflux disease)    takes Nexium daily   Glaucoma    uses eye drops daily   H/O hiatal hernia    Hypercholesteremia    takes Simvastatin daily   Pneumonia 1960   Past Surgical History:  Procedure Laterality Date   ABDOMINAL HYSTERECTOMY     fibroids.   COLONOSCOPY     KNEE ARTHROSCOPY WITH MEDIAL MENISECTOMY Left 09/12/2013   Procedure: KNEE ARTHROSCOPY WITH DEBRIDEMENT;  Surgeon: Meredith Pel, MD;  Location: [facility];  Service: Orthopedics;  Laterality: Left;    reports that she has never smoked. She has never used smokeless tobacco. She  reports that she does not drink alcohol and does not use drugs. family history is not on file. Allergies  Allergen Reactions   Atorvastatin     REACTION: INTOL to Lipitor w/ leg pain   Penicillins     REACTION: hives   Current Outpatient Medications on File Prior to Visit  Medication Sig Dispense Refill   brimonidine-timolol (COMBIGAN) 0.2-0.5 % ophthalmic solution Place 1 drop into both eyes 2 (two) times daily.     brinzolamide (AZOPT) 1 % ophthalmic suspension Place 1 drop into both eyes 2 (two) times daily.     dorzolamide (TRUSOPT) 2 % ophthalmic solution      tobramycin (TOBREX) 0.3 % ophthalmic solution      Cholecalciferol (THERA-D 2000) 50 MCG (2000 UT) TABS 1 tab by mouth once daily (Patient not taking: Reported on 12/24/2020) 30 tablet 99   citalopram (CELEXA) 10 MG tablet Take 1 tablet (10 mg total) by mouth daily. 90 tablet 3   guaiFENesin (MUCINEX) 600 MG 12 hr tablet Take 2 tablets (1,200 mg total) by mouth 2 (two) times daily as needed. (Patient not taking: Reported on 12/24/2020) 60 tablet 0   metoCLOPramide (REGLAN) 10 MG tablet Take 1 tablet (10 mg total) by mouth 4 (four) times daily. (Patient not taking: Reported on 12/24/2020) 60 tablet 2   pantoprazole (  PROTONIX) 40 MG tablet Take 1 tablet (40 mg total) by mouth daily. (Patient not taking: Reported on 12/24/2020) 90 tablet 3   rosuvastatin (CRESTOR) 20 MG tablet TAKE 1 TABLET(20 MG) BY MOUTH DAILY (Patient not taking: Reported on 12/24/2020) 90 tablet 3   triamcinolone (NASACORT) 55 MCG/ACT AERO nasal inhaler Place 2 sprays into the nose daily. (Patient not taking: Reported on 12/24/2020) 1 Inhaler 12   vitamin B-12 (CYANOCOBALAMIN) 1000 MCG tablet Take 1 tablet (1,000 mcg total) by mouth daily. (Patient not taking: Reported on 12/24/2020) 90 tablet 3   vitamin C (ASCORBIC ACID) 500 MG tablet Take 1,000 mg by mouth daily. (Patient not taking: Reported on 12/24/2020)     No current facility-administered medications on  file prior to visit.        ROS:  All others reviewed and negative.  Objective        PE:  BP 118/60 (BP Location: Left Arm, Patient Position: Sitting, Cuff Size: Normal)   Pulse 74   Ht 5\' 5"  (1.651 m)   Wt 122 lb (55.3 kg)   SpO2 97%   BMI 20.30 kg/m                 Constitutional: Pt appears in NAD               HENT: Head: NCAT.                Right Ear: External ear normal.                 Left Ear: External ear normal.                Eyes: . Pupils are equal, round, and reactive to light. Conjunctivae and EOM are normal               Nose: without d/c or deformity               Neck: Neck supple. Gross normal ROM               Cardiovascular: Normal rate and regular rhythm.                 Pulmonary/Chest: Effort normal and breath sounds without rales or wheezing.                Abd:  Soft, NT, ND, + BS, no organomegaly               Neurological: Pt is alert. At baseline orientation, motor grossly intact               Skin: Skin is warm. No rashes, no other new lesions, LE edema - none               Psychiatric: Pt behavior is normal without agitation , mod nervous  Micro: none  Cardiac tracings I have personally interpreted today:  none  Pertinent Radiological findings (summarize): none   Lab Results  Component Value Date   WBC 6.8 08/11/2020   HGB 11.4 (L) 08/11/2020   HCT 34.9 (L) 08/11/2020   PLT 207 08/11/2020   GLUCOSE 99 08/11/2020   CHOL 263 (H) 08/07/2020   TRIG 134.0 08/07/2020   HDL 75.90 08/07/2020   LDLDIRECT 152.0 10/25/2015   LDLCALC 161 (H) 08/07/2020   ALT 8 08/07/2020   AST 15 08/07/2020   NA 135 08/11/2020   K 4.0 08/11/2020   CL 101 08/11/2020   CREATININE 0.87 08/11/2020  BUN 13 08/11/2020   CO2 25 08/11/2020   TSH 1.36 08/07/2020   HGBA1C 5.6 08/07/2020   Assessment/Plan:  Hannah Madden is a 86 y.o. Black or African American [2] female with  has a past medical history of Alopecia, GERD (gastroesophageal reflux disease),  Glaucoma, H/O hiatal hernia, Hypercholesteremia, and Pneumonia (1960).  HYPERCHOLESTEROLEMIA Lab Results  Component Value Date   LDLCALC 161 (H) 08/07/2020   Uncontrolled, and at end visit pt still hesitant to take; will hold further d/w pt in future, ok for hold crestor 20 for now  Anxiety state D/w pt, encouraged for celexa 10 qd but pt remains very hesitant, will hold for now, and d/w pt again next visit  Memory changes Possible worsening recent onset dementia vs pseudodementia, for MRI brain, and refer neurology  Vitamin D deficiency Last vitamin D Lab Results  Component Value Date   VD25OH 25.13 (L) 08/07/2020   Low pt encouraged to start oral replacement and continue to take   B12 deficiency Lab Results  Component Value Date   VITAMINB12 166 (L) 08/07/2020   Low, pt encouraged to start oral replacement - b12 1000 mcg qd with good compliance  Followup: Return in about 3 months (around 03/26/2021).  Cathlean Cower, MD 12/31/2020 9:40 AM [facility]

## 2020-12-24 NOTE — Patient Instructions (Signed)
Ok to [employer] on taking the celexa and crestor medications for now  Please take OTC Vitamin D3 at 2000 units per day, indefinitely  Please also start the OTC B12 1000 mcg per day  Please continue all other medications as before, and refills have been done if requested.  Please have the pharmacy call with any other refills you may need.  Please continue your efforts at being more active, low cholesterol diet, and weight control.  Please keep your appointments with your specialists as you may have planned  You will be contacted regarding the referral for: MRI, and the Neurology  Please make an Appointment to return in 3 months, or sooner if needed

## 2020-12-27 ENCOUNTER — Other Ambulatory Visit: Payer: Self-pay | Admitting: Internal Medicine

## 2020-12-27 MED ORDER — ONDANSETRON HCL 4 MG PO TABS: 4.0000 mg | ORAL_TABLET | Freq: Three times a day (TID) | ORAL | 2 refills | Status: DC | PRN

## 2020-12-31 ENCOUNTER — Encounter: Payer: Self-pay | Admitting: Internal Medicine

## 2020-12-31 ENCOUNTER — Ambulatory Visit: Payer: Medicare PPO | Attending: Internal Medicine

## 2020-12-31 ENCOUNTER — Other Ambulatory Visit: Payer: Self-pay

## 2020-12-31 ENCOUNTER — Other Ambulatory Visit (HOSPITAL_BASED_OUTPATIENT_CLINIC_OR_DEPARTMENT_OTHER): Payer: Self-pay

## 2020-12-31 DIAGNOSIS — Z23 Encounter for immunization: Secondary | ICD-10-CM

## 2020-12-31 DIAGNOSIS — E538 Deficiency of other specified B group vitamins: Secondary | ICD-10-CM | POA: Insufficient documentation

## 2020-12-31 MED ORDER — PFIZER COVID-19 VAC BIVALENT 30 MCG/0.3ML IM SUSP
INTRAMUSCULAR | 0 refills | Status: DC
  Filled 2020-12-31: qty 0.3, 1d supply, fill #0

## 2020-12-31 NOTE — Assessment & Plan Note (Signed)
Lab Results  Component Value Date   VITAMINB12 166 (L) 08/07/2020   Low, pt encouraged to start oral replacement - b12 1000 mcg qd with good compliance

## 2020-12-31 NOTE — Progress Notes (Signed)
   Covid-19 Vaccination Clinic  Name:  Hannah Madden    MRN: [id number] DOB: [birth year]  12/31/2020  [name] was observed post Covid-19 immunization for 15 minutes without incident. She was provided with Vaccine Information Sheet and instruction to access the V-Safe system.   [name] was instructed to call 911 with any severe reactions post vaccine: Difficulty breathing  Swelling of face and throat  A fast heartbeat  A bad rash all over body  Dizziness and weakness   Immunizations Administered     Name Date Dose VIS Date Route   Pfizer Covid-19 Vaccine Bivalent Booster 12/31/2020  2:37 PM 0.3 mL 10/31/2020 Intramuscular   Manufacturer: [place]   Lot: [id number]   [place]: [phone]

## 2020-12-31 NOTE — Assessment & Plan Note (Signed)
Last vitamin D Lab Results  Component Value Date   VD25OH 25.13 (L) 08/07/2020   Low pt encouraged to start oral replacement and continue to take

## 2020-12-31 NOTE — Assessment & Plan Note (Signed)
Lab Results  Component Value Date   LDLCALC 161 (H) 08/07/2020   Uncontrolled, and at end visit pt still hesitant to take; will hold further d/w pt in future, ok for hold crestor 20 for now

## 2020-12-31 NOTE — Assessment & Plan Note (Signed)
Possible worsening recent onset dementia vs pseudodementia, for MRI brain, and refer neurology

## 2020-12-31 NOTE — Assessment & Plan Note (Addendum)
D/w pt, encouraged for celexa 10 qd but pt remains very hesitant, will hold for now, and d/w pt again next visit

## 2021-01-08 DIAGNOSIS — H34811 Central retinal vein occlusion, right eye, with macular edema: Secondary | ICD-10-CM | POA: Diagnosis not present

## 2021-01-08 DIAGNOSIS — H35371 Puckering of macula, right eye: Secondary | ICD-10-CM | POA: Diagnosis not present

## 2021-01-08 DIAGNOSIS — H31012 Macula scars of posterior pole (postinflammatory) (post-traumatic), left eye: Secondary | ICD-10-CM | POA: Diagnosis not present

## 2021-01-08 DIAGNOSIS — H43813 Vitreous degeneration, bilateral: Secondary | ICD-10-CM | POA: Diagnosis not present

## 2021-03-05 DIAGNOSIS — H43813 Vitreous degeneration, bilateral: Secondary | ICD-10-CM | POA: Diagnosis not present

## 2021-03-05 DIAGNOSIS — H34811 Central retinal vein occlusion, right eye, with macular edema: Secondary | ICD-10-CM | POA: Diagnosis not present

## 2021-03-05 DIAGNOSIS — H31012 Macula scars of posterior pole (postinflammatory) (post-traumatic), left eye: Secondary | ICD-10-CM | POA: Diagnosis not present

## 2021-03-05 DIAGNOSIS — H35371 Puckering of macula, right eye: Secondary | ICD-10-CM | POA: Diagnosis not present

## 2021-03-11 ENCOUNTER — Encounter: Payer: Self-pay | Admitting: Internal Medicine

## 2021-03-11 DIAGNOSIS — R197 Diarrhea, unspecified: Secondary | ICD-10-CM

## 2021-03-11 DIAGNOSIS — R110 Nausea: Secondary | ICD-10-CM

## 2021-03-14 DIAGNOSIS — R110 Nausea: Secondary | ICD-10-CM | POA: Diagnosis not present

## 2021-03-14 DIAGNOSIS — K219 Gastro-esophageal reflux disease without esophagitis: Secondary | ICD-10-CM | POA: Diagnosis not present

## 2021-03-14 DIAGNOSIS — R300 Dysuria: Secondary | ICD-10-CM | POA: Diagnosis not present

## 2021-03-14 DIAGNOSIS — N390 Urinary tract infection, site not specified: Secondary | ICD-10-CM | POA: Diagnosis not present

## 2021-03-14 DIAGNOSIS — R142 Eructation: Secondary | ICD-10-CM | POA: Diagnosis not present

## 2021-03-14 DIAGNOSIS — Z03818 Encounter for observation for suspected exposure to other biological agents ruled out: Secondary | ICD-10-CM | POA: Diagnosis not present

## 2021-03-18 ENCOUNTER — Encounter: Payer: Self-pay | Admitting: Gastroenterology

## 2021-03-28 ENCOUNTER — Ambulatory Visit: Payer: Medicare PPO | Admitting: Internal Medicine

## 2021-04-17 DIAGNOSIS — H401134 Primary open-angle glaucoma, bilateral, indeterminate stage: Secondary | ICD-10-CM | POA: Diagnosis not present

## 2021-04-17 DIAGNOSIS — H402234 Chronic angle-closure glaucoma, bilateral, indeterminate stage: Secondary | ICD-10-CM | POA: Diagnosis not present

## 2021-04-23 ENCOUNTER — Ambulatory Visit (INDEPENDENT_AMBULATORY_CARE_PROVIDER_SITE_OTHER): Payer: Medicare PPO | Admitting: Gastroenterology

## 2021-04-23 ENCOUNTER — Encounter: Payer: Self-pay | Admitting: Gastroenterology

## 2021-04-23 ENCOUNTER — Other Ambulatory Visit (INDEPENDENT_AMBULATORY_CARE_PROVIDER_SITE_OTHER): Payer: Medicare PPO

## 2021-04-23 VITALS — BP 112/72 | HR 72 | Ht 65.0 in | Wt 123.4 lb

## 2021-04-23 DIAGNOSIS — R142 Eructation: Secondary | ICD-10-CM

## 2021-04-23 DIAGNOSIS — D649 Anemia, unspecified: Secondary | ICD-10-CM | POA: Diagnosis not present

## 2021-04-23 DIAGNOSIS — K219 Gastro-esophageal reflux disease without esophagitis: Secondary | ICD-10-CM | POA: Diagnosis not present

## 2021-04-23 DIAGNOSIS — R634 Abnormal weight loss: Secondary | ICD-10-CM | POA: Diagnosis not present

## 2021-04-23 DIAGNOSIS — R110 Nausea: Secondary | ICD-10-CM | POA: Diagnosis not present

## 2021-04-23 LAB — IBC + FERRITIN
Ferritin: 59.1 ng/mL (ref 10.0–291.0)
Iron: 71 ug/dL (ref 42–145)
Saturation Ratios: 20 % (ref 20.0–50.0)
TIBC: 355.6 ug/dL (ref 250.0–450.0)
Transferrin: 254 mg/dL (ref 212.0–360.0)

## 2021-04-23 NOTE — Patient Instructions (Addendum)
If you are age 85 or older, your body mass index should be between 23-30. Your Body mass index is 20.53 kg/m. If this is out of the aforementioned range listed, please consider follow up with your Primary Care Provider.  If you are age 63 or younger, your body mass index should be between 19-25. Your Body mass index is 20.53 kg/m. If this is out of the aformentioned range listed, please consider follow up with your Primary Care Provider.   ________________________________________________________  The [facility] providers would like to encourage you to use [facility] to communicate with providers for non-urgent requests or questions.  Due to long hold times on the telephone, sending your provider a message by [facility] may be a faster and more efficient way to get a response.  Please allow 48 business hours for a response.  Please remember that this is for non-urgent requests.  _______________________________________________________  Please go to the lab in the basement of our building to have lab work done as you leave today. Hit "B" for basement when you get on the elevator.  When the doors open the lab is on your left.  We will call you with the results. Thank you.  Thank you for entrusting me with your care and for choosing [facility], Dr. [place] Cellar

## 2021-04-23 NOTE — Progress Notes (Signed)
HPI :  87 year old female with a history of GERD, belching, B12 deficiency, anemia, here to reestablish care for some of the symptoms, referred by Dr. Cathlean Cower.  I last saw her in October 2016.  She is accompanied by her daughter today.  Patient has a longstanding history of GERD with symptoms of pyrosis and regurgitation.  States that if she watches her diet the symptoms really do not bother her much and she tries to avoid heavier greasy foods, and does pretty well at baseline.  She endorses rare nausea for which she takes Zofran as needed.  She states she takes that very seldomly.  She has a good appetite and eats 2 meals per day, lunch and dinner.  She often does not eat breakfast because she "sleeps in".  Her appetite is good.  They reports she has lost some weight over the past 2 years, perhaps 15 to 20 pounds, baseline weights in the 140s, down to 123's. Although states in recent months weight is more stable. Daughter is concerned that the patient is "intermittent fasting", as she only eats between noon and 6.  She does have some belching that bothers her at times, states this has been ongoing for a while.  She does have some dental issues which prohibits her from eating certain foods.  She denies dysphagia at baseline, able to eat her meals well.  She is prescribed Protonix which she states she rarely takes as her symptoms do not really bother her.  She denies any NSAID use.  She is has a chronic mild anemia, had a B12 level that was low within the past year or 2.  She does not take her B12 supplement routinely.  Overall patient states symptoms are minimal and she only would rather take medicines as needed if she does not have routine symptoms.  Daughter on the other hand states her mother frequently complains to her about symptoms and is concerned that the patient is minimizing her symptoms.  Otherwise patient has regular bowels at baseline without any blood in her stools.  In January she had a  UTI, around that time she had a few days worth of loose stools which resolved.  No blood in her stools.  She has tested negative for celiac disease in the past.  She has a remote history of an endoscopy remotely which she thinks was normal but we don't have the records.  I tried to obtain that report after our last visit and never showed up.   She had a normal colonoscopy in 2009 aside of melanosis coli.     Past Medical History:  Diagnosis Date   Alopecia    stress related and resolved since her Mother moved to SNF   GERD (gastroesophageal reflux disease)    takes Nexium daily   Glaucoma    uses eye drops daily   H/O hiatal hernia    Hypercholesteremia    takes Simvastatin daily   Pneumonia 1960     Past Surgical History:  Procedure Laterality Date   ABDOMINAL HYSTERECTOMY     fibroids.   COLONOSCOPY     KNEE ARTHROSCOPY WITH MEDIAL MENISECTOMY Left 09/12/2013   Procedure: KNEE ARTHROSCOPY WITH DEBRIDEMENT;  Surgeon: Meredith Pel, MD;  Location: [facility];  Service: Orthopedics;  Laterality: Left;   Family History  Problem Relation Age of Onset   COPD Neg Hx    Diabetes Neg Hx    Heart disease Neg Hx    Stroke Neg Hx  Colon cancer Neg Hx    Esophageal cancer Neg Hx    Pancreatic cancer Neg Hx    Stomach cancer Neg Hx    Social History   Tobacco Use   Smoking status: Never   Smokeless tobacco: Never  Vaping Use   Vaping Use: Never used  Substance Use Topics   Alcohol use: No   Drug use: No   Current Outpatient Medications  Medication Sig Dispense Refill   brimonidine-timolol (COMBIGAN) 0.2-0.5 % ophthalmic solution Place 1 drop into both eyes 2 (two) times daily.     brinzolamide (AZOPT) 1 % ophthalmic suspension Place 1 drop into both eyes 2 (two) times daily.     dorzolamide (TRUSOPT) 2 % ophthalmic solution      ondansetron (ZOFRAN) 4 MG tablet Take 1 tablet (4 mg total) by mouth every 8 (eight) hours as needed for nausea or vomiting. 30 tablet 2    pantoprazole (PROTONIX) 40 MG tablet Take 1 tablet (40 mg total) by mouth daily. (Patient taking differently: Take 40 mg by mouth daily. PRN) 90 tablet 3   tobramycin (TOBREX) 0.3 % ophthalmic solution      No current facility-administered medications for this visit.   Allergies  Allergen Reactions   Atorvastatin     REACTION: INTOL to Lipitor w/ leg pain   Penicillins     REACTION: hives     Review of Systems: All systems reviewed and negative except where noted in HPI.   Lab Results  Component Value Date   WBC 6.8 08/11/2020   HGB 11.4 (L) 08/11/2020   HCT 34.9 (L) 08/11/2020   MCV 88.4 08/11/2020   PLT 207 08/11/2020    Lab Results  Component Value Date   CREATININE 0.87 08/11/2020   BUN 13 08/11/2020   NA 135 08/11/2020   K 4.0 08/11/2020   CL 101 08/11/2020   CO2 25 08/11/2020    Lab Results  Component Value Date   ALT 8 08/07/2020   AST 15 08/07/2020   ALKPHOS 69 08/07/2020   BILITOT 0.5 08/07/2020   CBC Latest Ref Rng & Units 08/11/2020 08/07/2020 06/30/2019  WBC 4.0 - 10.5 K/uL 6.8 8.1 7.0  Hemoglobin 12.0 - 15.0 g/dL 11.4(L) 12.2 11.3(L)  Hematocrit 36.0 - 46.0 % 34.9(L) 36.6 34.2(L)  Platelets 150 - 400 K/uL 207 221.0 132.0(L)     Physical Exam: BP 112/72    Pulse 72    Ht 5\' 5"  (1.651 m)    Wt 123 lb 6 oz (56 kg)    BMI 20.53 kg/m  Constitutional: Pleasant,well-developed, female in no acute distress. HEENT: Normocephalic and atraumatic. Conjunctivae are normal. No scleral icterus. Neck supple.  Cardiovascular: Normal rate, regular rhythm.  Pulmonary/chest: Effort normal and breath sounds normal.  Abdominal: Soft, nondistended, nontender.  There are no masses palpable. No hepatomegaly. Extremities: no edema Lymphadenopathy: No cervical adenopathy noted. Neurological: Alert and oriented to person place and time. Skin: Skin is warm and dry. No rashes noted. Psychiatric: Normal mood and affect. Behavior is normal.   ASSESSMENT AND  PLAN: 87 year old female here for reassessment of the following:  GERD Nausea Belching Weight loss Anemia  Had a lengthy discussion with the patient and her daughter today about the symptoms.  Her symptoms appear chronic and have been ongoing for some time, patient states she manages it with diet and really only seldomly bothers her.  Daughter tells a different story that her mother frequently complains of symptoms of nausea, GERD/upset stomach.  Patient  states she does not really want to take any medications if she feels well, would rather use as needed which essentially she does not really take.  She has a chronic normocytic anemia with a low B12 level which could likely be related, she is not taking her B12 supplementation on a routine basis.  Had self-limited loose stools for few days in January but otherwise resolved.  We discussed options moving forward.  Weight loss seems to be stable in recent months and perhaps due to patient only eating 2 meals per day, encouraged her to eat all 3 meals per day and not restrict her diet.  We discussed how aggressive the patient and daughter wanted to be with measures.  To clarify how frequently the patient is having symptoms I asked both of them to keep a log to document frequency of symptoms.  If she is having symptoms frequently she should use her Protonix routinely and perhaps scheduled Zofran.  If it truly is seldom use then she can use as needed.  We discussed role of EGD, patient really does not want to undergo that if at all possible, following discussion of it.  Would reserve that for symptoms that persist despite medical therapy.  Daughter agrees however remains concerned that the patient is minimizing her symptoms, I will reach out to Dr. Jenny Reichmann to get his take on this.  Patient and daughter agree with the plan, if her symptoms progress, fail to improve with therapy, worsening weight loss etc. we will consider EGD. Otherwise will screen for iron  deficiency to make sure that is not playing a role in her anemia.  Plan: - lab for TIBC / ferritin - she should take B12 1083mcg / day - she will keep a log of symptoms so we can better clarify frequency - discussed using PPI routinely vs. PRN, she does not want to take it routinely yet. I think a trial of daily use for 4 weeks is reasonable if having symptoms multiple times per week - discussed role of EGD, patient wants to hold off for now - she needs to eat 3 meals per day and not restrict her diet - I will touch base with Dr Jenny Reichmann about her case given difference in history between daughter / patient  Jolly Mango, MD [facility]  CC: Biagio Borg, MD

## 2021-04-30 DIAGNOSIS — H31012 Macula scars of posterior pole (postinflammatory) (post-traumatic), left eye: Secondary | ICD-10-CM | POA: Diagnosis not present

## 2021-04-30 DIAGNOSIS — H43813 Vitreous degeneration, bilateral: Secondary | ICD-10-CM | POA: Diagnosis not present

## 2021-04-30 DIAGNOSIS — H35371 Puckering of macula, right eye: Secondary | ICD-10-CM | POA: Diagnosis not present

## 2021-04-30 DIAGNOSIS — H34811 Central retinal vein occlusion, right eye, with macular edema: Secondary | ICD-10-CM | POA: Diagnosis not present

## 2021-06-25 DIAGNOSIS — H34811 Central retinal vein occlusion, right eye, with macular edema: Secondary | ICD-10-CM | POA: Diagnosis not present

## 2021-06-25 DIAGNOSIS — H43813 Vitreous degeneration, bilateral: Secondary | ICD-10-CM | POA: Diagnosis not present

## 2021-06-25 DIAGNOSIS — H31012 Macula scars of posterior pole (postinflammatory) (post-traumatic), left eye: Secondary | ICD-10-CM | POA: Diagnosis not present

## 2021-06-25 DIAGNOSIS — H35371 Puckering of macula, right eye: Secondary | ICD-10-CM | POA: Diagnosis not present

## 2021-06-27 ENCOUNTER — Encounter: Payer: Self-pay | Admitting: Internal Medicine

## 2021-06-27 MED ORDER — ONDANSETRON HCL 4 MG PO TABS: 4.0000 mg | ORAL_TABLET | Freq: Three times a day (TID) | ORAL | 0 refills | Status: DC | PRN

## 2021-07-08 ENCOUNTER — Ambulatory Visit (INDEPENDENT_AMBULATORY_CARE_PROVIDER_SITE_OTHER): Payer: Medicare PPO | Admitting: Internal Medicine

## 2021-07-08 ENCOUNTER — Encounter: Payer: Self-pay | Admitting: Internal Medicine

## 2021-07-08 VITALS — BP 114/60 | HR 79 | Temp 98.6°F | Ht 65.0 in | Wt 125.0 lb

## 2021-07-08 DIAGNOSIS — Z0001 Encounter for general adult medical examination with abnormal findings: Secondary | ICD-10-CM

## 2021-07-08 DIAGNOSIS — E559 Vitamin D deficiency, unspecified: Secondary | ICD-10-CM | POA: Diagnosis not present

## 2021-07-08 DIAGNOSIS — R739 Hyperglycemia, unspecified: Secondary | ICD-10-CM

## 2021-07-08 DIAGNOSIS — E538 Deficiency of other specified B group vitamins: Secondary | ICD-10-CM

## 2021-07-08 DIAGNOSIS — E7800 Pure hypercholesterolemia, unspecified: Secondary | ICD-10-CM | POA: Diagnosis not present

## 2021-07-08 LAB — LIPID PANEL
Cholesterol: 230 mg/dL — ABNORMAL HIGH (ref 0–200)
HDL: 70 mg/dL (ref >39.00)
LDL Cholesterol: 133 mg/dL — ABNORMAL HIGH (ref 0–99)
NonHDL: 160.11
Total CHOL/HDL Ratio: 3
Triglycerides: 134 mg/dL (ref 0.0–149.0)
VLDL: 26.8 mg/dL (ref 0.0–40.0)

## 2021-07-08 LAB — CBC WITH DIFFERENTIAL/PLATELET
Basophils Absolute: 0.1 10*3/uL (ref 0.0–0.1)
Basophils Relative: 0.9 % (ref 0.0–3.0)
Eosinophils Absolute: 0.1 10*3/uL (ref 0.0–0.7)
Eosinophils Relative: 2 % (ref 0.0–5.0)
HCT: 33.1 % — ABNORMAL LOW (ref 36.0–46.0)
Hemoglobin: 11.1 g/dL — ABNORMAL LOW (ref 12.0–15.0)
Lymphocytes Relative: 30.9 % (ref 12.0–46.0)
Lymphs Abs: 2.2 10*3/uL (ref 0.7–4.0)
MCHC: 33.5 g/dL (ref 30.0–36.0)
MCV: 89.1 fl (ref 78.0–100.0)
Monocytes Absolute: 0.6 10*3/uL (ref 0.1–1.0)
Monocytes Relative: 8 % (ref 3.0–12.0)
Neutro Abs: 4.2 10*3/uL (ref 1.4–7.7)
Neutrophils Relative %: 58.2 % (ref 43.0–77.0)
Platelets: 221 10*3/uL (ref 150.0–400.0)
RBC: 3.72 Mil/uL — ABNORMAL LOW (ref 3.87–5.11)
RDW: 13.8 % (ref 11.5–15.5)
WBC: 7.3 10*3/uL (ref 4.0–10.5)

## 2021-07-08 LAB — BASIC METABOLIC PANEL
BUN: 14 mg/dL (ref 6–23)
CO2: 28 mEq/L (ref 19–32)
Calcium: 9.3 mg/dL (ref 8.4–10.5)
Chloride: 103 mEq/L (ref 96–112)
Creatinine, Ser: 0.91 mg/dL (ref 0.40–1.20)
GFR: 57.92 mL/min — ABNORMAL LOW (ref >60.00)
Glucose, Bld: 96 mg/dL (ref 70–99)
Potassium: 3.8 mEq/L (ref 3.5–5.1)
Sodium: 137 mEq/L (ref 135–145)

## 2021-07-08 LAB — HEPATIC FUNCTION PANEL
ALT: 9 U/L (ref 0–35)
AST: 16 U/L (ref 0–37)
Albumin: 4.1 g/dL (ref 3.5–5.2)
Alkaline Phosphatase: 76 U/L (ref 39–117)
Bilirubin, Direct: 0 mg/dL (ref 0.0–0.3)
Total Bilirubin: 0.4 mg/dL (ref 0.2–1.2)
Total Protein: 7.1 g/dL (ref 6.0–8.3)

## 2021-07-08 LAB — HEMOGLOBIN A1C: Hgb A1c MFr Bld: 5.5 % (ref 4.6–6.5)

## 2021-07-08 MED ORDER — ONDANSETRON HCL 4 MG PO TABS: 4.0000 mg | ORAL_TABLET | Freq: Three times a day (TID) | ORAL | 5 refills | Status: DC | PRN

## 2021-07-08 MED ORDER — PANTOPRAZOLE SODIUM 40 MG PO TBEC: 40.0000 mg | DELAYED_RELEASE_TABLET | Freq: Every day | ORAL | 3 refills | Status: DC

## 2021-07-08 NOTE — Assessment & Plan Note (Signed)
Lab Results  ?Component Value Date  ? VITAMINB12 166 (L) 08/07/2020  ? ?Low, to start oral replacement - b12 1000 mcg qd ? ?

## 2021-07-08 NOTE — Progress Notes (Signed)
Patient ID: Hannah Madden, female   DOB: 10/02/1934, 86 y.o.   MRN: [id number] ? ? ? ?     Chief Complaint:: wellness exam and low vit d, low B12 and hld ? ?     HPI:  Hannah Madden is a 87 y.o. female here for wellness exam; declines dxa, shingrix, pneumovax o/w up to date ?         ?              Also not taking Vit d and B12.  Not interested in statin for now.  Pt denies chest pain, increased sob or doe, wheezing, orthopnea, PND, increased LE swelling, palpitations, dizziness or syncope.   Pt denies polydipsia, polyuria, or new focal neuro s/s.    Pt denies fever, wt loss, night sweats, loss of appetite, or other constitutional symptoms   ?  ?Wt Readings from Last 3 Encounters:  ?07/08/21 125 lb (56.7 kg)  ?04/23/21 123 lb 6 oz (56 kg)  ?12/24/20 122 lb (55.3 kg)  ? ?BP Readings from Last 3 Encounters:  ?07/08/21 114/60  ?04/23/21 112/72  ?12/24/20 118/60  ? ?Immunization History  ?Administered Date(s) Administered  ? Fluad Quad(high Dose 65+) 12/23/2019, 12/24/2020  ? Influenza, High Dose Seasonal PF 01/10/2014, 02/14/2016, 01/23/2017, 11/05/2018  ? Influenza,inj,Quad PF,6+ Mos 12/07/2014  ? Influenza-Unspecified 12/02/2015  ? Moderna Sars-Covid-2 Vaccination 05/02/2019, 06/01/2019  ? PFIZER Comirnaty(Gray Top)Covid-19 Tri-Sucrose Vaccine 08/07/2020  ? PFIZER(Purple Top)SARS-COV-2 Vaccination 02/04/2020  ? Pension scheme manager 61yr & up 12/31/2020  ? Td 07/14/2008  ? Tdap 09/08/2017  ? ?There are no preventive care reminders to display for this patient. ? ?  ? ?Past Medical History:  ?Diagnosis Date  ? Alopecia   ? stress related and resolved since her Mother moved to SNF  ? GERD (gastroesophageal reflux disease)   ? takes Nexium daily  ? Glaucoma   ? uses eye drops daily  ? H/O hiatal hernia   ? Hypercholesteremia   ? takes Simvastatin daily  ? Pneumonia 1960  ? ?Past Surgical History:  ?Procedure Laterality Date  ? ABDOMINAL HYSTERECTOMY    ? fibroids.  ? COLONOSCOPY    ? KNEE  ARTHROSCOPY WITH MEDIAL MENISECTOMY Left 09/12/2013  ? Procedure: KNEE ARTHROSCOPY WITH DEBRIDEMENT;  Surgeon: GMeredith Pel MD;  Location: [facility]  Service: Orthopedics;  Laterality: Left;  ? ? reports that she has never smoked. She has never used smokeless tobacco. She reports that she does not drink alcohol and does not use drugs. ?family history is not on file. ?Allergies  ?Allergen Reactions  ? Atorvastatin   ?  REACTION: INTOL to Lipitor w/ leg pain  ? Penicillins   ?  REACTION: hives  ? ?Current Outpatient Medications on File Prior to Visit  ?Medication Sig Dispense Refill  ? brimonidine-timolol (COMBIGAN) 0.2-0.5 % ophthalmic solution Place 1 drop into both eyes 2 (two) times daily.    ? brinzolamide (AZOPT) 1 % ophthalmic suspension Place 1 drop into both eyes 2 (two) times daily.    ? tobramycin (TOBREX) 0.3 % ophthalmic solution     ? ?No current facility-administered medications on file prior to visit.  ? ?     ROS:  All others reviewed and negative. ? ?Objective  ? ?     PE:  BP 114/60 (BP Location: Left Arm, Patient Position: Sitting, Cuff Size: Large)   Pulse 79   Temp 98.6 ?F (37 ?C) (Oral)   Ht '5\' 5"'$  (  1.651 m)   Wt 125 lb (56.7 kg)   SpO2 100%   BMI 20.80 kg/m?  ? ?              Constitutional: Pt appears in NAD ?              HENT: Head: NCAT.  ?              Right Ear: External ear normal.   ?              Left Ear: External ear normal.  ?              Eyes: . Pupils are equal, round, and reactive to light. Conjunctivae and EOM are normal ?              Nose: without d/c or deformity ?              Neck: Neck supple. Gross normal ROM ?              Cardiovascular: Normal rate and regular rhythm.   ?              Pulmonary/Chest: Effort normal and breath sounds without rales or wheezing.  ?              Abd:  Soft, NT, ND, + BS, no organomegaly ?              Neurological: Pt is alert. At baseline orientation, motor grossly intact ?              Skin: Skin is warm. No rashes, no other new  lesions, LE edema - none ?              Psychiatric: Pt behavior is normal without agitation  ? ?Micro: none ? ?Cardiac tracings I have personally interpreted today:  none ? ?Pertinent Radiological findings (summarize): none  ? ?Lab Results  ?Component Value Date  ? WBC 7.3 07/08/2021  ? HGB 11.1 (L) 07/08/2021  ? HCT 33.1 (L) 07/08/2021  ? PLT 221.0 07/08/2021  ? GLUCOSE 96 07/08/2021  ? CHOL 230 (H) 07/08/2021  ? TRIG 134.0 07/08/2021  ? HDL 70.00 07/08/2021  ? LDLDIRECT 152.0 10/25/2015  ? LDLCALC 133 (H) 07/08/2021  ? ALT 9 07/08/2021  ? AST 16 07/08/2021  ? NA 137 07/08/2021  ? K 3.8 07/08/2021  ? CL 103 07/08/2021  ? CREATININE 0.91 07/08/2021  ? BUN 14 07/08/2021  ? CO2 28 07/08/2021  ? TSH 1.31 07/08/2021  ? HGBA1C 5.5 07/08/2021  ? ?Assessment/Plan:  ?Hannah Madden is a 87 y.o. Black or African American [2] female with  has a past medical history of Alopecia, GERD (gastroesophageal reflux disease), Glaucoma, H/O hiatal hernia, Hypercholesteremia, and Pneumonia (1960). ? ?B12 deficiency ?Lab Results  ?Component Value Date  ? VITAMINB12 166 (L) 08/07/2020  ? ?Low, to start oral replacement - b12 1000 mcg qd ? ? ?Vitamin D deficiency ?Last vitamin D ?Lab Results  ?Component Value Date  ? VD25OH 25.13 (L) 08/07/2020  ? ?Low, to start oral replacement ? ? ?Encounter for well adult exam with abnormal findings ?Age and sex appropriate education and counseling updated with regular exercise and diet ?Referrals for preventative services - declines dxa ?Immunizations addressed - declines shingrix, pneumovax ?Smoking counseling  - none needed ?Evidence for depression or other mood disorder - none significant ?Most recent labs reviewed. ?I have personally reviewed and have noted: ?1) the patient's  medical and social history ?2) The patient's current medications and supplements ?3) The patient's height, weight, and BMI have been recorded in the chart ? ? ?HYPERCHOLESTEROLEMIA ?Lab Results  ?Component Value Date  ?  [place] 133 (H) 07/08/2021  ? ?uncontrolled, pt to continue current low chol diet - declines statin ? ? ?Hyperglycemia ?Lab Results  ?Component Value Date  ? HGBA1C 5.5 07/08/2021  ? ?Stable, pt to continue current medical treatment  - diet ? ?Followup: Return in about 6 months (around 01/08/2022). ? ?Cathlean Cower, MD 07/11/2021 8:46 PM ?[place] ?[place] ?Internal Medicine ?

## 2021-07-08 NOTE — Patient Instructions (Addendum)
Please check the insurance about the Shingrix shingles shot ? ?Please take OTC Vitamin D3 at 2000 units per day, indefinitely ? ?Ok to also take the OTC B12 1000 mcg per day for at least 6 months ? ?Please continue all other medications as before, and refills have been done if requested. ? ?Please have the pharmacy call with any other refills you may need. ? ?Please continue your efforts at being more active, low cholesterol diet, and weight control. ? ?You are otherwise up to date with prevention measures today. ? ?Please keep your appointments with your specialists as you may have planned ? ?Please go to the LAB at the blood drawing area for the tests to be done ? ?You will be contacted by phone if any changes need to be made immediately.  Otherwise, you will receive a letter about your results with an explanation, but please check with MyChart first. ? ?Please remember to sign up for MyChart if you have not done so, as this will be important to you in the future with finding out test results, communicating by private email, and scheduling acute appointments online when needed. ? ?Please make an Appointment to return in 6 months, or sooner if needed ?

## 2021-07-08 NOTE — Assessment & Plan Note (Signed)
Last vitamin D ?Lab Results  ?Component Value Date  ? VD25OH 25.13 (L) 08/07/2020  ? ?Low, to start oral replacement ? ?

## 2021-07-09 ENCOUNTER — Other Ambulatory Visit: Payer: Self-pay | Admitting: Internal Medicine

## 2021-07-09 LAB — URINALYSIS, ROUTINE W REFLEX MICROSCOPIC
Bilirubin Urine: NEGATIVE
Ketones, ur: NEGATIVE
Nitrite: NEGATIVE
Specific Gravity, Urine: 1.01 (ref 1.000–1.030)
Total Protein, Urine: NEGATIVE
Urine Glucose: NEGATIVE
Urobilinogen, UA: 0.2 (ref 0.0–1.0)
pH: 6 (ref 5.0–8.0)

## 2021-07-09 LAB — VITAMIN D 25 HYDROXY (VIT D DEFICIENCY, FRACTURES): VITD: 15.07 ng/mL — ABNORMAL LOW (ref 30.00–100.00)

## 2021-07-09 LAB — TSH: TSH: 1.31 u[IU]/mL (ref 0.35–5.50)

## 2021-07-09 LAB — VITAMIN B12: Vitamin B-12: 153 pg/mL — ABNORMAL LOW (ref 211–911)

## 2021-07-09 MED ORDER — CIPROFLOXACIN HCL 500 MG PO TABS: 500.0000 mg | ORAL_TABLET | Freq: Two times a day (BID) | ORAL | 0 refills | Status: DC

## 2021-07-11 ENCOUNTER — Encounter: Payer: Self-pay | Admitting: Internal Medicine

## 2021-07-11 NOTE — Assessment & Plan Note (Signed)
Lab Results  ?Component Value Date  ? HGBA1C 5.5 07/08/2021  ? ?Stable, pt to continue current medical treatment  - diet ? ?

## 2021-07-11 NOTE — Assessment & Plan Note (Signed)
Age and sex appropriate education and counseling updated with regular exercise and diet ?Referrals for preventative services - declines dxa ?Immunizations addressed - declines shingrix, pneumovax ?Smoking counseling  - none needed ?Evidence for depression or other mood disorder - none significant ?Most recent labs reviewed. ?I have personally reviewed and have noted: ?1) the patient's medical and social history ?2) The patient's current medications and supplements ?3) The patient's height, weight, and BMI have been recorded in the chart ? ?

## 2021-07-11 NOTE — Assessment & Plan Note (Signed)
Lab Results  ?Component Value Date  ? [place] 133 (H) 07/08/2021  ? ?uncontrolled, pt to continue current low chol diet - declines statin ? ?

## 2021-07-13 DIAGNOSIS — R21 Rash and other nonspecific skin eruption: Secondary | ICD-10-CM | POA: Diagnosis not present

## 2021-07-16 ENCOUNTER — Ambulatory Visit: Payer: Medicare PPO | Admitting: Internal Medicine

## 2021-07-19 ENCOUNTER — Encounter: Payer: Self-pay | Admitting: Internal Medicine

## 2021-07-19 ENCOUNTER — Ambulatory Visit (INDEPENDENT_AMBULATORY_CARE_PROVIDER_SITE_OTHER): Payer: Medicare PPO | Admitting: Internal Medicine

## 2021-07-19 VITALS — BP 122/72 | HR 63 | Temp 98.9°F | Ht 65.0 in | Wt 122.0 lb

## 2021-07-19 DIAGNOSIS — R739 Hyperglycemia, unspecified: Secondary | ICD-10-CM

## 2021-07-19 DIAGNOSIS — N390 Urinary tract infection, site not specified: Secondary | ICD-10-CM | POA: Diagnosis not present

## 2021-07-19 DIAGNOSIS — L270 Generalized skin eruption due to drugs and medicaments taken internally: Secondary | ICD-10-CM

## 2021-07-19 DIAGNOSIS — R197 Diarrhea, unspecified: Secondary | ICD-10-CM | POA: Diagnosis not present

## 2021-07-19 DIAGNOSIS — R350 Frequency of micturition: Secondary | ICD-10-CM

## 2021-07-19 LAB — URINALYSIS, ROUTINE W REFLEX MICROSCOPIC
Bilirubin Urine: NEGATIVE
Hgb urine dipstick: NEGATIVE
Ketones, ur: NEGATIVE
Leukocytes,Ua: NEGATIVE
Nitrite: NEGATIVE
Specific Gravity, Urine: 1.01 (ref 1.000–1.030)
Total Protein, Urine: NEGATIVE
Urine Glucose: NEGATIVE
Urobilinogen, UA: 0.2 (ref 0.0–1.0)
pH: 6 (ref 5.0–8.0)

## 2021-07-19 NOTE — Assessment & Plan Note (Signed)
Improved, to finish prednisone asd,  to f/u any worsening symptoms or concerns

## 2021-07-19 NOTE — Assessment & Plan Note (Signed)
Pt for f/u urine studies today, suspect this will prove negative

## 2021-07-19 NOTE — Assessment & Plan Note (Signed)
Unfortunately unable to tolerate macrobid as well, d/c macrobid and f/u urine studies, hopefully to avoid further antibx for now

## 2021-07-19 NOTE — Patient Instructions (Addendum)
Please to finish the prednisone  OK to not take Any further Cipro or Macrobid in the future  Please continue all other medications as before, and refills have been done if requested.  Please have the pharmacy call with any other refills you may need.  Please keep your appointments with your specialists as you may have planned  Please consider having the Shingles shots done at your local pharmacy if ok with the insurance  Please go to the LAB at the blood drawing area for the tests to be done - just the urine testing today  You will be contacted by phone if any changes need to be made immediately.  Otherwise, you will receive a letter about your results with an explanation, but please check with MyChart first.  Please remember to sign up for MyChart if you have not done so, as this will be important to you in the future with finding out test results, communicating by private email, and scheduling acute appointments online when needed.

## 2021-07-19 NOTE — Assessment & Plan Note (Signed)
Lab Results  Component Value Date   HGBA1C 5.5 07/08/2021   Stable, pt to continue current medical treatment  - diet

## 2021-07-19 NOTE — Progress Notes (Signed)
Patient ID: Hannah Madden, female   DOB: October 10, 1934, 87 y.o.   MRN: [id number]        Chief Complaint: follow up recent uti, rash from cipro, diarrhea with macrobid       HPI:  Hannah Madden is a 87 y.o. female here with family, tx with cipro course after last visit with UTI, unfortuantely complicated by allergic rash, seen at [facility] treated with prednisone course, and antibx changed to macrobid but unfortuantely had diarrhea with that after 2 days so stopped.  Denies urinary symptoms such as dysuria, frequency, urgency, flank pain, hematuria or n/v, fever, chills, but had no symptoms to start, and family just wanting to be sure urine rechecked today.  Pt denies chest pain, increased sob or doe, wheezing, orthopnea, PND, increased LE swelling, palpitations, dizziness or syncope.   Pt denies polydipsia, polyuria, or new focal neuro s/s.   Pt denies fever, wt loss, night sweats, loss of appetite, or other constitutional symptoms         Wt Readings from Last 3 Encounters:  07/19/21 122 lb (55.3 kg)  07/08/21 125 lb (56.7 kg)  04/23/21 123 lb 6 oz (56 kg)   BP Readings from Last 3 Encounters:  07/19/21 122/72  07/08/21 114/60  04/23/21 112/72         Past Medical History:  Diagnosis Date   Alopecia    stress related and resolved since her Mother moved to SNF   GERD (gastroesophageal reflux disease)    takes Nexium daily   Glaucoma    uses eye drops daily   H/O hiatal hernia    Hypercholesteremia    takes Simvastatin daily   Pneumonia 1960   Past Surgical History:  Procedure Laterality Date   ABDOMINAL HYSTERECTOMY     fibroids.   COLONOSCOPY     KNEE ARTHROSCOPY WITH MEDIAL MENISECTOMY Left 09/12/2013   Procedure: KNEE ARTHROSCOPY WITH DEBRIDEMENT;  Surgeon: Meredith Pel, MD;  Location: [place];  Service: Orthopedics;  Laterality: Left;    reports that she has never smoked. She has never used smokeless tobacco. She reports that she does not drink alcohol and does not use  drugs. family history is not on file. Allergies  Allergen Reactions   Atorvastatin     REACTION: INTOL to Lipitor w/ leg pain   Macrobid [Nitrofurantoin] Other (See Comments)    diarrhea   Penicillins     REACTION: hives   Ciprofloxacin Rash   Current Outpatient Medications on File Prior to Visit  Medication Sig Dispense Refill   brimonidine-timolol (COMBIGAN) 0.2-0.5 % ophthalmic solution Place 1 drop into both eyes 2 (two) times daily.     brinzolamide (AZOPT) 1 % ophthalmic suspension Place 1 drop into both eyes 2 (two) times daily.     ondansetron (ZOFRAN) 4 MG tablet Take 1 tablet (4 mg total) by mouth every 8 (eight) hours as needed for nausea or vomiting. 30 tablet 5   pantoprazole (PROTONIX) 40 MG tablet Take 1 tablet (40 mg total) by mouth daily. 90 tablet 3   predniSONE (STERAPRED UNI-PAK 21 TAB) 10 MG (21) TBPK tablet See admin instructions. follow package directions     tobramycin (TOBREX) 0.3 % ophthalmic solution      triamcinolone cream (KENALOG) 0.5 % Apply topically 4 (four) times daily.     No current facility-administered medications on file prior to visit.        ROS:  All others reviewed and negative.  Objective  PE:  BP 122/72 (BP Location: Right Arm, Patient Position: Sitting, Cuff Size: Normal)   Pulse 63   Temp 98.9 F (37.2 C) (Oral)   Ht '5\' 5"'$  (1.651 m)   Wt 122 lb (55.3 kg)   SpO2 100%   BMI 20.30 kg/m                 Constitutional: Pt appears in NAD               HENT: Head: NCAT.                Right Ear: External ear normal.                 Left Ear: External ear normal.                Eyes: . Pupils are equal, round, and reactive to light. Conjunctivae and EOM are normal               Nose: without d/c or deformity               Neck: Neck supple. Gross normal ROM               Cardiovascular: Normal rate and regular rhythm.                 Pulmonary/Chest: Effort normal and breath sounds without rales or wheezing.                 Abd:  Soft, NT, ND, + BS, no organomegaly               Neurological: Pt is alert. At baseline orientation, motor grossly intact               Skin: Skin is warm. No rashes, no other new lesions, LE edema - none               Psychiatric: Pt behavior is normal without agitation   Micro: none  Cardiac tracings I have personally interpreted today:  none  Pertinent Radiological findings (summarize): none   Lab Results  Component Value Date   WBC 7.3 07/08/2021   HGB 11.1 (L) 07/08/2021   HCT 33.1 (L) 07/08/2021   PLT 221.0 07/08/2021   GLUCOSE 96 07/08/2021   CHOL 230 (H) 07/08/2021   TRIG 134.0 07/08/2021   HDL 70.00 07/08/2021   LDLDIRECT 152.0 10/25/2015   LDLCALC 133 (H) 07/08/2021   ALT 9 07/08/2021   AST 16 07/08/2021   NA 137 07/08/2021   K 3.8 07/08/2021   CL 103 07/08/2021   CREATININE 0.91 07/08/2021   BUN 14 07/08/2021   CO2 28 07/08/2021   TSH 1.31 07/08/2021   HGBA1C 5.5 07/08/2021   Assessment/Plan:  Hannah Madden is a 87 y.o. Black or African American [2] female with  has a past medical history of Alopecia, GERD (gastroesophageal reflux disease), Glaucoma, H/O hiatal hernia, Hypercholesteremia, and Pneumonia (1960).  Urinary frequency Pt for f/u urine studies today, suspect this will prove negative  Allergic drug rash Improved, to finish prednisone asd,  to f/u any worsening symptoms or concerns  Diarrhea Unfortunately unable to tolerate macrobid as well, d/c macrobid and f/u urine studies, hopefully to avoid further antibx for now  Hyperglycemia Lab Results  Component Value Date   HGBA1C 5.5 07/08/2021   Stable, pt to continue current medical treatment  - diet  Followup: Return if symptoms worsen or fail to improve.  Cathlean Cower, MD 07/19/2021 9:34 PM [facility]

## 2021-07-20 LAB — URINE CULTURE: Result:: NO GROWTH

## 2021-08-06 DIAGNOSIS — H34811 Central retinal vein occlusion, right eye, with macular edema: Secondary | ICD-10-CM | POA: Diagnosis not present

## 2021-09-17 DIAGNOSIS — H35371 Puckering of macula, right eye: Secondary | ICD-10-CM | POA: Diagnosis not present

## 2021-09-17 DIAGNOSIS — H34811 Central retinal vein occlusion, right eye, with macular edema: Secondary | ICD-10-CM | POA: Diagnosis not present

## 2021-09-17 DIAGNOSIS — H31012 Macula scars of posterior pole (postinflammatory) (post-traumatic), left eye: Secondary | ICD-10-CM | POA: Diagnosis not present

## 2021-09-17 DIAGNOSIS — H43813 Vitreous degeneration, bilateral: Secondary | ICD-10-CM | POA: Diagnosis not present

## 2021-10-30 DIAGNOSIS — H43813 Vitreous degeneration, bilateral: Secondary | ICD-10-CM | POA: Diagnosis not present

## 2021-10-30 DIAGNOSIS — H34811 Central retinal vein occlusion, right eye, with macular edema: Secondary | ICD-10-CM | POA: Diagnosis not present

## 2021-10-30 DIAGNOSIS — H31012 Macula scars of posterior pole (postinflammatory) (post-traumatic), left eye: Secondary | ICD-10-CM | POA: Diagnosis not present

## 2021-10-30 DIAGNOSIS — H35371 Puckering of macula, right eye: Secondary | ICD-10-CM | POA: Diagnosis not present

## 2021-11-01 DIAGNOSIS — Z1152 Encounter for screening for COVID-19: Secondary | ICD-10-CM | POA: Diagnosis not present

## 2021-11-01 DIAGNOSIS — U071 COVID-19: Secondary | ICD-10-CM | POA: Diagnosis not present

## 2021-11-05 ENCOUNTER — Encounter: Payer: Self-pay | Admitting: Internal Medicine

## 2021-11-12 ENCOUNTER — Ambulatory Visit: Payer: Medicare PPO | Admitting: Internal Medicine

## 2021-12-04 DIAGNOSIS — H35371 Puckering of macula, right eye: Secondary | ICD-10-CM | POA: Diagnosis not present

## 2021-12-04 DIAGNOSIS — H43813 Vitreous degeneration, bilateral: Secondary | ICD-10-CM | POA: Diagnosis not present

## 2021-12-04 DIAGNOSIS — H34811 Central retinal vein occlusion, right eye, with macular edema: Secondary | ICD-10-CM | POA: Diagnosis not present

## 2021-12-04 DIAGNOSIS — H31012 Macula scars of posterior pole (postinflammatory) (post-traumatic), left eye: Secondary | ICD-10-CM | POA: Diagnosis not present

## 2021-12-16 ENCOUNTER — Telehealth: Payer: Self-pay | Admitting: Internal Medicine

## 2021-12-16 NOTE — Telephone Encounter (Signed)
LVM for pt to rtn my call to schedule AWV with [facility] call back # [phone] 

## 2021-12-18 ENCOUNTER — Ambulatory Visit: Payer: Self-pay | Admitting: Licensed Clinical Social Worker

## 2021-12-18 NOTE — Patient Instructions (Signed)
Visit Information  Thank you for taking time to visit with me today. Please don't hesitate to contact me if I can be of assistance to you.   Following are the goals we discussed today:   Goals Addressed             This Visit's Progress    COMPLETED: Care Coordination Activities No Follow up Required       Care Coordination Interventions: Reviewed Care Coordination Services:Declined Discussed benefits of Medicare Annual Wellness Visit: Declined          No further follow up required: by [place] team works in collaboration with your primary care doctor.  Please call [phone] if you would like to schedule a phone appointment   1.The Care Coordination services include support from the care team which includes a Nurse Coordinator, Clinical Social Worker, or Pharmacist, to assist with navigating your physical and mental health needs. Marland Kitchen  2.The Care Coordination team is here to help remove barriers to health concerns and goals most important to you.  3.Care Coordination services are voluntary, you may decline or stop services at any time by request to the care team member   Casimer Lanius, [place] [phone]

## 2021-12-18 NOTE — Patient Outreach (Signed)
  Care Coordination   Initial Visit Note   12/18/2021 Name: Hannah Madden MRN: [id number] DOB: February 28, 1935  Hannah Madden is a 87 y.o. year old female who sees Biagio Borg, MD for primary care. I spoke with  Lorne Skeens by phone today.  What matters to the patients health and wellness today?    Patient reports no concerns or needs from Care Coordination team with health and wellness related to physical or mental heath. .   Recommendation: Patient may benefit from, Medicare AWV however she declined at this time     Goals Addressed             This Visit's Progress    COMPLETED: Care Coordination Activities No Follow up Required       Care Coordination Interventions: Reviewed Care Coordination Services:Declined Discussed benefits of Medicare Annual Wellness Visit: Declined          SDOH assessments and interventions completed:  No    Care Coordination Interventions Activated:  Yes  Care Coordination Interventions:  Yes, provided   Follow up plan: No further intervention required.   Encounter Outcome:  Pt. Visit Completed   Casimer Lanius, [place] [phone]

## 2021-12-19 ENCOUNTER — Telehealth: Payer: Self-pay | Admitting: Internal Medicine

## 2021-12-19 NOTE — Telephone Encounter (Signed)
LVM for pt's daughter to rtn my call to schedule AWV with [facility] call back # [phone]

## 2022-01-08 ENCOUNTER — Ambulatory Visit (INDEPENDENT_AMBULATORY_CARE_PROVIDER_SITE_OTHER): Payer: Medicare PPO | Admitting: Internal Medicine

## 2022-01-08 VITALS — BP 128/72 | HR 80 | Temp 99.3°F | Ht 65.0 in | Wt 125.0 lb

## 2022-01-08 DIAGNOSIS — Z1231 Encounter for screening mammogram for malignant neoplasm of breast: Secondary | ICD-10-CM | POA: Diagnosis not present

## 2022-01-08 DIAGNOSIS — E559 Vitamin D deficiency, unspecified: Secondary | ICD-10-CM | POA: Diagnosis not present

## 2022-01-08 DIAGNOSIS — E7800 Pure hypercholesterolemia, unspecified: Secondary | ICD-10-CM | POA: Diagnosis not present

## 2022-01-08 DIAGNOSIS — F411 Generalized anxiety disorder: Secondary | ICD-10-CM

## 2022-01-08 DIAGNOSIS — E538 Deficiency of other specified B group vitamins: Secondary | ICD-10-CM

## 2022-01-08 MED ORDER — ONDANSETRON HCL 4 MG PO TABS: 4.0000 mg | ORAL_TABLET | Freq: Three times a day (TID) | ORAL | 5 refills | Status: DC | PRN

## 2022-01-08 MED ORDER — HYDROXYZINE HCL 10 MG PO TABS: 10.0000 mg | ORAL_TABLET | Freq: Three times a day (TID) | ORAL | 2 refills | Status: DC | PRN

## 2022-01-08 MED ORDER — TRIAMCINOLONE ACETONIDE 0.5 % EX CREA: TOPICAL_CREAM | Freq: Four times a day (QID) | CUTANEOUS | 1 refills | Status: DC

## 2022-01-08 NOTE — Progress Notes (Unsigned)
Patient ID: Hannah Madden, female   DOB: 1934/10/21, 87 y.o.   MRN: [id number]        Chief Complaint: follow up anxiety, hld, low vit d, b12 deficiency       HPI:  Hannah Madden is a 87 y.o. female here with c/o increased anxiety now mod to severe at times with tearfulness and near panic.  Pt denies chest pain, increased sob or doe, wheezing, orthopnea, PND, increased LE swelling, palpitations, dizziness or syncope.   Pt denies polydipsia, polyuria, or new focal neuro s/s.    Pt denies fever, wt loss, night sweats, loss of appetite, or other constitutional symptoms    Plans to have shingrix at  pharmacy,  Due for mammogram.  Not currently taking statin, declines for now.   Wt Readings from Last 3 Encounters:  01/08/22 125 lb (56.7 kg)  07/19/21 122 lb (55.3 kg)  07/08/21 125 lb (56.7 kg)   BP Readings from Last 3 Encounters:  01/08/22 128/72  07/19/21 122/72  07/08/21 114/60         Past Medical History:  Diagnosis Date   Alopecia    stress related and resolved since her Mother moved to SNF   GERD (gastroesophageal reflux disease)    takes Nexium daily   Glaucoma    uses eye drops daily   H/O hiatal hernia    Hypercholesteremia    takes Simvastatin daily   Pneumonia 1960   Past Surgical History:  Procedure Laterality Date   ABDOMINAL HYSTERECTOMY     fibroids.   COLONOSCOPY     KNEE ARTHROSCOPY WITH MEDIAL MENISECTOMY Left 09/12/2013   Procedure: KNEE ARTHROSCOPY WITH DEBRIDEMENT;  Surgeon: Meredith Pel, MD;  Location: [place];  Service: Orthopedics;  Laterality: Left;    reports that she has never smoked. She has never used smokeless tobacco. She reports that she does not drink alcohol and does not use drugs. family history is not on file. Allergies  Allergen Reactions   Atorvastatin     REACTION: INTOL to Lipitor w/ leg pain   Macrobid [Nitrofurantoin] Other (See Comments)    diarrhea   Penicillins     REACTION: hives   Ciprofloxacin Rash   Current  Outpatient Medications on File Prior to Visit  Medication Sig Dispense Refill   brimonidine-timolol (COMBIGAN) 0.2-0.5 % ophthalmic solution Place 1 drop into both eyes 2 (two) times daily.     brinzolamide (AZOPT) 1 % ophthalmic suspension Place 1 drop into both eyes 2 (two) times daily.     pantoprazole (PROTONIX) 40 MG tablet Take 1 tablet (40 mg total) by mouth daily. 90 tablet 3   predniSONE (STERAPRED UNI-PAK 21 TAB) 10 MG (21) TBPK tablet See admin instructions. follow package directions     tobramycin (TOBREX) 0.3 % ophthalmic solution      No current facility-administered medications on file prior to visit.        ROS:  All others reviewed and negative.  Objective        PE:  BP 128/72 (BP Location: Left Arm, Patient Position: Sitting, Cuff Size: Large)   Pulse 80   Temp 99.3 F (37.4 C) (Oral)   Ht '5\' 5"'$  (1.651 m)   Wt 125 lb (56.7 kg)   SpO2 97%   BMI 20.80 kg/m                 Constitutional: Pt appears in NAD  HENT: Head: NCAT.                Right Ear: External ear normal.                 Left Ear: External ear normal.                Eyes: . Pupils are equal, round, and reactive to light. Conjunctivae and EOM are normal               Nose: without d/c or deformity               Neck: Neck supple. Gross normal ROM               Cardiovascular: Normal rate and regular rhythm.                 Pulmonary/Chest: Effort normal and breath sounds without rales or wheezing.                Abd:  Soft, NT, ND, + BS, no organomegaly               Neurological: Pt is alert. At baseline orientation, motor grossly intact               Skin: Skin is warm. No rashes, no other new lesions, LE edema - none               Psychiatric: Pt behavior is normal without agitation   Micro: none  Cardiac tracings I have personally interpreted today:  none  Pertinent Radiological findings (summarize): none   Lab Results  Component Value Date   WBC 7.3 07/08/2021   HGB 11.1  (L) 07/08/2021   HCT 33.1 (L) 07/08/2021   PLT 221.0 07/08/2021   GLUCOSE 96 07/08/2021   CHOL 230 (H) 07/08/2021   TRIG 134.0 07/08/2021   HDL 70.00 07/08/2021   LDLDIRECT 152.0 10/25/2015   LDLCALC 133 (H) 07/08/2021   ALT 9 07/08/2021   AST 16 07/08/2021   NA 137 07/08/2021   K 3.8 07/08/2021   CL 103 07/08/2021   CREATININE 0.91 07/08/2021   BUN 14 07/08/2021   CO2 28 07/08/2021   TSH 1.31 07/08/2021   HGBA1C 5.5 07/08/2021   Assessment/Plan:  Hannah Madden is a 87 y.o. Black or African American [2] female with  has a past medical history of Alopecia, GERD (gastroesophageal reflux disease), Glaucoma, H/O hiatal hernia, Hypercholesteremia, and Pneumonia (1960).  Vitamin D deficiency Last vitamin D Lab Results  Component Value Date   VD25OH 15.07 (L) 07/08/2021   Low, reminded to start oral replacement   B12 deficiency Lab Results  Component Value Date   VITAMINB12 153 (L) 07/08/2021   Low, reminded to start oral replacement - b12 1000 mcg qd   HYPERCHOLESTEROLEMIA Lab Results  Component Value Date   LDLCALC 133 (H) 07/08/2021   Uncontrolled, goal ldl < 100, pt for lower chol diet, declines statin or other tx for now   Anxiety state Mod to severe recently, for atarax 10 tid prn,  to f/u any worsening symptoms or concerns  Followup: Return in about 6 months (around 07/09/2022).  Cathlean Cower, MD 01/09/2022 8:19 PM [facility]

## 2022-01-08 NOTE — Patient Instructions (Addendum)
Please take all new medication as prescribed - the hydroxyzine 10 mg as needed for nervousness  You will be contacted regarding the referral for: mammogram  Please have your Shingrix (shingles) shots done at your local pharmacy.  Please continue all other medications as before, and refills have been done if requested.  Please have the pharmacy call with any other refills you may need.  Please continue your efforts at being more active, low cholesterol diet, and weight control  Please keep your appointments with your specialists as you may have planned  We can hold on lab testing for now  Please make an Appointment to return in 6 months, or sooner if needed

## 2022-01-09 ENCOUNTER — Encounter: Payer: Self-pay | Admitting: Internal Medicine

## 2022-01-09 NOTE — Assessment & Plan Note (Signed)
Last vitamin D Lab Results  Component Value Date   VD25OH 15.07 (L) 07/08/2021   Low, reminded to start oral replacement

## 2022-01-09 NOTE — Assessment & Plan Note (Signed)
Lab Results  Component Value Date   VITAMINB12 153 (L) 07/08/2021   Low, reminded to start oral replacement - b12 1000 mcg qd

## 2022-01-09 NOTE — Assessment & Plan Note (Signed)
Mod to severe recently, for atarax 10 tid prn,  to f/u any worsening symptoms or concerns

## 2022-01-09 NOTE — Assessment & Plan Note (Signed)
Lab Results  Component Value Date   LDLCALC 133 (H) 07/08/2021   Uncontrolled, goal ldl < 100, pt for lower chol diet, declines statin or other tx for now

## 2022-01-14 DIAGNOSIS — H31012 Macula scars of posterior pole (postinflammatory) (post-traumatic), left eye: Secondary | ICD-10-CM | POA: Diagnosis not present

## 2022-01-14 DIAGNOSIS — H35371 Puckering of macula, right eye: Secondary | ICD-10-CM | POA: Diagnosis not present

## 2022-01-14 DIAGNOSIS — H34811 Central retinal vein occlusion, right eye, with macular edema: Secondary | ICD-10-CM | POA: Diagnosis not present

## 2022-01-14 DIAGNOSIS — H43813 Vitreous degeneration, bilateral: Secondary | ICD-10-CM | POA: Diagnosis not present

## 2022-02-28 DIAGNOSIS — H34811 Central retinal vein occlusion, right eye, with macular edema: Secondary | ICD-10-CM | POA: Diagnosis not present

## 2022-02-28 DIAGNOSIS — H43819 Vitreous degeneration, unspecified eye: Secondary | ICD-10-CM | POA: Diagnosis not present

## 2022-02-28 DIAGNOSIS — H35379 Puckering of macula, unspecified eye: Secondary | ICD-10-CM | POA: Diagnosis not present

## 2022-04-09 ENCOUNTER — Telehealth: Payer: Self-pay

## 2022-04-09 NOTE — Telephone Encounter (Signed)
Left message for patient to call back to schedule Medicare Annual Wellness Visit   Last AWV  09/26/13  Please schedule at anytime with [facility] if patient calls the office back.    30 Minutes appointment   Any questions, please call me at [phone]

## 2022-05-14 DIAGNOSIS — H43813 Vitreous degeneration, bilateral: Secondary | ICD-10-CM | POA: Diagnosis not present

## 2022-05-14 DIAGNOSIS — H35371 Puckering of macula, right eye: Secondary | ICD-10-CM | POA: Diagnosis not present

## 2022-05-14 DIAGNOSIS — H34811 Central retinal vein occlusion, right eye, with macular edema: Secondary | ICD-10-CM | POA: Diagnosis not present

## 2022-06-11 DIAGNOSIS — H401110 Primary open-angle glaucoma, right eye, stage unspecified: Secondary | ICD-10-CM | POA: Diagnosis not present

## 2022-06-11 DIAGNOSIS — H43813 Vitreous degeneration, bilateral: Secondary | ICD-10-CM | POA: Diagnosis not present

## 2022-06-11 DIAGNOSIS — H34811 Central retinal vein occlusion, right eye, with macular edema: Secondary | ICD-10-CM | POA: Diagnosis not present

## 2022-06-11 DIAGNOSIS — H35371 Puckering of macula, right eye: Secondary | ICD-10-CM | POA: Diagnosis not present

## 2022-07-09 ENCOUNTER — Ambulatory Visit: Payer: Medicare PPO | Admitting: Internal Medicine

## 2022-07-09 DIAGNOSIS — H34811 Central retinal vein occlusion, right eye, with macular edema: Secondary | ICD-10-CM | POA: Diagnosis not present

## 2022-07-09 DIAGNOSIS — H43813 Vitreous degeneration, bilateral: Secondary | ICD-10-CM | POA: Diagnosis not present

## 2022-07-09 DIAGNOSIS — H35371 Puckering of macula, right eye: Secondary | ICD-10-CM | POA: Diagnosis not present

## 2022-07-09 DIAGNOSIS — H401110 Primary open-angle glaucoma, right eye, stage unspecified: Secondary | ICD-10-CM | POA: Diagnosis not present

## 2022-08-06 DIAGNOSIS — H401110 Primary open-angle glaucoma, right eye, stage unspecified: Secondary | ICD-10-CM | POA: Diagnosis not present

## 2022-08-06 DIAGNOSIS — H35371 Puckering of macula, right eye: Secondary | ICD-10-CM | POA: Diagnosis not present

## 2022-08-06 DIAGNOSIS — H43813 Vitreous degeneration, bilateral: Secondary | ICD-10-CM | POA: Diagnosis not present

## 2022-08-06 DIAGNOSIS — H34811 Central retinal vein occlusion, right eye, with macular edema: Secondary | ICD-10-CM | POA: Diagnosis not present

## 2022-09-10 DIAGNOSIS — H43813 Vitreous degeneration, bilateral: Secondary | ICD-10-CM | POA: Diagnosis not present

## 2022-09-10 DIAGNOSIS — H35371 Puckering of macula, right eye: Secondary | ICD-10-CM | POA: Diagnosis not present

## 2022-09-10 DIAGNOSIS — H34811 Central retinal vein occlusion, right eye, with macular edema: Secondary | ICD-10-CM | POA: Diagnosis not present

## 2022-09-18 ENCOUNTER — Other Ambulatory Visit: Payer: Self-pay | Admitting: Internal Medicine

## 2022-09-18 ENCOUNTER — Encounter: Payer: Self-pay | Admitting: Internal Medicine

## 2022-09-18 ENCOUNTER — Ambulatory Visit (INDEPENDENT_AMBULATORY_CARE_PROVIDER_SITE_OTHER): Payer: Medicare PPO | Admitting: Internal Medicine

## 2022-09-18 ENCOUNTER — Ambulatory Visit: Payer: Medicare PPO

## 2022-09-18 VITALS — BP 122/70 | HR 80 | Temp 98.1°F | Ht 65.0 in | Wt 120.0 lb

## 2022-09-18 DIAGNOSIS — M25561 Pain in right knee: Secondary | ICD-10-CM

## 2022-09-18 DIAGNOSIS — M1711 Unilateral primary osteoarthritis, right knee: Secondary | ICD-10-CM | POA: Diagnosis not present

## 2022-09-18 DIAGNOSIS — Z0001 Encounter for general adult medical examination with abnormal findings: Secondary | ICD-10-CM | POA: Diagnosis not present

## 2022-09-18 DIAGNOSIS — R739 Hyperglycemia, unspecified: Secondary | ICD-10-CM

## 2022-09-18 DIAGNOSIS — M545 Low back pain, unspecified: Secondary | ICD-10-CM | POA: Diagnosis not present

## 2022-09-18 DIAGNOSIS — R350 Frequency of micturition: Secondary | ICD-10-CM

## 2022-09-18 DIAGNOSIS — E7800 Pure hypercholesterolemia, unspecified: Secondary | ICD-10-CM | POA: Diagnosis not present

## 2022-09-18 DIAGNOSIS — R269 Unspecified abnormalities of gait and mobility: Secondary | ICD-10-CM

## 2022-09-18 DIAGNOSIS — M25562 Pain in left knee: Secondary | ICD-10-CM

## 2022-09-18 DIAGNOSIS — G8929 Other chronic pain: Secondary | ICD-10-CM

## 2022-09-18 DIAGNOSIS — M47816 Spondylosis without myelopathy or radiculopathy, lumbar region: Secondary | ICD-10-CM | POA: Diagnosis not present

## 2022-09-18 DIAGNOSIS — R413 Other amnesia: Secondary | ICD-10-CM

## 2022-09-18 DIAGNOSIS — E538 Deficiency of other specified B group vitamins: Secondary | ICD-10-CM | POA: Diagnosis not present

## 2022-09-18 DIAGNOSIS — M5136 Other intervertebral disc degeneration, lumbar region: Secondary | ICD-10-CM | POA: Diagnosis not present

## 2022-09-18 DIAGNOSIS — M4316 Spondylolisthesis, lumbar region: Secondary | ICD-10-CM | POA: Diagnosis not present

## 2022-09-18 DIAGNOSIS — M7989 Other specified soft tissue disorders: Secondary | ICD-10-CM | POA: Diagnosis not present

## 2022-09-18 DIAGNOSIS — E559 Vitamin D deficiency, unspecified: Secondary | ICD-10-CM | POA: Diagnosis not present

## 2022-09-18 DIAGNOSIS — M1712 Unilateral primary osteoarthritis, left knee: Secondary | ICD-10-CM | POA: Diagnosis not present

## 2022-09-18 LAB — URINALYSIS, ROUTINE W REFLEX MICROSCOPIC
Bilirubin Urine: NEGATIVE
Ketones, ur: NEGATIVE
Nitrite: NEGATIVE
Specific Gravity, Urine: 1.01 (ref 1.000–1.030)
Total Protein, Urine: NEGATIVE
Urine Glucose: NEGATIVE
Urobilinogen, UA: 0.2 (ref 0.0–1.0)
pH: 6.5 (ref 5.0–8.0)

## 2022-09-18 LAB — CBC WITH DIFFERENTIAL/PLATELET
Basophils Absolute: 0 10*3/uL (ref 0.0–0.1)
Basophils Relative: 0.7 % (ref 0.0–3.0)
Eosinophils Absolute: 0.4 10*3/uL (ref 0.0–0.7)
Eosinophils Relative: 5.1 % — ABNORMAL HIGH (ref 0.0–5.0)
HCT: 36.6 % (ref 36.0–46.0)
Hemoglobin: 11.9 g/dL — ABNORMAL LOW (ref 12.0–15.0)
Lymphocytes Relative: 28.7 % (ref 12.0–46.0)
Lymphs Abs: 2.1 10*3/uL (ref 0.7–4.0)
MCHC: 32.5 g/dL (ref 30.0–36.0)
MCV: 90.7 fl (ref 78.0–100.0)
Monocytes Absolute: 0.5 10*3/uL (ref 0.1–1.0)
Monocytes Relative: 7.2 % (ref 3.0–12.0)
Neutro Abs: 4.2 10*3/uL (ref 1.4–7.7)
Neutrophils Relative %: 58.3 % (ref 43.0–77.0)
Platelets: 212 10*3/uL (ref 150.0–400.0)
RBC: 4.04 Mil/uL (ref 3.87–5.11)
RDW: 14.1 % (ref 11.5–15.5)
WBC: 7.2 10*3/uL (ref 4.0–10.5)

## 2022-09-18 LAB — BASIC METABOLIC PANEL
BUN: 13 mg/dL (ref 6–23)
CO2: 25 mEq/L (ref 19–32)
Calcium: 9.6 mg/dL (ref 8.4–10.5)
Chloride: 105 mEq/L (ref 96–112)
Creatinine, Ser: 0.8 mg/dL (ref 0.40–1.20)
GFR: 67.04 mL/min (ref >60.00)
Glucose, Bld: 92 mg/dL (ref 70–99)
Potassium: 3.6 mEq/L (ref 3.5–5.1)
Sodium: 138 mEq/L (ref 135–145)

## 2022-09-18 LAB — HEPATIC FUNCTION PANEL
ALT: 11 U/L (ref 0–35)
AST: 21 U/L (ref 0–37)
Albumin: 4.3 g/dL (ref 3.5–5.2)
Alkaline Phosphatase: 66 U/L (ref 39–117)
Bilirubin, Direct: 0.1 mg/dL (ref 0.0–0.3)
Total Bilirubin: 0.5 mg/dL (ref 0.2–1.2)
Total Protein: 7.1 g/dL (ref 6.0–8.3)

## 2022-09-18 LAB — LIPID PANEL
Cholesterol: 225 mg/dL — ABNORMAL HIGH (ref 0–200)
HDL: 71.2 mg/dL (ref >39.00)
LDL Cholesterol: 132 mg/dL — ABNORMAL HIGH (ref 0–99)
NonHDL: 153.48
Total CHOL/HDL Ratio: 3
Triglycerides: 105 mg/dL (ref 0.0–149.0)
VLDL: 21 mg/dL (ref 0.0–40.0)

## 2022-09-18 LAB — HEMOGLOBIN A1C: Hgb A1c MFr Bld: 5.7 % (ref 4.6–6.5)

## 2022-09-18 LAB — VITAMIN B12: Vitamin B-12: 140 pg/mL — ABNORMAL LOW (ref 211–911)

## 2022-09-18 LAB — TSH: TSH: 1.25 u[IU]/mL (ref 0.35–5.50)

## 2022-09-18 LAB — VITAMIN D 25 HYDROXY (VIT D DEFICIENCY, FRACTURES): VITD: 13.5 ng/mL — ABNORMAL LOW (ref 30.00–100.00)

## 2022-09-18 MED ORDER — SULFAMETHOXAZOLE-TRIMETHOPRIM 800-160 MG PO TABS: 1.0000 | ORAL_TABLET | Freq: Two times a day (BID) | ORAL | 0 refills | Status: DC

## 2022-09-18 NOTE — Patient Instructions (Addendum)
Please have your Shingrix (shingles) shots done at your local pharmacy.  Please continue all other medications as before, and refills have been done if requested.  Please have the pharmacy call with any other refills you may need.  Please continue your efforts at being more active, low cholesterol diet, and weight control.  You are otherwise up to date with prevention measures today.  Please keep your appointments with your specialists as you may have planned  You will be contacted regarding the referral for :  Sports Medicine, and Home Health with PT  Please go to the XRAY Department in the first floor for the x-ray testing  Please go to the LAB at the blood drawing area for the tests to be done  You will be contacted by phone if any changes need to be made immediately.  Otherwise, you will receive a letter about your results with an explanation, but please check with MyChart first.  Please remember to sign up for MyChart if you have not done so, as this will be important to you in the future with finding out test results, communicating by private email, and scheduling acute appointments online when needed.  Please make an Appointment to return in 6 months, or sooner if needed

## 2022-09-18 NOTE — Progress Notes (Unsigned)
Patient ID: Hannah Madden, female   DOB: 15-Oct-1934, 88 y.o.   MRN: [id number]         Chief Complaint:: wellness exam and Medical Management of Chronic Issues (Follow up , lower back right pain for awhile )  , gait disorder, right knee pain, low b12, hld, hyperglycemia, memory loss, low vit d       HPI:  Hannah Madden is a 88 y.o. female here for wellness exam; decliens covid booster and dxa for now, for shingrix at pharmacy, o/w up to date                        Also Pt denies chest pain, increased sob or doe, wheezing, orthopnea, PND, increased LE swelling, palpitations, dizziness or syncope.   Pt denies polydipsia, polyuria, or new focal neuro s/s.    Pt denies fever, wt loss, night sweats, loss of appetite, or other constitutional symptoms  Pt continues to have recurring left LBP without change in severity, bowel or bladder change, fever, wt loss,  worsening LE pain/numbness/weakness, or falls but has worsening gait difficulty.  Also has 2 wks worsening right knee arthritic pain and intermittent swelling.  .  Wt Readings from Last 3 Encounters:  09/18/22 120 lb (54.4 kg)  01/08/22 125 lb (56.7 kg)  07/19/21 122 lb (55.3 kg)   BP Readings from Last 3 Encounters:  09/18/22 122/70  01/08/22 128/72  07/19/21 122/72   Immunization History  Administered Date(s) Administered   Fluad Quad(high Dose 65+) 12/23/2019, 12/24/2020   Influenza, High Dose Seasonal PF 01/10/2014, 02/14/2016, 01/23/2017, 11/05/2018   Influenza,inj,Quad PF,6+ Mos 12/07/2014   Influenza-Unspecified 12/02/2015, 11/29/2021   Moderna Sars-Covid-2 Vaccination 05/02/2019, 06/01/2019   PFIZER Comirnaty(Gray Top)Covid-19 Tri-Sucrose Vaccine 08/07/2020   PFIZER(Purple Top)SARS-COV-2 Vaccination 02/04/2020   Pfizer Covid-19 Vaccine Bivalent Booster 50yrs & up 12/31/2020   Td 07/14/2008   Tdap 09/08/2017   Health Maintenance Due  Topic Date Due   Medicare Annual Wellness (AWV)  Never done   Zoster Vaccines-  Shingrix (1 of 2) Never done   DEXA SCAN  Never done   COVID-19 Vaccine (6 - 2023-24 season) 11/01/2021      Past Medical History:  Diagnosis Date   Alopecia    stress related and resolved since her Mother moved to SNF   GERD (gastroesophageal reflux disease)    takes Nexium daily   Glaucoma    uses eye drops daily   H/O hiatal hernia    Hypercholesteremia    takes Simvastatin daily   Pneumonia 1960   Past Surgical History:  Procedure Laterality Date   ABDOMINAL HYSTERECTOMY     fibroids.   COLONOSCOPY     KNEE ARTHROSCOPY WITH MEDIAL MENISECTOMY Left 09/12/2013   Procedure: KNEE ARTHROSCOPY WITH DEBRIDEMENT;  Surgeon: Cammy Copa, MD;  Location: [facility] OR;  Service: Orthopedics;  Laterality: Left;    reports that she has never smoked. She has never used smokeless tobacco. She reports that she does not drink alcohol and does not use drugs. family history is not on file. Allergies  Allergen Reactions   Atorvastatin     REACTION: INTOL to Lipitor w/ leg pain   Macrobid [Nitrofurantoin] Other (See Comments)    diarrhea   Penicillins     REACTION: hives   Ciprofloxacin Rash   Current Outpatient Medications on File Prior to Visit  Medication Sig Dispense Refill   ondansetron (ZOFRAN) 4 MG tablet Take 1 tablet (4  mg total) by mouth every 8 (eight) hours as needed for nausea or vomiting. 30 tablet 5   brimonidine-timolol (COMBIGAN) 0.2-0.5 % ophthalmic solution Place 1 drop into both eyes 2 (two) times daily. (Patient not taking: Reported on 09/18/2022)     brinzolamide (AZOPT) 1 % ophthalmic suspension Place 1 drop into both eyes 2 (two) times daily. (Patient not taking: Reported on 09/18/2022)     hydrOXYzine (ATARAX) 10 MG tablet Take 1 tablet (10 mg total) by mouth 3 (three) times daily as needed. (Patient not taking: Reported on 09/18/2022) 90 tablet 2   pantoprazole (PROTONIX) 40 MG tablet Take 1 tablet (40 mg total) by mouth daily. (Patient not taking: Reported on  09/18/2022) 90 tablet 3   predniSONE (STERAPRED UNI-PAK 21 TAB) 10 MG (21) TBPK tablet See admin instructions. follow package directions (Patient not taking: Reported on 09/18/2022)     tobramycin (TOBREX) 0.3 % ophthalmic solution  (Patient not taking: Reported on 09/18/2022)     triamcinolone cream (KENALOG) 0.5 % Apply topically 4 (four) times daily. (Patient not taking: Reported on 09/18/2022) 30 g 1   No current facility-administered medications on file prior to visit.        ROS:  All others reviewed and negative.  Objective        PE:  BP 122/70 (BP Location: Right Arm, Patient Position: Sitting, Cuff Size: Normal)   Pulse 80   Temp 98.1 F (36.7 C) (Oral)   Ht 5\' 5"  (1.651 m)   Wt 120 lb (54.4 kg)   SpO2 99%   BMI 19.97 kg/m                 Constitutional: Pt appears in NAD               HENT: Head: NCAT.                Right Ear: External ear normal.                 Left Ear: External ear normal.                Eyes: . Pupils are equal, round, and reactive to light. Conjunctivae and EOM are normal               Nose: without d/c or deformity               Neck: Neck supple. Gross normal ROM               Cardiovascular: Normal rate and regular rhythm.                 Pulmonary/Chest: Effort normal and breath sounds without rales or wheezing.                Abd:  Soft, NT, ND, + BS, no organomegaly               Neurological: Pt is alert. At baseline orientation, motor grossly intact               Skin: Skin is warm. No rashes, no other new lesions, LE edema - none               Psychiatric: Pt behavior is normal without agitation   Micro: none  Cardiac tracings I have personally interpreted today:  none  Pertinent Radiological findings (summarize): none   Lab Results  Component Value Date   WBC 7.2 09/18/2022   HGB  11.9 (L) 09/18/2022   HCT 36.6 09/18/2022   PLT 212.0 09/18/2022   GLUCOSE 92 09/18/2022   CHOL 225 (H) 09/18/2022   TRIG 105.0 09/18/2022   HDL  71.20 09/18/2022   LDLDIRECT 152.0 10/25/2015   LDLCALC 132 (H) 09/18/2022   ALT 11 09/18/2022   AST 21 09/18/2022   NA 138 09/18/2022   K 3.6 09/18/2022   CL 105 09/18/2022   CREATININE 0.80 09/18/2022   BUN 13 09/18/2022   CO2 25 09/18/2022   TSH 1.25 09/18/2022   HGBA1C 5.7 09/18/2022   Assessment/Plan:  Hannah Madden is a 88 y.o. Black or African American [2] female with  has a past medical history of Alopecia, GERD (gastroesophageal reflux disease), Glaucoma, H/O hiatal hernia, Hypercholesteremia, and Pneumonia (1960).  Encounter for well adult exam with abnormal findings Age and sex appropriate education and counseling updated with regular exercise and diet Referrals for preventative services - declines dxa Immunizations addressed - declines covid booster, for shingrix at pharmacy Smoking counseling  - none needed Evidence for depression or other mood disorder - none significant Most recent labs reviewed. I have personally reviewed and have noted: 1) the patient's medical and social history 2) The patient's current medications and supplements 3) The patient's height, weight, and BMI have been recorded in the chart   B12 deficiency Lab Results  Component Value Date   VITAMINB12 140 (L) 09/18/2022   Low, to start oral replacement - b12 1000 mcg qd   HYPERCHOLESTEROLEMIA Lab Results  Component Value Date   LDLCALC 132 (H) 09/18/2022   Uncontrolled, , pt to continue zetia 10 mg and lower chol diet, declines retry statin for now   Hyperglycemia Lab Results  Component Value Date   HGBA1C 5.7 09/18/2022   Stable, pt to continue current medical treatment  - diet, wt control   Memory changes With mild worsening per family, declien mri or neurology for now  Vitamin D deficiency Last vitamin D Lab Results  Component Value Date   VD25OH 13.50 (L) 09/18/2022   Low, to start oral replacement   Urinary frequency Also for ua with labs  Bilateral knee  pain Right > left, for xray and refer sport medicine  Low back pain C/w lumbar arthritic pain it seems, for ls spine plain film and refer sport med, also for [facility] with PT  Followup: Return in about 6 months (around 03/21/2023).  Oliver Barre, MD 09/20/2022 7:32 PM [facility] - [facility]

## 2022-09-19 ENCOUNTER — Other Ambulatory Visit: Payer: Self-pay | Admitting: Internal Medicine

## 2022-09-19 MED ORDER — EZETIMIBE 10 MG PO TABS: 10.0000 mg | ORAL_TABLET | Freq: Every day | ORAL | 3 refills | Status: DC

## 2022-09-20 ENCOUNTER — Encounter: Payer: Self-pay | Admitting: Internal Medicine

## 2022-09-20 NOTE — Assessment & Plan Note (Signed)
Right > left, for xray and refer sport medicine

## 2022-09-20 NOTE — Assessment & Plan Note (Signed)
Lab Results  Component Value Date   VITAMINB12 140 (L) 09/18/2022   Low, to start oral replacement - b12 1000 mcg qd

## 2022-09-20 NOTE — Assessment & Plan Note (Signed)
With mild worsening per family, declien mri or neurology for now

## 2022-09-20 NOTE — Assessment & Plan Note (Addendum)
C/w lumbar arthritic pain it seems, for ls spine plain film and refer sport med, also for [facility] with PT

## 2022-09-20 NOTE — Assessment & Plan Note (Signed)
Lab Results  Component Value Date   LDLCALC 132 (H) 09/18/2022   Uncontrolled, , pt to continue zetia 10 mg and lower chol diet, declines retry statin for now

## 2022-09-20 NOTE — Assessment & Plan Note (Signed)
Lab Results  Component Value Date   HGBA1C 5.7 09/18/2022   Stable, pt to continue current medical treatment  - diet, wt control

## 2022-09-20 NOTE — Assessment & Plan Note (Signed)
Also for ua with labs

## 2022-09-20 NOTE — Assessment & Plan Note (Signed)
Last vitamin D Lab Results  Component Value Date   VD25OH 13.50 (L) 09/18/2022   Low, to start oral replacement

## 2022-09-20 NOTE — Assessment & Plan Note (Signed)
Age and sex appropriate education and counseling updated with regular exercise and diet Referrals for preventative services - declines dxa Immunizations addressed - declines covid booster, for shingrix at pharmacy Smoking counseling  - none needed Evidence for depression or other mood disorder - none significant Most recent labs reviewed. I have personally reviewed and have noted: 1) the patient's medical and social history 2) The patient's current medications and supplements 3) The patient's height, weight, and BMI have been recorded in the chart

## 2022-09-21 LAB — URINE CULTURE

## 2022-09-25 ENCOUNTER — Encounter: Payer: Self-pay | Admitting: Internal Medicine

## 2022-10-15 DIAGNOSIS — H401110 Primary open-angle glaucoma, right eye, stage unspecified: Secondary | ICD-10-CM | POA: Diagnosis not present

## 2022-10-15 DIAGNOSIS — H43813 Vitreous degeneration, bilateral: Secondary | ICD-10-CM | POA: Diagnosis not present

## 2022-10-15 DIAGNOSIS — H34811 Central retinal vein occlusion, right eye, with macular edema: Secondary | ICD-10-CM | POA: Diagnosis not present

## 2022-10-15 DIAGNOSIS — H35371 Puckering of macula, right eye: Secondary | ICD-10-CM | POA: Diagnosis not present

## 2022-10-25 IMAGING — CT CT CERVICAL SPINE W/O CM
3 of 4 series · 10 of 33 positions shown, 12 images · non-contrast
Comparison: None.

CLINICAL DATA: Fall

EXAM:
CT CERVICAL SPINE WITHOUT CONTRAST
TECHNIQUE: Multidetector CT imaging of the cervical spine was performed without
intravenous contrast. Multiplanar CT image reconstructions were also
generated.

[Series 5: cor bone · coronal · 0.21mm/px · 3 of 55 slices shown]
[im 11/55  bone]
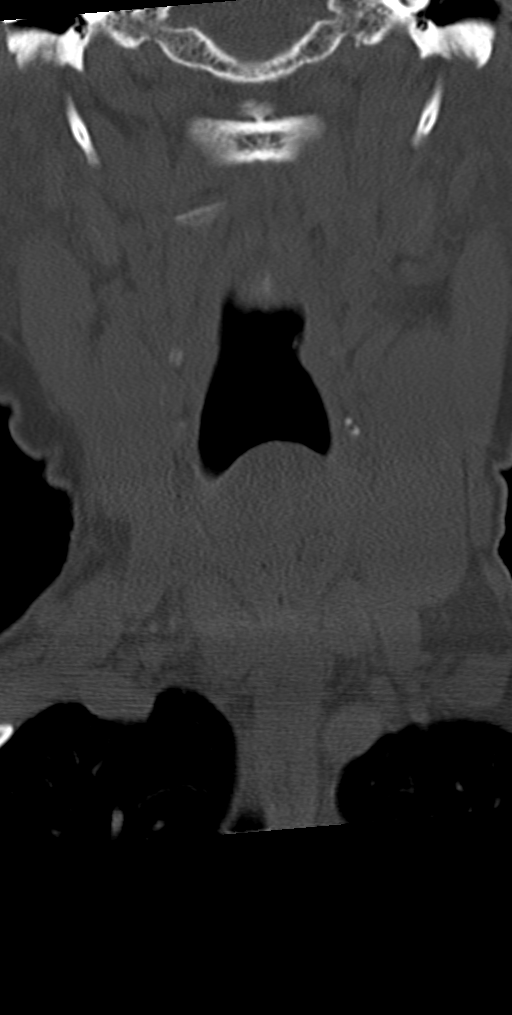
[im 22/55  bone]
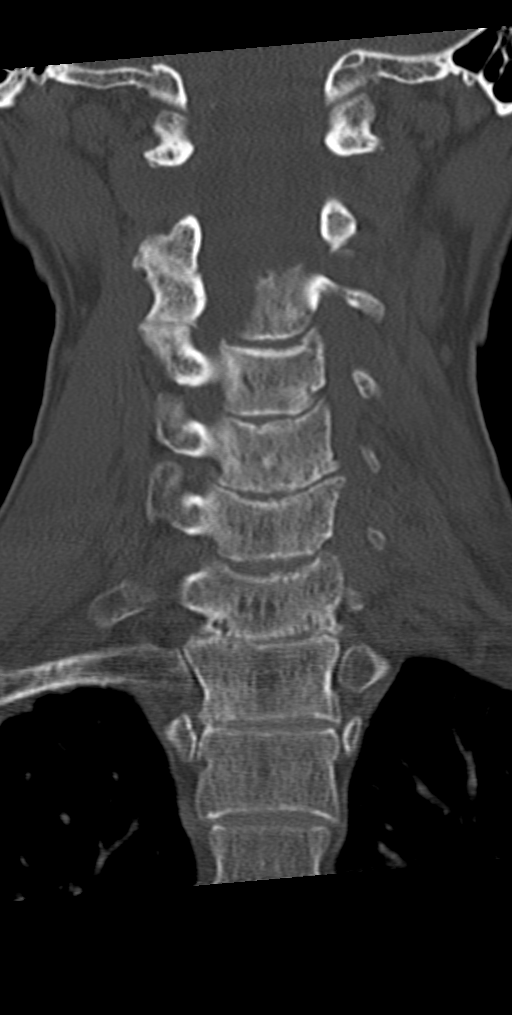
[im 33/55  bone]
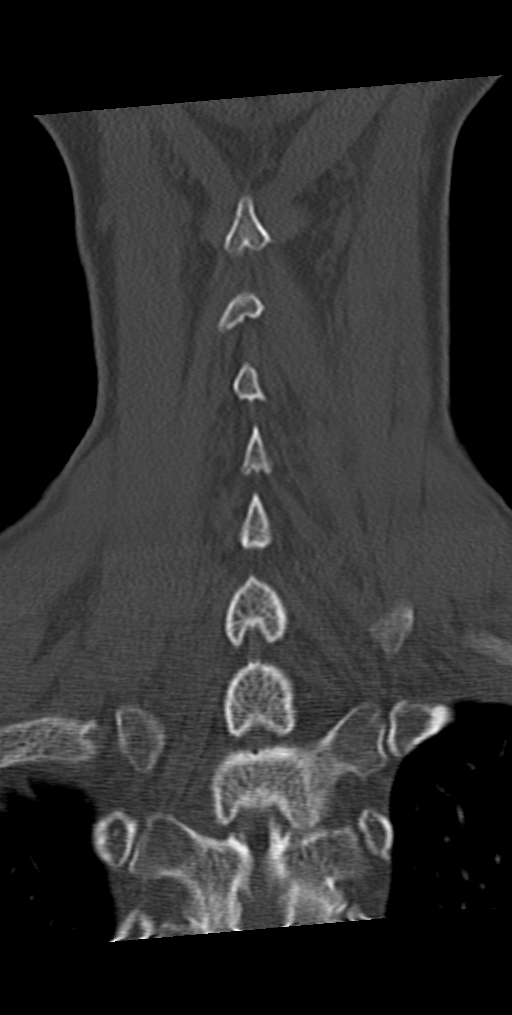

[Series 6: sag bone · sagittal · 0.21mm/px · 5 of 55 slices shown, 6 images]
[im 19/55  bone]
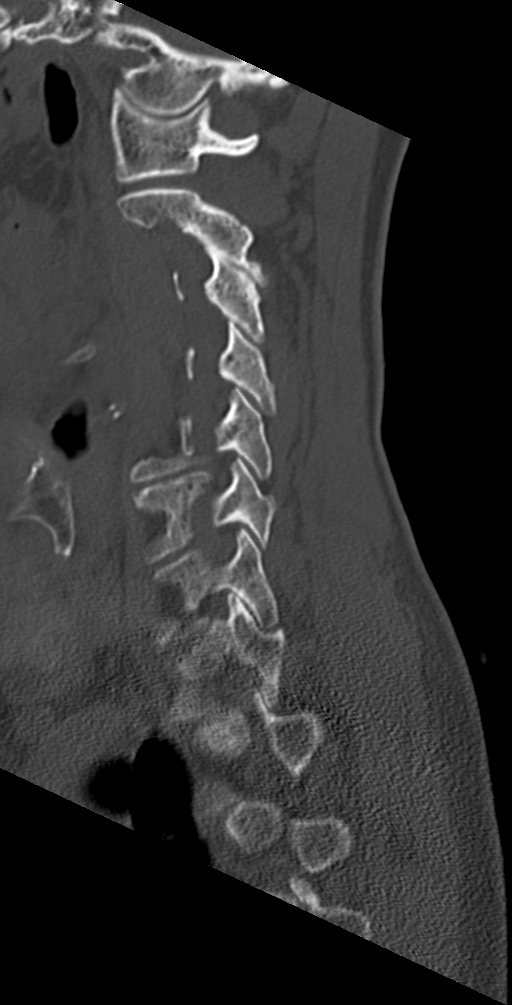
[im 23/55  bone]
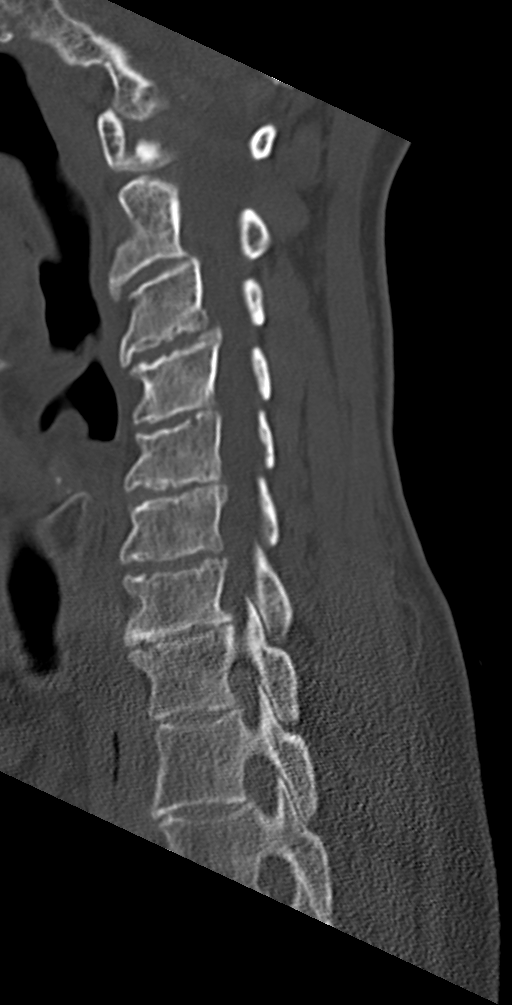
[im 28/55  soft-tissue]
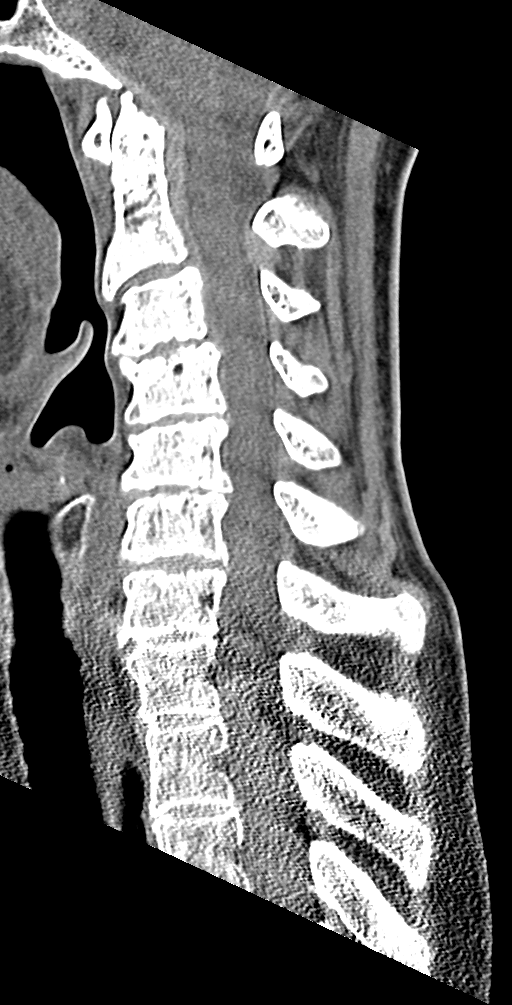
[im 28/55  bone]
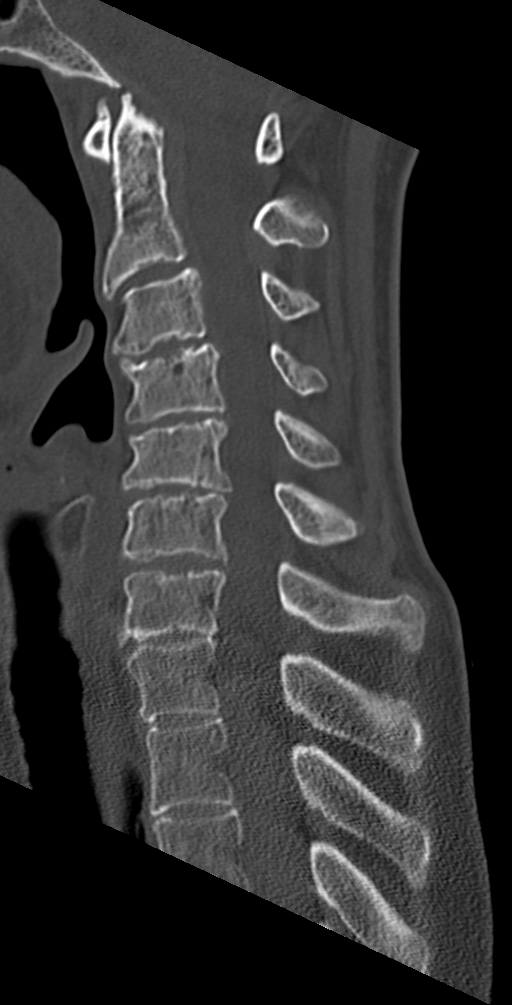
[im 32/55  bone]
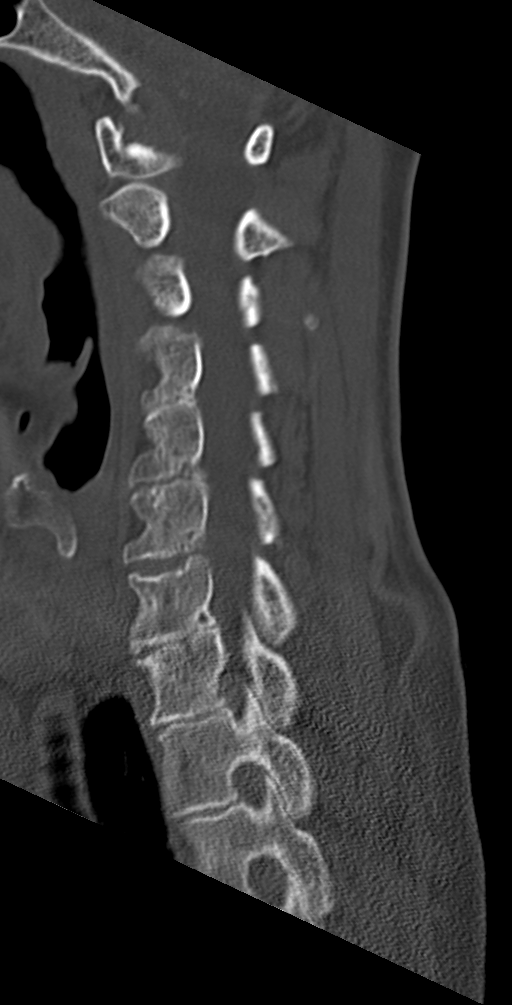
[im 37/55  bone]
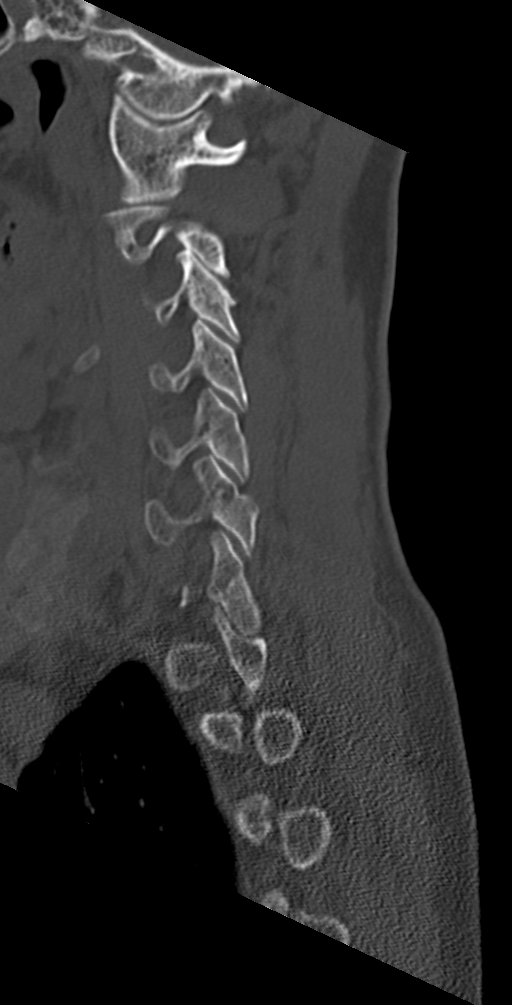

[Series 7: orthogonal axials · axial · 0.21mm/px · z∈[-289,-227]mm · 2 of 92 slices shown, 3 images]
[im 27/92  soft-tissue]
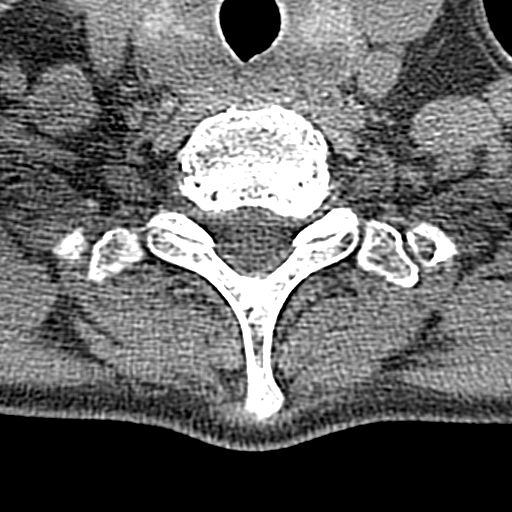
[im 27/92  bone]
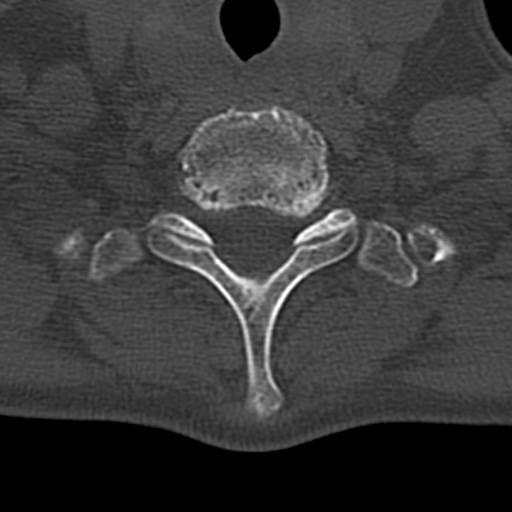
[im 66/92  bone]
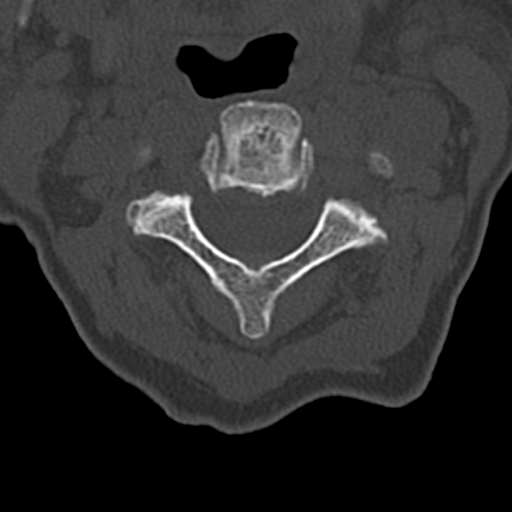

[10 of 33 positions shown; findings below may reference images not displayed]

FINDINGS: Alignment: Normal

Skull base and vertebrae: No acute fracture. No primary bone lesion
or focal pathologic process.

Soft tissues and spinal canal: No prevertebral fluid or swelling. No
visible canal hematoma.

Disc levels:  Diffuse degenerative disc disease and facet disease.

Upper chest: No acute findings

Other: None
IMPRESSION: Degenerative disc and facet disease.  No acute bony abnormality.

## 2022-11-26 DIAGNOSIS — H43813 Vitreous degeneration, bilateral: Secondary | ICD-10-CM | POA: Diagnosis not present

## 2022-11-26 DIAGNOSIS — H401110 Primary open-angle glaucoma, right eye, stage unspecified: Secondary | ICD-10-CM | POA: Diagnosis not present

## 2022-11-26 DIAGNOSIS — H34811 Central retinal vein occlusion, right eye, with macular edema: Secondary | ICD-10-CM | POA: Diagnosis not present

## 2022-11-26 DIAGNOSIS — H35371 Puckering of macula, right eye: Secondary | ICD-10-CM | POA: Diagnosis not present

## 2022-12-11 DIAGNOSIS — M545 Low back pain, unspecified: Secondary | ICD-10-CM | POA: Diagnosis not present

## 2022-12-11 DIAGNOSIS — R262 Difficulty in walking, not elsewhere classified: Secondary | ICD-10-CM | POA: Diagnosis not present

## 2022-12-16 DIAGNOSIS — R262 Difficulty in walking, not elsewhere classified: Secondary | ICD-10-CM | POA: Diagnosis not present

## 2022-12-16 DIAGNOSIS — M545 Low back pain, unspecified: Secondary | ICD-10-CM | POA: Diagnosis not present

## 2022-12-18 DIAGNOSIS — M545 Low back pain, unspecified: Secondary | ICD-10-CM | POA: Diagnosis not present

## 2022-12-18 DIAGNOSIS — R262 Difficulty in walking, not elsewhere classified: Secondary | ICD-10-CM | POA: Diagnosis not present

## 2022-12-23 DIAGNOSIS — R262 Difficulty in walking, not elsewhere classified: Secondary | ICD-10-CM | POA: Diagnosis not present

## 2022-12-23 DIAGNOSIS — M545 Low back pain, unspecified: Secondary | ICD-10-CM | POA: Diagnosis not present

## 2022-12-25 DIAGNOSIS — M545 Low back pain, unspecified: Secondary | ICD-10-CM | POA: Diagnosis not present

## 2022-12-25 DIAGNOSIS — R262 Difficulty in walking, not elsewhere classified: Secondary | ICD-10-CM | POA: Diagnosis not present

## 2022-12-30 DIAGNOSIS — M545 Low back pain, unspecified: Secondary | ICD-10-CM | POA: Diagnosis not present

## 2022-12-30 DIAGNOSIS — R262 Difficulty in walking, not elsewhere classified: Secondary | ICD-10-CM | POA: Diagnosis not present

## 2022-12-31 DIAGNOSIS — H43813 Vitreous degeneration, bilateral: Secondary | ICD-10-CM | POA: Diagnosis not present

## 2022-12-31 DIAGNOSIS — H34811 Central retinal vein occlusion, right eye, with macular edema: Secondary | ICD-10-CM | POA: Diagnosis not present

## 2022-12-31 DIAGNOSIS — H35371 Puckering of macula, right eye: Secondary | ICD-10-CM | POA: Diagnosis not present

## 2023-01-01 DIAGNOSIS — M545 Low back pain, unspecified: Secondary | ICD-10-CM | POA: Diagnosis not present

## 2023-01-01 DIAGNOSIS — R262 Difficulty in walking, not elsewhere classified: Secondary | ICD-10-CM | POA: Diagnosis not present

## 2023-01-06 DIAGNOSIS — R262 Difficulty in walking, not elsewhere classified: Secondary | ICD-10-CM | POA: Diagnosis not present

## 2023-01-06 DIAGNOSIS — M545 Low back pain, unspecified: Secondary | ICD-10-CM | POA: Diagnosis not present

## 2023-01-07 DIAGNOSIS — R262 Difficulty in walking, not elsewhere classified: Secondary | ICD-10-CM | POA: Diagnosis not present

## 2023-01-07 DIAGNOSIS — M545 Low back pain, unspecified: Secondary | ICD-10-CM | POA: Diagnosis not present

## 2023-01-14 ENCOUNTER — Ambulatory Visit: Payer: Medicare PPO | Admitting: Internal Medicine

## 2023-01-15 DIAGNOSIS — M545 Low back pain, unspecified: Secondary | ICD-10-CM | POA: Diagnosis not present

## 2023-01-15 DIAGNOSIS — R262 Difficulty in walking, not elsewhere classified: Secondary | ICD-10-CM | POA: Diagnosis not present

## 2023-01-16 DIAGNOSIS — R262 Difficulty in walking, not elsewhere classified: Secondary | ICD-10-CM | POA: Diagnosis not present

## 2023-01-16 DIAGNOSIS — M545 Low back pain, unspecified: Secondary | ICD-10-CM | POA: Diagnosis not present

## 2023-01-20 DIAGNOSIS — R262 Difficulty in walking, not elsewhere classified: Secondary | ICD-10-CM | POA: Diagnosis not present

## 2023-01-20 DIAGNOSIS — M545 Low back pain, unspecified: Secondary | ICD-10-CM | POA: Diagnosis not present

## 2023-01-22 DIAGNOSIS — M545 Low back pain, unspecified: Secondary | ICD-10-CM | POA: Diagnosis not present

## 2023-01-22 DIAGNOSIS — R262 Difficulty in walking, not elsewhere classified: Secondary | ICD-10-CM | POA: Diagnosis not present

## 2023-01-27 DIAGNOSIS — R262 Difficulty in walking, not elsewhere classified: Secondary | ICD-10-CM | POA: Diagnosis not present

## 2023-01-27 DIAGNOSIS — M545 Low back pain, unspecified: Secondary | ICD-10-CM | POA: Diagnosis not present

## 2023-02-04 DIAGNOSIS — H43813 Vitreous degeneration, bilateral: Secondary | ICD-10-CM | POA: Diagnosis not present

## 2023-02-04 DIAGNOSIS — H34811 Central retinal vein occlusion, right eye, with macular edema: Secondary | ICD-10-CM | POA: Diagnosis not present

## 2023-02-04 DIAGNOSIS — H35371 Puckering of macula, right eye: Secondary | ICD-10-CM | POA: Diagnosis not present

## 2023-02-04 DIAGNOSIS — H401110 Primary open-angle glaucoma, right eye, stage unspecified: Secondary | ICD-10-CM | POA: Diagnosis not present

## 2023-02-10 DIAGNOSIS — R262 Difficulty in walking, not elsewhere classified: Secondary | ICD-10-CM | POA: Diagnosis not present

## 2023-02-10 DIAGNOSIS — M545 Low back pain, unspecified: Secondary | ICD-10-CM | POA: Diagnosis not present

## 2023-02-12 DIAGNOSIS — R262 Difficulty in walking, not elsewhere classified: Secondary | ICD-10-CM | POA: Diagnosis not present

## 2023-02-12 DIAGNOSIS — M545 Low back pain, unspecified: Secondary | ICD-10-CM | POA: Diagnosis not present

## 2023-03-11 DIAGNOSIS — H35371 Puckering of macula, right eye: Secondary | ICD-10-CM | POA: Diagnosis not present

## 2023-03-11 DIAGNOSIS — H43813 Vitreous degeneration, bilateral: Secondary | ICD-10-CM | POA: Diagnosis not present

## 2023-03-11 DIAGNOSIS — H34811 Central retinal vein occlusion, right eye, with macular edema: Secondary | ICD-10-CM | POA: Diagnosis not present

## 2023-03-11 DIAGNOSIS — H401110 Primary open-angle glaucoma, right eye, stage unspecified: Secondary | ICD-10-CM | POA: Diagnosis not present

## 2023-03-12 ENCOUNTER — Other Ambulatory Visit: Payer: Self-pay

## 2023-03-12 ENCOUNTER — Other Ambulatory Visit: Payer: Self-pay | Admitting: Internal Medicine

## 2023-03-23 ENCOUNTER — Ambulatory Visit: Payer: Medicare PPO | Admitting: Internal Medicine

## 2023-04-14 DIAGNOSIS — L600 Ingrowing nail: Secondary | ICD-10-CM | POA: Diagnosis not present

## 2023-04-14 DIAGNOSIS — M79671 Pain in right foot: Secondary | ICD-10-CM | POA: Diagnosis not present

## 2023-04-15 DIAGNOSIS — H43813 Vitreous degeneration, bilateral: Secondary | ICD-10-CM | POA: Diagnosis not present

## 2023-04-15 DIAGNOSIS — H401110 Primary open-angle glaucoma, right eye, stage unspecified: Secondary | ICD-10-CM | POA: Diagnosis not present

## 2023-04-15 DIAGNOSIS — H35371 Puckering of macula, right eye: Secondary | ICD-10-CM | POA: Diagnosis not present

## 2023-04-15 DIAGNOSIS — H34811 Central retinal vein occlusion, right eye, with macular edema: Secondary | ICD-10-CM | POA: Diagnosis not present

## 2023-04-17 ENCOUNTER — Ambulatory Visit (INDEPENDENT_AMBULATORY_CARE_PROVIDER_SITE_OTHER): Payer: Medicare PPO | Admitting: Internal Medicine

## 2023-04-17 ENCOUNTER — Encounter: Payer: Self-pay | Admitting: Internal Medicine

## 2023-04-17 VITALS — BP 118/80 | HR 75 | Temp 97.9°F | Ht 65.0 in | Wt 116.2 lb

## 2023-04-17 DIAGNOSIS — E559 Vitamin D deficiency, unspecified: Secondary | ICD-10-CM | POA: Diagnosis not present

## 2023-04-17 DIAGNOSIS — E7800 Pure hypercholesterolemia, unspecified: Secondary | ICD-10-CM | POA: Diagnosis not present

## 2023-04-17 DIAGNOSIS — K219 Gastro-esophageal reflux disease without esophagitis: Secondary | ICD-10-CM

## 2023-04-17 DIAGNOSIS — M545 Low back pain, unspecified: Secondary | ICD-10-CM | POA: Diagnosis not present

## 2023-04-17 DIAGNOSIS — G8929 Other chronic pain: Secondary | ICD-10-CM | POA: Diagnosis not present

## 2023-04-17 DIAGNOSIS — R739 Hyperglycemia, unspecified: Secondary | ICD-10-CM

## 2023-04-17 DIAGNOSIS — R197 Diarrhea, unspecified: Secondary | ICD-10-CM | POA: Diagnosis not present

## 2023-04-17 DIAGNOSIS — Z0001 Encounter for general adult medical examination with abnormal findings: Secondary | ICD-10-CM | POA: Diagnosis not present

## 2023-04-17 DIAGNOSIS — E538 Deficiency of other specified B group vitamins: Secondary | ICD-10-CM

## 2023-04-17 LAB — BASIC METABOLIC PANEL
BUN: 17 mg/dL (ref 6–23)
CO2: 26 meq/L (ref 19–32)
Calcium: 9.1 mg/dL (ref 8.4–10.5)
Chloride: 104 meq/L (ref 96–112)
Creatinine, Ser: 0.9 mg/dL (ref 0.40–1.20)
GFR: 57.97 mL/min — ABNORMAL LOW (ref >60.00)
Glucose, Bld: 95 mg/dL (ref 70–99)
Potassium: 4.2 meq/L (ref 3.5–5.1)
Sodium: 139 meq/L (ref 135–145)

## 2023-04-17 LAB — HEPATIC FUNCTION PANEL
ALT: 11 U/L (ref 0–35)
AST: 17 U/L (ref 0–37)
Albumin: 4.2 g/dL (ref 3.5–5.2)
Alkaline Phosphatase: 74 U/L (ref 39–117)
Bilirubin, Direct: 0.1 mg/dL (ref 0.0–0.3)
Total Bilirubin: 0.4 mg/dL (ref 0.2–1.2)
Total Protein: 6.8 g/dL (ref 6.0–8.3)

## 2023-04-17 LAB — URINALYSIS, ROUTINE W REFLEX MICROSCOPIC
Bilirubin Urine: NEGATIVE
Ketones, ur: NEGATIVE
Nitrite: NEGATIVE
Specific Gravity, Urine: 1.015 (ref 1.000–1.030)
Total Protein, Urine: NEGATIVE
Urine Glucose: NEGATIVE
Urobilinogen, UA: 0.2 (ref 0.0–1.0)
pH: 6 (ref 5.0–8.0)

## 2023-04-17 LAB — CBC WITH DIFFERENTIAL/PLATELET
Basophils Absolute: 0.1 10*3/uL (ref 0.0–0.1)
Basophils Relative: 0.9 % (ref 0.0–3.0)
Eosinophils Absolute: 0.8 10*3/uL — ABNORMAL HIGH (ref 0.0–0.7)
Eosinophils Relative: 10 % — ABNORMAL HIGH (ref 0.0–5.0)
HCT: 31.1 % — ABNORMAL LOW (ref 36.0–46.0)
Hemoglobin: 10.3 g/dL — ABNORMAL LOW (ref 12.0–15.0)
Lymphocytes Relative: 27.1 % (ref 12.0–46.0)
Lymphs Abs: 2.1 10*3/uL (ref 0.7–4.0)
MCHC: 33.1 g/dL (ref 30.0–36.0)
MCV: 90.2 fL (ref 78.0–100.0)
Monocytes Absolute: 0.7 10*3/uL (ref 0.1–1.0)
Monocytes Relative: 8.9 % (ref 3.0–12.0)
Neutro Abs: 4.2 10*3/uL (ref 1.4–7.7)
Neutrophils Relative %: 53.1 % (ref 43.0–77.0)
Platelets: 232 10*3/uL (ref 150.0–400.0)
RBC: 3.45 Mil/uL — ABNORMAL LOW (ref 3.87–5.11)
RDW: 14.1 % (ref 11.5–15.5)
WBC: 7.9 10*3/uL (ref 4.0–10.5)

## 2023-04-17 LAB — LIPID PANEL
Cholesterol: 179 mg/dL (ref 0–200)
HDL: 65.8 mg/dL (ref >39.00)
LDL Cholesterol: 90 mg/dL (ref 0–99)
NonHDL: 112.73
Total CHOL/HDL Ratio: 3
Triglycerides: 116 mg/dL (ref 0.0–149.0)
VLDL: 23.2 mg/dL (ref 0.0–40.0)

## 2023-04-17 LAB — MICROALBUMIN / CREATININE URINE RATIO
Creatinine,U: 83.7 mg/dL
Microalb Creat Ratio: 41.8 mg/g — ABNORMAL HIGH (ref 0.0–30.0)
Microalb, Ur: 3.5 mg/dL — ABNORMAL HIGH (ref 0.0–1.9)

## 2023-04-17 LAB — TSH: TSH: 1.58 u[IU]/mL (ref 0.35–5.50)

## 2023-04-17 LAB — VITAMIN D 25 HYDROXY (VIT D DEFICIENCY, FRACTURES): VITD: 13.39 ng/mL — ABNORMAL LOW (ref 30.00–100.00)

## 2023-04-17 LAB — VITAMIN B12: Vitamin B-12: 94 pg/mL — ABNORMAL LOW (ref 211–911)

## 2023-04-17 LAB — HEMOGLOBIN A1C: Hgb A1c MFr Bld: 5.5 % (ref 4.6–6.5)

## 2023-04-17 MED ORDER — PANTOPRAZOLE SODIUM 40 MG PO TBEC: 40.0000 mg | DELAYED_RELEASE_TABLET | Freq: Every day | ORAL | 3 refills | Status: DC

## 2023-04-17 MED ORDER — ONDANSETRON HCL 4 MG PO TABS: 4.0000 mg | ORAL_TABLET | Freq: Three times a day (TID) | ORAL | 3 refills | Status: AC | PRN

## 2023-04-17 MED ORDER — REPATHA SURECLICK 140 MG/ML ~~LOC~~ SOAJ: 140.0000 mg | SUBCUTANEOUS | 3 refills | Status: DC

## 2023-04-17 NOTE — Progress Notes (Signed)
 Patient ID: Hannah Madden, female   DOB: May 31, 1934, 89 y.o.   MRN: [id number]         Chief Complaint:: wellness exam and GI symptoms, gerd, hld, low back pain,        HPI:  Hannah Madden is a 89 y.o. female here for wellness exam; decliens all immunizations, and dxa, o/w up to date                        Also has ongoing GI symptoms mostly diarrhea,abd pain, nausea and reflux and has been offered colonoscopy but declined.   Pt denies fever, wt loss, night sweats, loss of appetite, or other constitutional symptoms  Not taking PPI .  Denies worsening dysphagia, vomtiing, other bowel change or blood.  Willing to start repatha .  Pt continues to have recurring LBP without change in severity, bowel or bladder change, fever, wt loss,  worsening LE pain/numbness/weakness, gait change or falls, family requesting UA.  Denies urinary symptoms such as dysuria, frequency, urgency, flank pain, hematuria or n/v, fever, chills.  Pt denies chest pain, increased sob or doe, wheezing, orthopnea, PND, increased LE swelling, palpitations, dizziness or syncope.     Wt Readings from Last 3 Encounters:  04/17/23 116 lb 3.2 oz (52.7 kg)  09/18/22 120 lb (54.4 kg)  01/08/22 125 lb (56.7 kg)   BP Readings from Last 3 Encounters:  04/17/23 118/80  09/18/22 122/70  01/08/22 128/72   Immunization History  Administered Date(s) Administered   Fluad Quad(high Dose 65+) 12/23/2019, 12/24/2020   Influenza, High Dose Seasonal PF 01/10/2014, 02/14/2016, 01/23/2017, 11/05/2018   Influenza,inj,Quad PF,6+ Mos 12/07/2014   Influenza-Unspecified 12/02/2015, 11/29/2021   Moderna Sars-Covid-2 Vaccination 05/02/2019, 06/01/2019   PFIZER Comirnaty(Gray Top)Covid-19 Tri-Sucrose Vaccine 08/07/2020   PFIZER(Purple Top)SARS-COV-2 Vaccination 02/04/2020   Pfizer Covid-19 Vaccine Bivalent Booster 12yrs & up 12/31/2020   Td 07/14/2008   Tdap 09/08/2017   Health Maintenance Due  Topic Date Due   Medicare Annual Wellness (AWV)   Never done   Zoster Vaccines- Shingrix (1 of 2) Never done   Pneumonia Vaccine 4+ Years old (1 of 1 - PCV) Never done   DEXA SCAN  Never done   INFLUENZA VACCINE  10/02/2022   COVID-19 Vaccine (6 - 2024-25 season) 11/02/2022      Past Medical History:  Diagnosis Date   Alopecia    stress related and resolved since her Mother moved to SNF   GERD (gastroesophageal reflux disease)    takes Nexium daily   Glaucoma    uses eye drops daily   H/O hiatal hernia    Hypercholesteremia    takes Simvastatin daily   Pneumonia 1960   Past Surgical History:  Procedure Laterality Date   ABDOMINAL HYSTERECTOMY     fibroids.   COLONOSCOPY     KNEE ARTHROSCOPY WITH MEDIAL MENISECTOMY Left 09/12/2013   Procedure: KNEE ARTHROSCOPY WITH DEBRIDEMENT;  Surgeon: Cammy Copa, MD;  Location: [facility] OR;  Service: Orthopedics;  Laterality: Left;    reports that she has never smoked. She has never used smokeless tobacco. She reports that she does not drink alcohol and does not use drugs. family history is not on file. Allergies  Allergen Reactions   Atorvastatin     REACTION: INTOL to Lipitor w/ leg pain   Macrobid [Nitrofurantoin] Other (See Comments)    diarrhea   Penicillins     REACTION: hives   Ciprofloxacin Rash   Current  Outpatient Medications on File Prior to Visit  Medication Sig Dispense Refill   brimonidine-timolol (COMBIGAN) 0.2-0.5 % ophthalmic solution Place 1 drop into both eyes 2 (two) times daily. (Patient not taking: Reported on 09/18/2022)     brinzolamide (AZOPT) 1 % ophthalmic suspension Place 1 drop into both eyes 2 (two) times daily. (Patient not taking: Reported on 09/18/2022)     ezetimibe (ZETIA) 10 MG tablet Take 1 tablet (10 mg total) by mouth daily. 90 tablet 3   hydrOXYzine (ATARAX) 10 MG tablet Take 1 tablet (10 mg total) by mouth 3 (three) times daily as needed. (Patient not taking: Reported on 04/17/2023) 90 tablet 2   predniSONE (STERAPRED UNI-PAK 21 TAB) 10 MG  (21) TBPK tablet See admin instructions. follow package directions (Patient not taking: Reported on 04/17/2023)     sulfamethoxazole-trimethoprim (BACTRIM DS) 800-160 MG tablet Take 1 tablet by mouth 2 (two) times daily. 20 tablet 0   tobramycin (TOBREX) 0.3 % ophthalmic solution  (Patient not taking: Reported on 04/17/2023)     triamcinolone cream (KENALOG) 0.5 % Apply topically 4 (four) times daily. (Patient not taking: Reported on 04/17/2023) 30 g 1   No current facility-administered medications on file prior to visit.        ROS:  All others reviewed and negative.  Objective        PE:  BP 118/80 (BP Location: Left Arm, Patient Position: Sitting)   Pulse 75   Temp 97.9 F (36.6 C) (Oral)   Ht 5\' 5"  (1.651 m)   Wt 116 lb 3.2 oz (52.7 kg)   BMI 19.34 kg/m                 Constitutional: Pt appears in NAD               HENT: Head: NCAT.                Right Ear: External ear normal.                 Left Ear: External ear normal.                Eyes: . Pupils are equal, round, and reactive to light. Conjunctivae and EOM are normal               Nose: without d/c or deformity               Neck: Neck supple. Gross normal ROM               Cardiovascular: Normal rate and regular rhythm.                 Pulmonary/Chest: Effort normal and breath sounds without rales or wheezing.                Abd:  Soft, NT, ND, + BS, no organomegaly               Neurological: Pt is alert. At baseline orientation, motor grossly intact               Skin: Skin is warm. No rashes, no other new lesions, LE edema - none               Psychiatric: Pt behavior is normal without agitation   Micro: none  Cardiac tracings I have personally interpreted today:  none  Pertinent Radiological findings (summarize): none   Lab Results  Component Value Date   WBC 7.9  04/17/2023   HGB 10.3 (L) 04/17/2023   HCT 31.1 (L) 04/17/2023   PLT 232.0 04/17/2023   GLUCOSE 95 04/17/2023   CHOL 179 04/17/2023   TRIG  116.0 04/17/2023   HDL 65.80 04/17/2023   LDLDIRECT 152.0 10/25/2015   LDLCALC 90 04/17/2023   ALT 11 04/17/2023   AST 17 04/17/2023   NA 139 04/17/2023   K 4.2 04/17/2023   CL 104 04/17/2023   CREATININE 0.90 04/17/2023   BUN 17 04/17/2023   CO2 26 04/17/2023   TSH 1.58 04/17/2023   HGBA1C 5.5 04/17/2023   MICROALBUR 3.5 (H) 04/17/2023   Assessment/Plan:  Hannah Madden is a 89 y.o. Black or African American [2] female with  has a past medical history of Alopecia, GERD (gastroesophageal reflux disease), Glaucoma, H/O hiatal hernia, Hypercholesteremia, and Pneumonia (1960).  Encounter for well adult exam with abnormal findings Age and sex appropriate education and counseling updated with regular exercise and diet Referrals for preventative services - declines dxa Immunizations addressed - declines all for now Smoking counseling  - none needed Evidence for depression or other mood disorder - none significant Most recent labs reviewed. I have personally reviewed and have noted: 1) the patient's medical and social history 2) The patient's current medications and supplements 3) The patient's height, weight, and BMI have been recorded in the chart   Vitamin D deficiency Last vitamin D Lab Results  Component Value Date   VD25OH 13.39 (L) 04/17/2023   Low, to start oral replacement   Low back pain Chronic stable , for UA as well  Hyperglycemia Lab Results  Component Value Date   HGBA1C 5.5 04/17/2023   Stable, pt to continue current medical treatment  - diet, wt control   HYPERCHOLESTEROLEMIA Lab Results  Component Value Date   LDLCALC 90 04/17/2023   Stable, pt to continue zetia 10 every day, and start repatha 140 q 2 wks   GERD Mild uncontrolled, ok for start protonix 40 qd  Diarrhea Chronic stable, declines GI f/u at this time  B12 deficiency Lab Results  Component Value Date   VITAMINB12 94 (L) 04/17/2023   Low, to start oral replacement - b12  1000 mcg qd  Followup: Return in about 6 months (around 10/15/2023).  Oliver Barre, MD 04/19/2023 3:43 PM [facility] - [facility]

## 2023-04-19 ENCOUNTER — Encounter: Payer: Self-pay | Admitting: Internal Medicine

## 2023-04-19 NOTE — Assessment & Plan Note (Signed)
 Mild uncontrolled, ok for start protonix 40 qd

## 2023-04-19 NOTE — Assessment & Plan Note (Signed)
 Chronic stable, declines GI f/u at this time

## 2023-04-19 NOTE — Assessment & Plan Note (Signed)
 Last vitamin D Lab Results  Component Value Date   VD25OH 13.39 (L) 04/17/2023   Low, to start oral replacement

## 2023-04-19 NOTE — Assessment & Plan Note (Signed)
 Lab Results  Component Value Date   VITAMINB12 94 (L) 04/17/2023   Low, to start oral replacement - b12 1000 mcg qd

## 2023-04-19 NOTE — Assessment & Plan Note (Signed)
 Age and sex appropriate education and counseling updated with regular exercise and diet Referrals for preventative services - declines dxa Immunizations addressed - declines all for now Smoking counseling  - none needed Evidence for depression or other mood disorder - none significant Most recent labs reviewed. I have personally reviewed and have noted: 1) the patient's medical and social history 2) The patient's current medications and supplements 3) The patient's height, weight, and BMI have been recorded in the chart

## 2023-04-19 NOTE — Assessment & Plan Note (Signed)
 Chronic stable , for UA as well

## 2023-04-19 NOTE — Assessment & Plan Note (Signed)
 Lab Results  Component Value Date   HGBA1C 5.5 04/17/2023   Stable, pt to continue current medical treatment  - diet, wt control

## 2023-04-19 NOTE — Assessment & Plan Note (Signed)
 Lab Results  Component Value Date   LDLCALC 90 04/17/2023   Stable, pt to continue zetia 10 every day, and start repatha 140 q 2 wks

## 2023-04-19 NOTE — Patient Instructions (Signed)
 Please take all new medication as prescribed  Please continue all other medications as before, and refills have been done if requested.  Please have the pharmacy call with any other refills you may need..  Please keep your appointments with your specialists as you may have planned  Please go to the LAB at the blood drawing area for the tests to be done - the urine testing today  You will be contacted by phone if any changes need to be made immediately.  Otherwise, you will receive a letter about your results with an explanation, but please check with MyChart first.  Please make an Appointment to return in 6 months, or sooner if needed

## 2023-04-20 ENCOUNTER — Telehealth: Payer: Self-pay | Admitting: Internal Medicine

## 2023-04-20 ENCOUNTER — Telehealth: Payer: Self-pay

## 2023-04-20 NOTE — Telephone Encounter (Signed)
 Copied from CRM [id number]. Topic: Clinical - Prescription Issue >> Apr 20, 2023 10:42 AM Hannah Madden wrote: Reason for CRM: Pharmacy called [place] to let us know that the REPATHA requires insurance prior authorization, they sent over the information on 02/14 and they'll refax it today as well.

## 2023-04-20 NOTE — Telephone Encounter (Signed)
 Message sent to PA team.

## 2023-04-21 ENCOUNTER — Telehealth: Payer: Self-pay

## 2023-04-21 ENCOUNTER — Other Ambulatory Visit (HOSPITAL_COMMUNITY): Payer: Self-pay

## 2023-04-21 NOTE — Telephone Encounter (Signed)
 Pharmacy Patient Advocate Encounter   Received notification from Pt Calls Messages that prior authorization for Repatha Sureclick is required/requested.   Insurance verification completed.   The patient is insured through [employer] .   Per test claim: PA required; PA submitted to above mentioned insurance via CoverMyMeds Key/confirmation #/EOC [id number] Status is pending

## 2023-04-22 NOTE — Telephone Encounter (Signed)
 Marland Kitchen

## 2023-04-30 ENCOUNTER — Telehealth: Payer: Self-pay

## 2023-04-30 ENCOUNTER — Other Ambulatory Visit (HOSPITAL_COMMUNITY): Payer: Self-pay

## 2023-04-30 NOTE — Telephone Encounter (Signed)
 Pharmacy Patient Advocate Encounter   Received notification from Patient Pharmacy that prior authorization for Repatha Sureclick is required/requested.   Insurance verification completed.   The patient is insured through [employer] .   Per test claim: PA required; PA submitted to above mentioned insurance via Phone Key/confirmation #/[id number] Status is pending

## 2023-04-30 NOTE — Telephone Encounter (Signed)
 Pharmacy Patient Advocate Encounter  Received notification from [employer] that Prior Authorization for Repatha  has been APPROVED from 03/31/23 to 03/02/2098   PA #/Case ID/Reference #: [id number]  Placed a call to [facility] to notify of the approval.

## 2023-04-30 NOTE — Telephone Encounter (Signed)
 Pharmacy Patient Advocate Encounter  Received notification from [facility] that Prior Authorization for Repatha Sureclick 140mg /ml has been DENIED.  Patient does not have pharmacy coverage with [facility] only medical.    PA #/Case ID/Reference #: [id number]

## 2023-05-20 DIAGNOSIS — H43813 Vitreous degeneration, bilateral: Secondary | ICD-10-CM | POA: Diagnosis not present

## 2023-05-20 DIAGNOSIS — H401110 Primary open-angle glaucoma, right eye, stage unspecified: Secondary | ICD-10-CM | POA: Diagnosis not present

## 2023-05-20 DIAGNOSIS — H34811 Central retinal vein occlusion, right eye, with macular edema: Secondary | ICD-10-CM | POA: Diagnosis not present

## 2023-05-20 DIAGNOSIS — H35371 Puckering of macula, right eye: Secondary | ICD-10-CM | POA: Diagnosis not present

## 2023-06-08 DIAGNOSIS — E785 Hyperlipidemia, unspecified: Secondary | ICD-10-CM | POA: Diagnosis not present

## 2023-06-08 DIAGNOSIS — M7989 Other specified soft tissue disorders: Secondary | ICD-10-CM | POA: Diagnosis not present

## 2023-06-10 DIAGNOSIS — D649 Anemia, unspecified: Secondary | ICD-10-CM | POA: Diagnosis not present

## 2023-06-10 DIAGNOSIS — R898 Other abnormal findings in specimens from other organs, systems and tissues: Secondary | ICD-10-CM | POA: Diagnosis not present

## 2023-06-10 DIAGNOSIS — I890 Lymphedema, not elsewhere classified: Secondary | ICD-10-CM | POA: Diagnosis not present

## 2023-06-10 DIAGNOSIS — Z8742 Personal history of other diseases of the female genital tract: Secondary | ICD-10-CM | POA: Diagnosis not present

## 2023-06-10 DIAGNOSIS — R030 Elevated blood-pressure reading, without diagnosis of hypertension: Secondary | ICD-10-CM | POA: Diagnosis not present

## 2023-06-19 ENCOUNTER — Encounter (HOSPITAL_BASED_OUTPATIENT_CLINIC_OR_DEPARTMENT_OTHER): Payer: Self-pay

## 2023-06-19 ENCOUNTER — Emergency Department (HOSPITAL_BASED_OUTPATIENT_CLINIC_OR_DEPARTMENT_OTHER)

## 2023-06-19 ENCOUNTER — Inpatient Hospital Stay (HOSPITAL_BASED_OUTPATIENT_CLINIC_OR_DEPARTMENT_OTHER)
Admission: EM | Admit: 2023-06-19 | Discharge: 2023-06-23 | DRG: 436 | Disposition: A | Discharge status: Home / Self Care | Attending: Internal Medicine | Admitting: Internal Medicine

## 2023-06-19 DIAGNOSIS — R5381 Other malaise: Secondary | ICD-10-CM | POA: Diagnosis not present

## 2023-06-19 DIAGNOSIS — R55 Syncope and collapse: Secondary | ICD-10-CM | POA: Diagnosis present

## 2023-06-19 DIAGNOSIS — Z888 Allergy status to other drugs, medicaments and biological substances status: Secondary | ICD-10-CM

## 2023-06-19 DIAGNOSIS — E7800 Pure hypercholesterolemia, unspecified: Secondary | ICD-10-CM | POA: Diagnosis present

## 2023-06-19 DIAGNOSIS — Z79899 Other long term (current) drug therapy: Secondary | ICD-10-CM

## 2023-06-19 DIAGNOSIS — E538 Deficiency of other specified B group vitamins: Secondary | ICD-10-CM | POA: Diagnosis present

## 2023-06-19 DIAGNOSIS — Z880 Allergy status to penicillin: Secondary | ICD-10-CM | POA: Diagnosis not present

## 2023-06-19 DIAGNOSIS — R54 Age-related physical debility: Secondary | ICD-10-CM | POA: Diagnosis present

## 2023-06-19 DIAGNOSIS — D630 Anemia in neoplastic disease: Secondary | ICD-10-CM | POA: Diagnosis present

## 2023-06-19 DIAGNOSIS — K219 Gastro-esophageal reflux disease without esophagitis: Secondary | ICD-10-CM | POA: Diagnosis present

## 2023-06-19 DIAGNOSIS — D649 Anemia, unspecified: Secondary | ICD-10-CM | POA: Diagnosis present

## 2023-06-19 DIAGNOSIS — N2889 Other specified disorders of kidney and ureter: Principal | ICD-10-CM

## 2023-06-19 DIAGNOSIS — R109 Unspecified abdominal pain: Secondary | ICD-10-CM | POA: Diagnosis not present

## 2023-06-19 DIAGNOSIS — N390 Urinary tract infection, site not specified: Secondary | ICD-10-CM | POA: Diagnosis present

## 2023-06-19 DIAGNOSIS — C787 Secondary malignant neoplasm of liver and intrahepatic bile duct: Secondary | ICD-10-CM | POA: Diagnosis present

## 2023-06-19 DIAGNOSIS — N3000 Acute cystitis without hematuria: Secondary | ICD-10-CM

## 2023-06-19 DIAGNOSIS — C7B8 Other secondary neuroendocrine tumors: Secondary | ICD-10-CM | POA: Diagnosis not present

## 2023-06-19 DIAGNOSIS — E559 Vitamin D deficiency, unspecified: Secondary | ICD-10-CM | POA: Diagnosis present

## 2023-06-19 DIAGNOSIS — D49511 Neoplasm of unspecified behavior of right kidney: Secondary | ICD-10-CM | POA: Diagnosis not present

## 2023-06-19 DIAGNOSIS — H401132 Primary open-angle glaucoma, bilateral, moderate stage: Secondary | ICD-10-CM | POA: Diagnosis present

## 2023-06-19 DIAGNOSIS — R160 Hepatomegaly, not elsewhere classified: Secondary | ICD-10-CM

## 2023-06-19 DIAGNOSIS — R748 Abnormal levels of other serum enzymes: Secondary | ICD-10-CM | POA: Diagnosis present

## 2023-06-19 DIAGNOSIS — R739 Hyperglycemia, unspecified: Secondary | ICD-10-CM | POA: Diagnosis present

## 2023-06-19 DIAGNOSIS — Z881 Allergy status to other antibiotic agents status: Secondary | ICD-10-CM | POA: Diagnosis not present

## 2023-06-19 DIAGNOSIS — N3289 Other specified disorders of bladder: Secondary | ICD-10-CM | POA: Diagnosis not present

## 2023-06-19 DIAGNOSIS — R1900 Intra-abdominal and pelvic swelling, mass and lump, unspecified site: Secondary | ICD-10-CM | POA: Diagnosis present

## 2023-06-19 DIAGNOSIS — J90 Pleural effusion, not elsewhere classified: Secondary | ICD-10-CM | POA: Diagnosis not present

## 2023-06-19 DIAGNOSIS — C7A8 Other malignant neuroendocrine tumors: Secondary | ICD-10-CM | POA: Diagnosis not present

## 2023-06-19 DIAGNOSIS — D494 Neoplasm of unspecified behavior of bladder: Secondary | ICD-10-CM | POA: Diagnosis not present

## 2023-06-19 DIAGNOSIS — R1909 Other intra-abdominal and pelvic swelling, mass and lump: Secondary | ICD-10-CM | POA: Diagnosis not present

## 2023-06-19 LAB — COMPREHENSIVE METABOLIC PANEL WITH GFR
ALT: 9 U/L (ref 0–44)
AST: 24 U/L (ref 15–41)
Albumin: 4.2 g/dL (ref 3.5–5.0)
Alkaline Phosphatase: 71 U/L (ref 38–126)
Anion gap: 10 (ref 5–15)
BUN: 18 mg/dL (ref 8–23)
CO2: 22 mmol/L (ref 22–32)
Calcium: 9.2 mg/dL (ref 8.9–10.3)
Chloride: 101 mmol/L (ref 98–111)
Creatinine, Ser: 0.81 mg/dL (ref 0.44–1.00)
GFR, Estimated: >60 mL/min (ref >60)
Glucose, Bld: 109 mg/dL — ABNORMAL HIGH (ref 70–99)
Potassium: 3.9 mmol/L (ref 3.5–5.1)
Sodium: 133 mmol/L — ABNORMAL LOW (ref 135–145)
Total Bilirubin: 0.4 mg/dL (ref 0.0–1.2)
Total Protein: 6.7 g/dL (ref 6.5–8.1)

## 2023-06-19 LAB — URINALYSIS, ROUTINE W REFLEX MICROSCOPIC
Bacteria, UA: NONE SEEN
Bilirubin Urine: NEGATIVE
Glucose, UA: NEGATIVE mg/dL
Ketones, ur: NEGATIVE mg/dL
Nitrite: NEGATIVE
Specific Gravity, Urine: 1.013 (ref 1.005–1.030)
WBC, UA: >50 WBC/hpf (ref 0–5)
pH: 6 (ref 5.0–8.0)

## 2023-06-19 LAB — CBC WITH DIFFERENTIAL/PLATELET
Abs Immature Granulocytes: 0.03 10*3/uL (ref 0.00–0.07)
Basophils Absolute: 0 10*3/uL (ref 0.0–0.1)
Basophils Relative: 0 %
Eosinophils Absolute: 1.5 10*3/uL — ABNORMAL HIGH (ref 0.0–0.5)
Eosinophils Relative: 11 %
HCT: 31.4 % — ABNORMAL LOW (ref 36.0–46.0)
Hemoglobin: 10.3 g/dL — ABNORMAL LOW (ref 12.0–15.0)
Immature Granulocytes: 0 %
Lymphocytes Relative: 19 %
Lymphs Abs: 2.5 10*3/uL (ref 0.7–4.0)
MCH: 29.1 pg (ref 26.0–34.0)
MCHC: 32.8 g/dL (ref 30.0–36.0)
MCV: 88.7 fL (ref 80.0–100.0)
Monocytes Absolute: 1 10*3/uL (ref 0.1–1.0)
Monocytes Relative: 7 %
Neutro Abs: 8.1 10*3/uL — ABNORMAL HIGH (ref 1.7–7.7)
Neutrophils Relative %: 63 %
Platelets: 249 10*3/uL (ref 150–400)
RBC: 3.54 MIL/uL — ABNORMAL LOW (ref 3.87–5.11)
RDW: 13.5 % (ref 11.5–15.5)
WBC: 13 10*3/uL — ABNORMAL HIGH (ref 4.0–10.5)
nRBC: 0 % (ref 0.0–0.2)

## 2023-06-19 LAB — LIPASE, BLOOD: Lipase: 52 U/L — ABNORMAL HIGH (ref 11–51)

## 2023-06-19 MED ORDER — SODIUM CHLORIDE 0.9 % IV BOLUS
1000.0000 mL | Freq: Once | INTRAVENOUS | Status: AC
  Administered 2023-06-20: 1000 mL via INTRAVENOUS

## 2023-06-19 MED ORDER — ONDANSETRON HCL 4 MG/2ML IJ SOLN
4.0000 mg | Freq: Once | INTRAMUSCULAR | Status: AC
  Administered 2023-06-20: 4 mg via INTRAVENOUS
  Filled 2023-06-19: qty 2

## 2023-06-19 MED ORDER — IOHEXOL 300 MG/ML  SOLN
100.0000 mL | Freq: Once | INTRAMUSCULAR | Status: AC | PRN
  Administered 2023-06-20: 100 mL via INTRAVENOUS

## 2023-06-19 MED ORDER — MORPHINE SULFATE (PF) 4 MG/ML IV SOLN
4.0000 mg | Freq: Once | INTRAVENOUS | Status: AC
  Administered 2023-06-20: 4 mg via INTRAVENOUS
  Filled 2023-06-19: qty 1

## 2023-06-19 NOTE — ED Notes (Signed)
 Patient transported to CT

## 2023-06-19 NOTE — ED Provider Notes (Signed)
 [facility] Emergency Department Provider Note MRN:  [id number]  Arrival date & time: 06/20/23     Chief Complaint   Back Pain and Abdominal Pain   History of Present Illness   Hannah Madden is a 89 y.o. year-old female with a history of GERD presenting to the ED with chief complaint of abdominal pain.  Low back pain, abdominal pain, nausea on and off for a month, getting worse over the past few days.  No dysuria or hematuria.  Denies fever, no chest pain or shortness of breath.  No numbness or weakness to the arms or legs, no bowel or bladder dysfunction.  Review of Systems  A thorough review of systems was obtained and all systems are negative except as noted in the HPI and PMH.   Patient's Health History    Past Medical History:  Diagnosis Date   Alopecia    stress related and resolved since her Mother moved to SNF   GERD (gastroesophageal reflux disease)    takes Nexium  daily   Glaucoma    uses eye drops daily   H/O hiatal hernia    Hypercholesteremia    takes Simvastatin  daily   Pneumonia 1960    Past Surgical History:  Procedure Laterality Date   ABDOMINAL HYSTERECTOMY     fibroids.   COLONOSCOPY     KNEE ARTHROSCOPY WITH MEDIAL MENISECTOMY Left 09/12/2013   Procedure: KNEE ARTHROSCOPY WITH DEBRIDEMENT;  Surgeon: Jasmine Mesi, MD;  Location: [facility] OR;  Service: Orthopedics;  Laterality: Left;    Family History  Problem Relation Age of Onset   COPD Neg Hx    Diabetes Neg Hx    Heart disease Neg Hx    Stroke Neg Hx    Colon cancer Neg Hx    Esophageal cancer Neg Hx    Pancreatic cancer Neg Hx    Stomach cancer Neg Hx     Social History   Socioeconomic History   Marital status: Widowed    Spouse name: Not on file   Number of children: 4   Years of education: 16+   Highest education level: Not on file  Occupational History   Occupation: financial services    Comment: Museum/gallery exhibitions officer, now Chief Technology Officer  Tobacco Use   Smoking  status: Never   Smokeless tobacco: Never  Vaping Use   Vaping status: Never Used  Substance and Sexual Activity   Alcohol  use: No   Drug use: No   Sexual activity: Yes    Partners: Female    Birth control/protection: Surgical  Other Topics Concern   Not on file  Social History Narrative   HS[employer], [employer], [employer], [employer] - no degree. Married '56. 2 sons - '64, '71; 2 dtrs - '57, '59; 6 grandchildren. 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 She goes to the gym regularly, bowls, takes classes. ACP - discussed the "gift of Love" of ACP.   Social Drivers of Corporate investment banker Strain: Not on file  Food Insecurity: No Food Insecurity (06/20/2023)   Hunger Vital Sign    Worried About Running Out of Food in the Last Year: Never true    Ran Out of Food in the Last Year: Never true  Transportation Needs: No Transportation Needs (06/20/2023)   PRAPARE - Administrator, Civil Service (Medical): No    Lack of Transportation (Non-Medical): No  Physical Activity: Not on file  Stress: Not on file  Social Connections: Moderately Integrated (06/20/2023)   Social Connection and Isolation Panel [NHANES]    Frequency of Communication with Friends and Family: Three times a week    Frequency of Social Gatherings with Friends and Family: More than three times a week    Attends Religious Services: More than 4 times per year    Active Member of Clubs or Organizations: Yes    Attends Banker Meetings: More than 4 times per year    Marital Status: Widowed  Intimate Partner Violence: Not At Risk (06/20/2023)   Humiliation, Afraid, Rape, and Kick questionnaire    Fear of Current or Ex-Partner: No    Emotionally Abused: No    Physically Abused: No    Sexually Abused: No     Physical Exam   Vitals:   06/20/23 0530 06/20/23 0618  BP: 139/81 133/72  Pulse: 95 98  Resp: 18 18  Temp:   97.7 F (36.5 C)  SpO2: 96% 98%    CONSTITUTIONAL: Well-appearing, NAD NEURO/PSYCH:  Alert and oriented x 3, no focal deficits EYES:  eyes equal and reactive ENT/NECK:  no LAD, no JVD CARDIO: Regular rate, well-perfused, normal S1 and S2 PULM:  CTAB no wheezing or rhonchi GI/GU:  non-distended, non-tender MSK/SPINE:  No gross deformities, no edema SKIN:  no rash, atraumatic   *Additional and/or pertinent findings included in MDM below  Diagnostic and Interventional Summary    EKG Interpretation Date/Time:    Ventricular Rate:    PR Interval:    QRS Duration:    QT Interval:    QTC Calculation:   R Axis:      Text Interpretation:         Labs Reviewed  COMPREHENSIVE METABOLIC PANEL WITH GFR - Abnormal; Notable for the following components:      Result Value   Sodium 133 (*)    Glucose, Bld 109 (*)    All other components within normal limits  CBC WITH DIFFERENTIAL/PLATELET - Abnormal; Notable for the following components:   WBC 13.0 (*)    RBC 3.54 (*)    Hemoglobin 10.3 (*)    HCT 31.4 (*)    Neutro Abs 8.1 (*)    Eosinophils Absolute 1.5 (*)    All other components within normal limits  URINALYSIS, ROUTINE W REFLEX MICROSCOPIC - Abnormal; Notable for the following components:   APPearance HAZY (*)    Hgb urine dipstick TRACE (*)    Protein, ur TRACE (*)    Leukocytes,Ua LARGE (*)    All other components within normal limits  LIPASE, BLOOD - Abnormal; Notable for the following components:   Lipase 52 (*)    All other components within normal limits    CT ABDOMEN PELVIS W CONTRAST  Final Result      Medications  iohexol  (OMNIPAQUE ) 300 MG/ML solution 100 mL (100 mLs Intravenous Contrast Given 06/20/23 0042)  morphine  (PF) 4 MG/ML injection 4 mg (4 mg Intravenous Given 06/20/23 0013)  ondansetron  (ZOFRAN ) injection 4 mg (4 mg Intravenous Given 06/20/23 0012)  sodium chloride  0.9 % bolus 1,000 mL (0 mLs Intravenous Stopped 06/20/23 0152)  cefTRIAXone   (ROCEPHIN ) 2 g in sodium chloride  0.9 % 100 mL IVPB (0 g Intravenous Stopped 06/20/23 0325)     Procedures  /  Critical Care Procedures  ED Course and Medical Decision Making  Initial Impression and Ddx Differential diagnosis includes pyelonephritis, kidney stone, AAA, colitis, neoplastic process  Past medical/surgical history that increases  complexity of ED encounter: None  Interpretation of Diagnostics I personally reviewed the Laboratory Testing and my interpretation is as follows: No significant blood count or electrolyte disturbance.  Mild leukocytosis, urinalysis with some evidence to suggest infection  CT abdomen most notable for renal mass with likely metastatic lesions to the liver  Patient Reassessment and Ultimate Disposition/Management     CT imaging results discussed with patient and patient's daughters, they are made aware of the concerns of possible kidney cancer with spread to the liver.  They took the news fairly well but it was quite shocking at first.  Given the UTI and continued pain and these findings will request hospitalist admission for IV antibiotics, expeditious cancer workup.  Patient management required discussion with the following services or consulting groups:  Hospitalist Service  Complexity of Problems Addressed Acute illness or injury that poses threat of life of bodily function  Additional Data Reviewed and Analyzed Further history obtained from: Further history from spouse/family member  Additional Factors Impacting ED Encounter Risk Consideration of hospitalization  Merrick Abe. Harless Lien, MD [facility] Emergency Medicine [facility] [email or web address]  Final Clinical Impressions(s) / ED Diagnoses     ICD-10-CM   1. Renal mass  N28.89     2. Liver masses  R16.0     3. Acute cystitis without hematuria  N30.00       ED Discharge Orders     None        Discharge Instructions Discussed with and Provided to Patient:    Discharge Instructions   None      Edson Graces, MD 06/20/23 [phone]

## 2023-06-19 NOTE — ED Triage Notes (Signed)
 Pt c/o generalized bodyaches, "especially lower back," some associated nausea. Reports it "somewhat worsening today, but it's been off & on for over a month." Denies known urinary symptoms.

## 2023-06-20 ENCOUNTER — Emergency Department (HOSPITAL_BASED_OUTPATIENT_CLINIC_OR_DEPARTMENT_OTHER)

## 2023-06-20 ENCOUNTER — Inpatient Hospital Stay (HOSPITAL_COMMUNITY)

## 2023-06-20 ENCOUNTER — Encounter (HOSPITAL_BASED_OUTPATIENT_CLINIC_OR_DEPARTMENT_OTHER): Payer: Self-pay | Admitting: Internal Medicine

## 2023-06-20 ENCOUNTER — Other Ambulatory Visit: Payer: Self-pay

## 2023-06-20 DIAGNOSIS — N390 Urinary tract infection, site not specified: Secondary | ICD-10-CM | POA: Diagnosis present

## 2023-06-20 DIAGNOSIS — R1900 Intra-abdominal and pelvic swelling, mass and lump, unspecified site: Secondary | ICD-10-CM | POA: Diagnosis present

## 2023-06-20 DIAGNOSIS — N3289 Other specified disorders of bladder: Secondary | ICD-10-CM | POA: Diagnosis not present

## 2023-06-20 DIAGNOSIS — K219 Gastro-esophageal reflux disease without esophagitis: Secondary | ICD-10-CM | POA: Diagnosis present

## 2023-06-20 DIAGNOSIS — D630 Anemia in neoplastic disease: Secondary | ICD-10-CM | POA: Diagnosis present

## 2023-06-20 DIAGNOSIS — N3000 Acute cystitis without hematuria: Secondary | ICD-10-CM | POA: Diagnosis not present

## 2023-06-20 DIAGNOSIS — Z880 Allergy status to penicillin: Secondary | ICD-10-CM | POA: Diagnosis not present

## 2023-06-20 DIAGNOSIS — E559 Vitamin D deficiency, unspecified: Secondary | ICD-10-CM | POA: Diagnosis present

## 2023-06-20 DIAGNOSIS — R55 Syncope and collapse: Secondary | ICD-10-CM

## 2023-06-20 DIAGNOSIS — Z79899 Other long term (current) drug therapy: Secondary | ICD-10-CM | POA: Diagnosis not present

## 2023-06-20 DIAGNOSIS — Z888 Allergy status to other drugs, medicaments and biological substances status: Secondary | ICD-10-CM | POA: Diagnosis not present

## 2023-06-20 DIAGNOSIS — R1909 Other intra-abdominal and pelvic swelling, mass and lump: Secondary | ICD-10-CM | POA: Diagnosis not present

## 2023-06-20 DIAGNOSIS — R54 Age-related physical debility: Secondary | ICD-10-CM | POA: Diagnosis present

## 2023-06-20 DIAGNOSIS — R739 Hyperglycemia, unspecified: Secondary | ICD-10-CM | POA: Diagnosis present

## 2023-06-20 DIAGNOSIS — C787 Secondary malignant neoplasm of liver and intrahepatic bile duct: Secondary | ICD-10-CM | POA: Diagnosis not present

## 2023-06-20 DIAGNOSIS — E7800 Pure hypercholesterolemia, unspecified: Secondary | ICD-10-CM | POA: Diagnosis present

## 2023-06-20 DIAGNOSIS — E538 Deficiency of other specified B group vitamins: Secondary | ICD-10-CM | POA: Diagnosis present

## 2023-06-20 DIAGNOSIS — R748 Abnormal levels of other serum enzymes: Secondary | ICD-10-CM | POA: Diagnosis present

## 2023-06-20 DIAGNOSIS — R109 Unspecified abdominal pain: Secondary | ICD-10-CM | POA: Diagnosis not present

## 2023-06-20 DIAGNOSIS — Z881 Allergy status to other antibiotic agents status: Secondary | ICD-10-CM | POA: Diagnosis not present

## 2023-06-20 DIAGNOSIS — H401132 Primary open-angle glaucoma, bilateral, moderate stage: Secondary | ICD-10-CM | POA: Diagnosis present

## 2023-06-20 HISTORY — DX: Urinary tract infection, site not specified: N39.0

## 2023-06-20 LAB — COMPREHENSIVE METABOLIC PANEL WITH GFR
ALT: 11 U/L (ref 0–44)
AST: 23 U/L (ref 15–41)
Albumin: 3.7 g/dL (ref 3.5–5.0)
Alkaline Phosphatase: 62 U/L (ref 38–126)
Anion gap: 13 (ref 5–15)
BUN: 17 mg/dL (ref 8–23)
CO2: 20 mmol/L — ABNORMAL LOW (ref 22–32)
Calcium: 9.3 mg/dL (ref 8.9–10.3)
Chloride: 103 mmol/L (ref 98–111)
Creatinine, Ser: 0.81 mg/dL (ref 0.44–1.00)
GFR, Estimated: >60 mL/min (ref >60)
Glucose, Bld: 121 mg/dL — ABNORMAL HIGH (ref 70–99)
Potassium: 4.1 mmol/L (ref 3.5–5.1)
Sodium: 136 mmol/L (ref 135–145)
Total Bilirubin: 0.9 mg/dL (ref 0.0–1.2)
Total Protein: 6.7 g/dL (ref 6.5–8.1)

## 2023-06-20 LAB — CBC
HCT: 40.4 % (ref 36.0–46.0)
Hemoglobin: 12.7 g/dL (ref 12.0–15.0)
MCH: 29.4 pg (ref 26.0–34.0)
MCHC: 31.4 g/dL (ref 30.0–36.0)
MCV: 93.5 fL (ref 80.0–100.0)
Platelets: 240 10*3/uL (ref 150–400)
RBC: 4.32 MIL/uL (ref 3.87–5.11)
RDW: 13.4 % (ref 11.5–15.5)
WBC: 14.2 10*3/uL — ABNORMAL HIGH (ref 4.0–10.5)
nRBC: 0 % (ref 0.0–0.2)

## 2023-06-20 LAB — MAGNESIUM: Magnesium: 1.7 mg/dL (ref 1.7–2.4)

## 2023-06-20 LAB — TROPONIN I (HIGH SENSITIVITY)
Troponin I (High Sensitivity): 18 ng/L — ABNORMAL HIGH (ref <18)
Troponin I (High Sensitivity): 17 ng/L (ref <18)

## 2023-06-20 LAB — PHOSPHORUS: Phosphorus: 4.1 mg/dL (ref 2.5–4.6)

## 2023-06-20 MED ORDER — ONDANSETRON HCL 4 MG/2ML IJ SOLN
4.0000 mg | Freq: Once | INTRAMUSCULAR | Status: AC
  Administered 2023-06-20: 4 mg via INTRAVENOUS
  Filled 2023-06-20: qty 2

## 2023-06-20 MED ORDER — ACETAMINOPHEN 650 MG RE SUPP: 650.0000 mg | Freq: Four times a day (QID) | RECTAL | Status: DC | PRN

## 2023-06-20 MED ORDER — TIMOLOL MALEATE 0.5 % OP SOLN
1.0000 [drp] | Freq: Two times a day (BID) | OPHTHALMIC | Status: DC
  Administered 2023-06-20 – 2023-06-23 (×6): 1 [drp] via OPHTHALMIC
  Filled 2023-06-20 (×2): qty 5

## 2023-06-20 MED ORDER — VITAMIN B-12 1000 MCG PO TABS
1000.0000 ug | ORAL_TABLET | Freq: Every day | ORAL | Status: DC
  Administered 2023-06-20 – 2023-06-23 (×4): 1000 ug via ORAL
  Filled 2023-06-20 (×4): qty 1

## 2023-06-20 MED ORDER — SODIUM CHLORIDE 0.9 % IV SOLN
1.0000 g | INTRAVENOUS | Status: DC
  Administered 2023-06-21 – 2023-06-23 (×3): 1 g via INTRAVENOUS
  Filled 2023-06-20 (×3): qty 10

## 2023-06-20 MED ORDER — MAGNESIUM SULFATE 2 GM/50ML IV SOLN
2.0000 g | Freq: Once | INTRAVENOUS | Status: AC
  Administered 2023-06-20: 2 g via INTRAVENOUS
  Filled 2023-06-20: qty 50

## 2023-06-20 MED ORDER — BRINZOLAMIDE 1 % OP SUSP
1.0000 [drp] | Freq: Two times a day (BID) | OPHTHALMIC | Status: DC
  Administered 2023-06-20 – 2023-06-23 (×6): 1 [drp] via OPHTHALMIC
  Filled 2023-06-20 (×2): qty 10

## 2023-06-20 MED ORDER — PANTOPRAZOLE SODIUM 40 MG PO TBEC
40.0000 mg | DELAYED_RELEASE_TABLET | Freq: Every day | ORAL | Status: DC
  Administered 2023-06-20 – 2023-06-23 (×4): 40 mg via ORAL
  Filled 2023-06-20 (×4): qty 1

## 2023-06-20 MED ORDER — SODIUM CHLORIDE 0.9% FLUSH
3.0000 mL | Freq: Two times a day (BID) | INTRAVENOUS | Status: DC
  Administered 2023-06-20 – 2023-06-23 (×6): 3 mL via INTRAVENOUS

## 2023-06-20 MED ORDER — ACETAMINOPHEN 325 MG PO TABS
650.0000 mg | ORAL_TABLET | Freq: Four times a day (QID) | ORAL | Status: DC | PRN
  Administered 2023-06-20 – 2023-06-22 (×2): 650 mg via ORAL
  Filled 2023-06-20 (×2): qty 2

## 2023-06-20 MED ORDER — ONDANSETRON HCL 4 MG PO TABS: 4.0000 mg | ORAL_TABLET | Freq: Four times a day (QID) | ORAL | Status: DC | PRN

## 2023-06-20 MED ORDER — SODIUM CHLORIDE 0.9 % IV SOLN
2.0000 g | Freq: Once | INTRAVENOUS | Status: AC
  Administered 2023-06-20: 2 g via INTRAVENOUS
  Filled 2023-06-20: qty 20

## 2023-06-20 MED ORDER — MORPHINE SULFATE (PF) 2 MG/ML IV SOLN: 2.0000 mg | INTRAVENOUS | Status: DC | PRN

## 2023-06-20 MED ORDER — METHOCARBAMOL 500 MG PO TABS
500.0000 mg | ORAL_TABLET | Freq: Once | ORAL | Status: AC
  Administered 2023-06-20: 500 mg via ORAL
  Filled 2023-06-20: qty 1

## 2023-06-20 MED ORDER — ONDANSETRON HCL 4 MG/2ML IJ SOLN
4.0000 mg | Freq: Four times a day (QID) | INTRAMUSCULAR | Status: DC | PRN
  Administered 2023-06-22 – 2023-06-23 (×2): 4 mg via INTRAVENOUS
  Filled 2023-06-20 (×2): qty 2

## 2023-06-20 MED ORDER — SODIUM CHLORIDE 0.9 % IV SOLN: INTRAVENOUS | Status: AC

## 2023-06-20 MED ORDER — BRIMONIDINE TARTRATE 0.2 % OP SOLN
1.0000 [drp] | Freq: Two times a day (BID) | OPHTHALMIC | Status: DC
  Administered 2023-06-20 – 2023-06-23 (×6): 1 [drp] via OPHTHALMIC
  Filled 2023-06-20 (×2): qty 5

## 2023-06-20 MED ORDER — BRIMONIDINE TARTRATE-TIMOLOL 0.2-0.5 % OP SOLN: 1.0000 [drp] | Freq: Two times a day (BID) | OPHTHALMIC | Status: DC

## 2023-06-20 MED ORDER — VITAMIN D 25 MCG (1000 UNIT) PO TABS
1000.0000 [IU] | ORAL_TABLET | Freq: Every day | ORAL | Status: DC
  Administered 2023-06-20 – 2023-06-23 (×4): 1000 [IU] via ORAL
  Filled 2023-06-20 (×4): qty 1

## 2023-06-20 NOTE — ED Notes (Signed)
 Attempted to call dtr to update. Went to [facility]. Left message with info regarding her updated bed status.

## 2023-06-20 NOTE — ED Notes (Signed)
 PT's dtr updated on room number and where to go when getting to [facility].

## 2023-06-20 NOTE — ED Notes (Signed)
 [facility] called for transport.

## 2023-06-20 NOTE — Plan of Care (Signed)

## 2023-06-20 NOTE — Progress Notes (Signed)
 Pt [facility]. She is awake and alert and oriented x4; vital signs stable at baseline on RA. Daughter present at bedside. Standby assist to [facility], pt tolerated well. Resting in bed with bed alarm on. Admitting MD notified of pt arrival.

## 2023-06-20 NOTE — ED Notes (Addendum)
 [facility] gave 4mg  zofran  IVP when pt became nauseated upon getting up and moving onto stretcher. Pt voided before getting on stretcher as well. Brief dry.

## 2023-06-20 NOTE — Consult Note (Signed)
 Urology Consult  Referring physician: Dr Lucienne Ryder  Reason for referral: ? Bladder mass/renal mass  Chief Complaint: ? Bladder and renal mass  History of Present Ilness: Patient admitted through the emergency room with back and abdominal pain and fatigue and malaise.  She was found to have a heterogeneous mass in the superior pole of the right kidney 3.5 cm in size concerning for renal cell carcinoma.  She has multiple large hepatic metastases.  She has lymphadenopathy.  White blood count 14.2.  Hemoglobin 12.7.  Patient is voiding well.  No burning.  No blood in urine.  No previous bladder surgery.  I spoke to interventional radiology Dr. Marne Sings.  He felt that the liver findings were likely not from the kidney based upon their density in size and multiplicity.  He said it is more common to have it with metastatic bowel cancer.  Sometimes is seen with metastatic bladder cancer.  He saw a shadow in the left upper aspect of the bladder almost like a circle of contrast.  I reviewed the findings with him.  The bladder was otherwise thin-walled.  Bladder was very full    Past Medical History:  Diagnosis Date   Alopecia    stress related and resolved since her Mother moved to SNF   GERD (gastroesophageal reflux disease)    takes Nexium  daily   Glaucoma    uses eye drops daily   H/O hiatal hernia    Hypercholesteremia    takes Simvastatin  daily   Pneumonia 03/03/1958   Vitamin D  deficiency 12/24/2019   Past Surgical History:  Procedure Laterality Date   ABDOMINAL HYSTERECTOMY     fibroids.   COLONOSCOPY     KNEE ARTHROSCOPY WITH MEDIAL MENISECTOMY Left 09/12/2013   Procedure: KNEE ARTHROSCOPY WITH DEBRIDEMENT;  Surgeon: Jasmine Mesi, MD;  Location: [facility] OR;  Service: Orthopedics;  Laterality: Left;    Medications: I have reviewed the patient's current medications. Allergies:  Allergies  Allergen Reactions   Atorvastatin     REACTION: INTOL to Lipitor w/ leg pain    Macrobid [Nitrofurantoin] Other (See Comments)    diarrhea   Penicillins     REACTION: hives   Ciprofloxacin  Rash    Family History  Problem Relation Age of Onset   COPD Neg Hx    Diabetes Neg Hx    Heart disease Neg Hx    Stroke Neg Hx    Colon cancer Neg Hx    Esophageal cancer Neg Hx    Pancreatic cancer Neg Hx    Stomach cancer Neg Hx    Social History:  reports that she has never smoked. She has never used smokeless tobacco. She reports that she does not drink alcohol  and does not use drugs.  ROS: All systems are reviewed and negative except as noted. Rest negative  Physical Exam:  Vital signs in last 24 hours: Temp:  [97.7 F (36.5 C)-98.4 F (36.9 C)] 97.7 F (36.5 C) (04/19 0618) Pulse Rate:  [72-100] 86 (04/19 1116) Resp:  [14-18] 16 (04/19 1116) BP: (118-206)/(72-97) 131/75 (04/19 1116) SpO2:  [95 %-100 %] 100 % (04/19 1116)  Cardiovascular: Skin warm; not flushed Respiratory: Breaths quiet; no shortness of breath Abdomen: No masses Neurological: Normal sensation to touch Musculoskeletal: Normal motor function arms and legs Lymphatics: No inguinal adenopathy Skin: No rashes Genitourinary: No distress  Laboratory Data:  Results for orders placed or performed during the hospital encounter of 06/19/23 (from the past 72 hours)  Comprehensive metabolic panel  Status: Abnormal   Collection Time: 06/19/23  8:56 PM  Result Value Ref Range   Sodium 133 (L) 135 - 145 mmol/L   Potassium 3.9 3.5 - 5.1 mmol/L   Chloride 101 98 - 111 mmol/L   CO2 22 22 - 32 mmol/L   Glucose, Bld 109 (H) 70 - 99 mg/dL    Comment: Glucose reference range applies only to samples taken after fasting for at least 8 hours.   BUN 18 8 - 23 mg/dL   Creatinine, Ser 0.27 0.44 - 1.00 mg/dL   Calcium  9.2 8.9 - 10.3 mg/dL   Total Protein 6.7 6.5 - 8.1 g/dL   Albumin 4.2 3.5 - 5.0 g/dL   AST 24 15 - 41 U/L   ALT 9 0 - 44 U/L   Alkaline Phosphatase 71 38 - 126 U/L   Total Bilirubin 0.4  0.0 - 1.2 mg/dL   GFR, Estimated >25 >36 mL/min    Comment: (NOTE) Calculated using the CKD-EPI Creatinine Equation (2021)    Anion gap 10 5 - 15    Comment: Performed at [facility], [address], [place], [place] [address]  CBC with Differential     Status: Abnormal   Collection Time: 06/19/23  8:56 PM  Result Value Ref Range   WBC 13.0 (H) 4.0 - 10.5 K/uL   RBC 3.54 (L) 3.87 - 5.11 MIL/uL   Hemoglobin 10.3 (L) 12.0 - 15.0 g/dL   HCT 47.4 (L) 25.9 - 56.3 %   MCV 88.7 80.0 - 100.0 fL   MCH 29.1 26.0 - 34.0 pg   MCHC 32.8 30.0 - 36.0 g/dL   RDW 87.5 64.3 - 32.9 %   Platelets 249 150 - 400 K/uL   nRBC 0.0 0.0 - 0.2 %   Neutrophils Relative % 63 %   Neutro Abs 8.1 (H) 1.7 - 7.7 K/uL   Lymphocytes Relative 19 %   Lymphs Abs 2.5 0.7 - 4.0 K/uL   Monocytes Relative 7 %   Monocytes Absolute 1.0 0.1 - 1.0 K/uL   Eosinophils Relative 11 %   Eosinophils Absolute 1.5 (H) 0.0 - 0.5 K/uL   Basophils Relative 0 %   Basophils Absolute 0.0 0.0 - 0.1 K/uL   Immature Granulocytes 0 %   Abs Immature Granulocytes 0.03 0.00 - 0.07 K/uL    Comment: Performed at [facility], [address], [place], [place] [address]  Urinalysis, Routine w reflex microscopic -Urine, Clean Catch     Status: Abnormal   Collection Time: 06/19/23  8:56 PM  Result Value Ref Range   Color, Urine YELLOW YELLOW   APPearance HAZY (A) CLEAR   Specific Gravity, Urine 1.013 1.005 - 1.030   pH 6.0 5.0 - 8.0   Glucose, UA NEGATIVE NEGATIVE mg/dL   Hgb urine dipstick TRACE (A) NEGATIVE   Bilirubin Urine NEGATIVE NEGATIVE   Ketones, ur NEGATIVE NEGATIVE mg/dL   Protein, ur TRACE (A) NEGATIVE mg/dL   Nitrite NEGATIVE NEGATIVE   Leukocytes,Ua LARGE (A) NEGATIVE   RBC / HPF 6-10 0 - 5 RBC/hpf   WBC, UA >50 0 - 5 WBC/hpf   Bacteria, UA NONE SEEN NONE SEEN   Squamous Epithelial / HPF 0-5 0 - 5 /HPF   WBC Clumps PRESENT    Mucus PRESENT     Comment: Performed at [facility], [address], [place], [place] [address]  Lipase, blood     Status: Abnormal   Collection Time: 06/19/23  8:56  PM  Result Value Ref Range   Lipase 52 (H) 11 - 51 U/L    Comment: Performed at [facility], [address] [place], [place] [address]  Magnesium      Status: None   Collection Time: 06/20/23 11:52 AM  Result Value Ref Range   Magnesium  1.7 1.7 - 2.4 mg/dL    Comment: Performed at [facility], [address], [place], [place] [address]  Phosphorus     Status: None   Collection Time: 06/20/23 11:52 AM  Result Value Ref Range   Phosphorus 4.1 2.5 - 4.6 mg/dL    Comment: Performed at [facility], [address] [place], [place] [address]  Comprehensive metabolic panel     Status: Abnormal   Collection Time: 06/20/23 11:52 AM  Result Value Ref Range   Sodium 136 135 - 145 mmol/L   Potassium 4.1 3.5 - 5.1 mmol/L   Chloride 103 98 - 111 mmol/L   CO2 20 (L) 22 - 32 mmol/L   Glucose, Bld 121 (H) 70 - 99 mg/dL    Comment: Glucose reference range applies only to samples taken after fasting for at least 8 hours.   BUN 17 8 - 23 mg/dL   Creatinine, Ser 3.24 0.44 - 1.00 mg/dL   Calcium  9.3 8.9 - 10.3 mg/dL   Total Protein 6.7 6.5 - 8.1 g/dL   Albumin 3.7 3.5 - 5.0 g/dL   AST 23 15 - 41 U/L   ALT 11 0 - 44 U/L   Alkaline Phosphatase 62 38 - 126 U/L   Total Bilirubin 0.9 0.0 - 1.2 mg/dL   GFR, Estimated >40 >10 mL/min    Comment: (NOTE) Calculated using the CKD-EPI Creatinine Equation (2021)    Anion gap 13 5 - 15    Comment: Performed at [facility], [address] [place], [place] [address]  CBC     Status: Abnormal   Collection Time: 06/20/23 11:52 AM  Result Value Ref Range   WBC 14.2 (H) 4.0 - 10.5 K/uL   RBC 4.32 3.87 - 5.11 MIL/uL   Hemoglobin 12.7 12.0 - 15.0 g/dL   HCT 66.4 40.3 - 47.4 %   MCV 93.5 80.0 - 100.0 fL   MCH 29.4 26.0 - 34.0 pg   MCHC 31.4 30.0 - 36.0  g/dL   RDW 25.9 56.3 - 87.5 %   Platelets 240 150 - 400 K/uL   nRBC 0.0 0.0 - 0.2 %    Comment: Performed at [facility], [address] [place], [place] [address]  Troponin I (High Sensitivity)     Status: Abnormal   Collection Time: 06/20/23 11:52 AM  Result Value Ref Range   Troponin I (High Sensitivity) 18 (H) <18 ng/L    Comment: (NOTE) Elevated high sensitivity troponin I (hsTnI) values and significant  changes across serial measurements may suggest ACS but many other  chronic and acute conditions are known to elevate hsTnI results.  Refer to the "Links" section for chest pain algorithms and additional  guidance. Performed at [facility], [address], [place], [place] [address]    No results found for this or any previous visit (from the past 240 hours). Creatinine: Recent Labs    06/19/23 2056 06/20/23 1152  CREATININE 0.81 0.81    Xrays: See report/chart Reviewed findings noted  Impression/Assessment:  Patient has what appears to be a renal cell carcinoma.  She will have a biopsy  of the liver on Monday according to Dr. Marne Sings and the primary team as it may not be metastatic renal cell carcinoma.  She may have a lesion in the bladder and depending upon the results of the liver biopsy we would recommend a cystoscopy as an outpatient.  The decision for cystoscopy will be done pending the liver biopsy results.  Plan:  We will continue to follow the patient.  I explained things in detail to her family and to the patient today  Geralyn Knee A Torrance Stockley 06/20/2023, 12:57 PM

## 2023-06-20 NOTE — Progress Notes (Signed)
 Pt taken to the bathroom for a wash up. Noted to have a blank stare and not responding. RRT called and pt placed back in bed. Vitals taken. Dr Bonita Bussing called and made aware. Obtained order for EKG. EKG results in chart. Pt to be transferred to telemetry unit 5E. Awaiting for bed to be available. Pt AAOX4 noted in bed, safe with call bell within reach.

## 2023-06-20 NOTE — H&P (Signed)
 History and Physical    Patient: Hannah Madden XBJ:[id number] DOB: 1935-02-08 DOA: 06/19/2023 DOS: the patient was seen and examined on 06/20/2023 PCP: Roslyn Coombe, MD  Patient coming from: Home  Chief Complaint:  Chief Complaint  Patient presents with   Back Pain   Abdominal Pain   HPI: Hannah Madden is a 89 y.o. female with medical history significant of alopecia, GERD, hide hernia, hyperlipidemia, history of pneumonia, glaucoma, pseudophakia of both eyes, vitamin D  deficiency, vitamin B12 deficiency, anxiety, history of syncope who presented to the emergency department with complaints of back and abdominal pain associated with fatigue, malaise body aches nausea, which she has had on and off for the past month, but but this is the worst that her symptoms have been.  Earlier this morning, she had a syncopal episode on the floor while going to the bathroom.  She was assisted by the staff and the rapid response team.  She has been having a lot of urinary frequency, but no flank pain, dysuria or hematuria.  She has also been nauseous, but no emesis, diarrhea, constipation, melena or hematochezia.  She denied fever, chills, rhinorrhea, sore throat, wheezing or hemoptysis.  No chest pain, palpitations, diaphoresis, PND, orthopnea or pitting edema of the lower extremities. No polyuria, polydipsia, polyphagia or blurred vision.   Lab work: Urinalysis hazy, trace hemoglobin, trace protein, large leukocyte esterase, WBC greater than 50, no bacteria seen and WBC clumps.  CBC showed white count 13.0, hemoglobin 10.3 g/dL and platelets 308.  Lipase was 52 units/L.  CMP showed a 733 mmol/L and a glucose of 109 mg/dL, the rest of the CMP was normal.   Imaging: CT abdomen/pelvis with contrast showing a heterogeneously enhancing mass in the superior pole of the right kidney measuring 3.5 cm, concerning for renal cell carcinoma.  Multiple large hepatic metastasis.  Mesentery mass or lymphadenopathy in the  left central mesentery measuring 3.9 cm.  Aortic atherosclerosis.   ED course: Initial vital signs were temperature 98.4 F, pulse 87, respiration 14, BP 185/88 mmHg O2 sat 100% on room air.  The patient received 1000 mL of NS bolus.  Ondansetron  4 mg IVP x 2, morphine  4 mg IVP x 1 and ceftriaxone  2 g IVPB.  Review of Systems: As mentioned in the history of present illness. All other systems reviewed and are negative.  Past Medical History:  Diagnosis Date   Alopecia    stress related and resolved since her Mother moved to SNF   GERD (gastroesophageal reflux disease)    takes Nexium  daily   Glaucoma    uses eye drops daily   H/O hiatal hernia    Hypercholesteremia    takes Simvastatin  daily   Pneumonia 1960   Past Surgical History:  Procedure Laterality Date   ABDOMINAL HYSTERECTOMY     fibroids.   COLONOSCOPY     KNEE ARTHROSCOPY WITH MEDIAL MENISECTOMY Left 09/12/2013   Procedure: KNEE ARTHROSCOPY WITH DEBRIDEMENT;  Surgeon: Jasmine Mesi, MD;  Location: [facility] OR;  Service: Orthopedics;  Laterality: Left;   Social History:  reports that she has never smoked. She has never used smokeless tobacco. She reports that she does not drink alcohol  and does not use drugs.  Allergies  Allergen Reactions   Atorvastatin     REACTION: INTOL to Lipitor w/ leg pain   Macrobid [Nitrofurantoin] Other (See Comments)    diarrhea   Penicillins     REACTION: hives   Ciprofloxacin  Rash  Family History  Problem Relation Age of Onset   COPD Neg Hx    Diabetes Neg Hx    Heart disease Neg Hx    Stroke Neg Hx    Colon cancer Neg Hx    Esophageal cancer Neg Hx    Pancreatic cancer Neg Hx    Stomach cancer Neg Hx     Prior to Admission medications   Medication Sig Start Date End Date Taking? Authorizing Provider  brimonidine -timolol  (COMBIGAN ) 0.2-0.5 % ophthalmic solution Place 1 drop into the right eye 2 (two) times daily.   Yes [provider]  brinzolamide  (AZOPT ) 1 %  ophthalmic suspension Place 1 drop into the right eye 2 (two) times daily.   Yes [provider]  ondansetron  (ZOFRAN ) 4 MG tablet Take 1 tablet (4 mg total) by mouth every 8 (eight) hours as needed for nausea or vomiting. 04/17/23  Yes Roslyn Coombe, MD  pantoprazole  (PROTONIX ) 40 MG tablet Take 1 tablet (40 mg total) by mouth daily. 04/17/23  Yes Roslyn Coombe, MD  tobramycin (TOBREX) 0.3 % ophthalmic solution Place 1 drop into the right eye as needed (before eye injection). 03/08/19  Yes [provider]  dorzolamide-timolol  (COSOPT) 2-0.5 % ophthalmic solution Place 1 drop into the right eye 2 (two) times daily. Patient not taking: Reported on 06/20/2023 05/28/23   [provider]  Evolocumab  (REPATHA  SURECLICK) 140 MG/ML SOAJ Inject 140 mg into the skin every 14 (fourteen) days. Patient not taking: Reported on 06/20/2023 04/17/23   Roslyn Coombe, MD    Physical Exam: Vitals:   06/20/23 0500 06/20/23 0530 06/20/23 0618 06/20/23 0830  BP: (!) 140/85 139/81 133/72 118/77  Pulse: 93 95 98 100  Resp:  18 18 18   Temp:   97.7 F (36.5 C)   TempSrc:   Oral   SpO2: 97% 96% 98% 95%   Physical Exam Vitals reviewed.  Constitutional:      General: She is awake. She is not in acute distress.    Appearance: She is well-developed. She is ill-appearing.  HENT:     Head: Normocephalic.     Nose: No rhinorrhea.     Mouth/Throat:     Mouth: Mucous membranes are moist.  Eyes:     General: No scleral icterus.    Pupils: Pupils are equal, round, and reactive to light.  Neck:     Vascular: No JVD.  Cardiovascular:     Rate and Rhythm: Normal rate and regular rhythm.     Heart sounds: S1 normal and S2 normal.  Pulmonary:     Effort: Pulmonary effort is normal.     Breath sounds: Normal breath sounds. No wheezing, rhonchi or rales.  Abdominal:     General: Abdomen is flat. Bowel sounds are normal. There is no distension.     Palpations: Abdomen is soft.     Tenderness: There  is no abdominal tenderness. There is no right CVA tenderness or left CVA tenderness.  Musculoskeletal:     Cervical back: Neck supple.     Right lower leg: No edema.     Left lower leg: No edema.  Skin:    General: Skin is warm and dry.  Neurological:     General: No focal deficit present.     Mental Status: She is alert and oriented to person, place, and time.     Cranial Nerves: No cranial nerve deficit.     Sensory: No sensory deficit.  Motor: No weakness.  Psychiatric:        Behavior: Behavior is cooperative.    Data Reviewed:  Results are pending, will review when available.  Assessment and Plan: Principal Problem:   UTI (urinary tract infection) In the setting of:   Intraabdominal mass  With associated   Elevated lipase Observation/telemetry. Analgesics as needed. Antiemetics as needed. Pantoprazole  40 mg IVP daily. Follow CBC, CMP in AM. IR has been consulted. -For US  guided biopsy showed liver mets. IR has also recommended urology consult. - Seen by Dr.  Geralyn Knee McDearmid.  Active Problems:   Syncope Transfer to telemetry. Gentle IV hydration. Correct electrolyte abnormality. Check carotid Doppler. Check echocardiogram.    Pure hypercholesterolemia Currently not on therapy. Has allergy to statins. Follow-up with primary care provider.    Normocytic anemia In the setting of malignancy. Monitor hematocrit hemoglobin. Transfuse as needed.    GERD Continue pantoprazole  40 mg p.o. daily.    Primary open-angle glaucoma, bilateral, moderate stage Continue Combigan  and a stop drops. Follow-up with ophthalmology as an outpatient.    Hyperglycemia Check fasting glucose in a.m.    B12 deficiency Begin cyanocobalamin  1000 mcg p.o. daily.    Vitamin D  deficiency Begin supplementation.    Advance Care Planning:   Code Status: Full Code   Consults: IR (Dr. Baldemar Lev) and urology (Dr. Geralyn Knee McDearmid).  Family Communication:   Severity of  Illness: The appropriate patient status for this patient is OBSERVATION. Observation status is judged to be reasonable and necessary in order to provide the required intensity of service to ensure the patient's safety. The patient's presenting symptoms, physical exam findings, and initial radiographic and laboratory data in the context of their medical condition is felt to place them at decreased risk for further clinical deterioration. Furthermore, it is anticipated that the patient will be medically stable for discharge from the hospital within 2 midnights of admission.   Author: Danice Dural, MD 06/20/2023 10:11 AM  For on call review [email or web address].   This document was prepared using Dragon voice recognition software and may contain some unintended transcription errors.

## 2023-06-21 ENCOUNTER — Inpatient Hospital Stay (HOSPITAL_COMMUNITY)

## 2023-06-21 DIAGNOSIS — R55 Syncope and collapse: Secondary | ICD-10-CM

## 2023-06-21 DIAGNOSIS — R1909 Other intra-abdominal and pelvic swelling, mass and lump: Secondary | ICD-10-CM | POA: Diagnosis not present

## 2023-06-21 LAB — COMPREHENSIVE METABOLIC PANEL WITH GFR
ALT: 11 U/L (ref 0–44)
AST: 19 U/L (ref 15–41)
Albumin: 3.2 g/dL — ABNORMAL LOW (ref 3.5–5.0)
Alkaline Phosphatase: 52 U/L (ref 38–126)
Anion gap: 8 (ref 5–15)
BUN: 20 mg/dL (ref 8–23)
CO2: 22 mmol/L (ref 22–32)
Calcium: 8.4 mg/dL — ABNORMAL LOW (ref 8.9–10.3)
Chloride: 104 mmol/L (ref 98–111)
Creatinine, Ser: 0.93 mg/dL (ref 0.44–1.00)
GFR, Estimated: 60 mL/min — ABNORMAL LOW (ref >60)
Glucose, Bld: 94 mg/dL (ref 70–99)
Potassium: 3.9 mmol/L (ref 3.5–5.1)
Sodium: 134 mmol/L — ABNORMAL LOW (ref 135–145)
Total Bilirubin: 0.5 mg/dL (ref 0.0–1.2)
Total Protein: 5.8 g/dL — ABNORMAL LOW (ref 6.5–8.1)

## 2023-06-21 LAB — CBC
HCT: 30.7 % — ABNORMAL LOW (ref 36.0–46.0)
Hemoglobin: 9.5 g/dL — ABNORMAL LOW (ref 12.0–15.0)
MCH: 28.9 pg (ref 26.0–34.0)
MCHC: 30.9 g/dL (ref 30.0–36.0)
MCV: 93.3 fL (ref 80.0–100.0)
Platelets: 215 10*3/uL (ref 150–400)
RBC: 3.29 MIL/uL — ABNORMAL LOW (ref 3.87–5.11)
RDW: 13.5 % (ref 11.5–15.5)
WBC: 11.8 10*3/uL — ABNORMAL HIGH (ref 4.0–10.5)
nRBC: 0 % (ref 0.0–0.2)

## 2023-06-21 LAB — GLUCOSE, CAPILLARY: Glucose-Capillary: 89 mg/dL (ref 70–99)

## 2023-06-21 LAB — PROTIME-INR
INR: 1.1 (ref 0.8–1.2)
Prothrombin Time: 13.9 s (ref 11.4–15.2)

## 2023-06-21 LAB — ECHOCARDIOGRAM COMPLETE
AR max vel: 2.59 cm2
AV Peak grad: 3.9 mmHg
Ao pk vel: 0.99 m/s
Height: 65 in
MV VTI: 2.85 cm2
P 1/2 time: 543 ms
S' Lateral: 3 cm
Weight: 1872 [oz_av]

## 2023-06-21 MED ORDER — POLYVINYL ALCOHOL 1.4 % OP SOLN
1.0000 [drp] | OPHTHALMIC | Status: DC | PRN
  Administered 2023-06-21: 1 [drp] via OPHTHALMIC
  Filled 2023-06-21: qty 15

## 2023-06-21 NOTE — Consult Note (Signed)
 Chief Complaint: Recent abdominal pain, back pain, right renal mass,?  bladder mass, liver lesions; referred for image guided liver lesion biopsy  Referring Provider(s): Ortiz,D  Supervising Physician: Art Largo  Patient Status: [facility] - In-pt  History of Present Illness: Hannah Madden is an 89 y.o. female past medical history significant for GERD, glaucoma, hiatal hernia, hyperlipidemia, vitamin D  deficiency, B12 deficiency, anxiety recently admitted to [facility] long with abdominal/back pain, fatigue, malaise, body aches, nausea, recent syncopal episode, urinary frequency.  CT of the abdomen pelvis revealed enhancing mass in the superior pole of the right kidney concerning for renal cell carcinoma, multiple large hepatic metastasis, mesenteric mass or lymphadenopathy in left central mesentery, ? bladder mass. No prior history of cancer.  Request now received for image guided liver lesion biopsy for further evaluation.   Patient is Full Code  Past Medical History:  Diagnosis Date   Alopecia    stress related and resolved since her Mother moved to SNF   GERD (gastroesophageal reflux disease)    takes Nexium  daily   Glaucoma    uses eye drops daily   H/O hiatal hernia    Hypercholesteremia    takes Simvastatin  daily   Pneumonia 03/03/1958   Vitamin D  deficiency 12/24/2019    Past Surgical History:  Procedure Laterality Date   ABDOMINAL HYSTERECTOMY     fibroids.   COLONOSCOPY     KNEE ARTHROSCOPY WITH MEDIAL MENISECTOMY Left 09/12/2013   Procedure: KNEE ARTHROSCOPY WITH DEBRIDEMENT;  Surgeon: Jasmine Mesi, MD;  Location: [facility] OR;  Service: Orthopedics;  Laterality: Left;    Allergies: Atorvastatin, Macrobid [nitrofurantoin], Penicillins, and Ciprofloxacin   Medications: Prior to Admission medications   Medication Sig Start Date End Date Taking? Authorizing Provider  brimonidine -timolol  (COMBIGAN ) 0.2-0.5 % ophthalmic solution Place 1 drop into the right eye 2 (two)  times daily.   Yes [provider]  brinzolamide  (AZOPT ) 1 % ophthalmic suspension Place 1 drop into the right eye 2 (two) times daily.   Yes [provider]  ondansetron  (ZOFRAN ) 4 MG tablet Take 1 tablet (4 mg total) by mouth every 8 (eight) hours as needed for nausea or vomiting. 04/17/23  Yes Roslyn Coombe, MD  pantoprazole  (PROTONIX ) 40 MG tablet Take 1 tablet (40 mg total) by mouth daily. 04/17/23  Yes Roslyn Coombe, MD  tobramycin (TOBREX) 0.3 % ophthalmic solution Place 1 drop into the right eye as needed (before eye injection). 03/08/19  Yes [provider]  dorzolamide-timolol  (COSOPT) 2-0.5 % ophthalmic solution Place 1 drop into the right eye 2 (two) times daily. Patient not taking: Reported on 06/20/2023 05/28/23   [provider]  Evolocumab  (REPATHA  SURECLICK) 140 MG/ML SOAJ Inject 140 mg into the skin every 14 (fourteen) days. Patient not taking: Reported on 06/20/2023 04/17/23   Roslyn Coombe, MD     Family History  Problem Relation Age of Onset   COPD Neg Hx    Diabetes Neg Hx    Heart disease Neg Hx    Stroke Neg Hx    Colon cancer Neg Hx    Esophageal cancer Neg Hx    Pancreatic cancer Neg Hx    Stomach cancer Neg Hx     Social History   Socioeconomic History   Marital status: Widowed    Spouse name: Not on file   Number of children: 4   Years of education: 16+   Highest education level: Not on file  Occupational History   Occupation:  financial services    Comment: owner/operator, now emeritus  Tobacco Use   Smoking status: Never   Smokeless tobacco: Never  Vaping Use   Vaping status: Never Used  Substance and Sexual Activity   Alcohol  use: No   Drug use: No   Sexual activity: Yes    Partners: Female    Birth control/protection: Surgical  Other Topics Concern   Not on file  Social History Narrative   [employer], [employer], [employer], [employer] - no degree. Married '56. 2 sons - '64, '71; 2 dtrs  - '57, '59; 6 grandchildren. 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 She goes to the gym regularly, bowls, takes classes. ACP - discussed the "gift of Love" of ACP.   Social Drivers of Corporate investment banker Strain: Not on file  Food Insecurity: No Food Insecurity (06/20/2023)   Hunger Vital Sign    Worried About Running Out of Food in the Last Year: Never true    Ran Out of Food in the Last Year: Never true  Transportation Needs: No Transportation Needs (06/20/2023)   PRAPARE - Administrator, Civil Service (Medical): No    Lack of Transportation (Non-Medical): No  Physical Activity: Not on file  Stress: Not on file  Social Connections: Moderately Integrated (06/20/2023)   Social Connection and Isolation Panel [NHANES]    Frequency of Communication with Friends and Family: Three times a week    Frequency of Social Gatherings with Friends and Family: More than three times a week    Attends Religious Services: More than 4 times per year    Active Member of Clubs or Organizations: Yes    Attends Banker Meetings: More than 4 times per year    Marital Status: Widowed       Review of Systems currently denies fever, headache, chest pain, dyspnea, cough, abdominal/back pain, nausea, vomiting or bleeding  Vital Signs: BP 130/65 (BP Location: Left Arm)   Pulse 78   Temp 98.3 F (36.8 C)   Resp 15   Ht 5\' 5"  (1.651 m)   Wt 117 lb (53.1 kg)   SpO2 99%   BMI 19.47 kg/m   Advance Care Plan: No documents on file    Physical Exam: Awake, alert.  Chest clear to auscultation bilaterally.  Heart with regular rate and rhythm.  Abdomen soft, positive bowel sounds, nontender, no lower extremity edema.  Imaging: VAS US  CAROTID Result Date: 06/20/2023 Carotid Arterial Duplex Study Patient Name:  Hannah Madden  Date of Exam:   06/20/2023 Medical Rec #: [id number]         Accession #:    [id number] Date of Birth:  1934/05/02        Patient Gender: F Patient Age:   41 years Exam Location:  [facility] Procedure:      VAS US  CAROTID Referring Phys: DAVID ORTIZ --------------------------------------------------------------------------------  Indications:       Syncope. Risk Factors:      Hyperlipidemia. Comparison Study:  No prior exam on file. Performing Technologist: Ria Chad  Examination Guidelines: A complete evaluation includes B-mode imaging, spectral Doppler, color Doppler, and power Doppler as needed of all accessible portions of each vessel. Bilateral testing is considered an integral part of a complete examination. Limited examinations for reoccurring indications may be performed as noted.  Right Carotid Findings: +----------+--------+--------+--------+-------------------------+--------+  PSV cm/sEDV cm/sStenosisPlaque Description       Comments +----------+--------+--------+--------+-------------------------+--------+ CCA Prox  122     15                                                +----------+--------+--------+--------+-------------------------+--------+ CCA Distal63      12                                                +----------+--------+--------+--------+-------------------------+--------+ ICA Prox  60      13              heterogenous and calcific         +----------+--------+--------+--------+-------------------------+--------+ ICA Mid   93      23                                                +----------+--------+--------+--------+-------------------------+--------+ ICA Distal150     26                                       tortuous +----------+--------+--------+--------+-------------------------+--------+ ECA       80      12                                                +----------+--------+--------+--------+-------------------------+--------+ +----------+--------+-------+--------+-------------------+           PSV cm/sEDV  cmsDescribeArm Pressure (mmHG) +----------+--------+-------+--------+-------------------+ [id number]                                        +----------+--------+-------+--------+-------------------+ +---------+--------+--+--------+--+ VertebralPSV cm/s60EDV cm/s10 +---------+--------+--+--------+--+ Slightly elevated velocities due to tortuosity. Left Carotid Findings: +----------+--------+--------+--------+------------------+--------+           PSV cm/sEDV cm/sStenosisPlaque DescriptionComments +----------+--------+--------+--------+------------------+--------+ CCA Prox  136     17                                         +----------+--------+--------+--------+------------------+--------+ CCA Distal78      12                                         +----------+--------+--------+--------+------------------+--------+ ICA Prox  81      12                                         +----------+--------+--------+--------+------------------+--------+ ICA Mid   78      15                                         +----------+--------+--------+--------+------------------+--------+  ICA Distal123     24                                tortuous +----------+--------+--------+--------+------------------+--------+ ECA       76      16                                         +----------+--------+--------+--------+------------------+--------+ +----------+--------+--------+--------+-------------------+           PSV cm/sEDV cm/sDescribeArm Pressure (mmHG) +----------+--------+--------+--------+-------------------+ Subclavian122                                         +----------+--------+--------+--------+-------------------+ +---------+--------+--+--------+--+ VertebralPSV cm/s72EDV cm/s14 +---------+--------+--+--------+--+ Slightly elevated velocities due to tortuosity.  Summary: Right Carotid: Velocities in the right ICA are consistent with a 1-39%  stenosis. Left Carotid: Velocities in the left ICA are consistent with a 1-39% stenosis.               Hemodynamically significant plaque >50% visualized in the CCA. Vertebrals:  Bilateral vertebral arteries demonstrate antegrade flow. Subclavians: Normal flow hemodynamics were seen in bilateral subclavian              arteries. *See table(s) above for measurements and observations.     Preliminary    CT ABDOMEN PELVIS W CONTRAST Result Date: 06/20/2023 CLINICAL DATA:  Acute nonlocalized abdominal pain EXAM: CT ABDOMEN AND PELVIS WITH CONTRAST TECHNIQUE: Multidetector CT imaging of the abdomen and pelvis was performed using the standard protocol following bolus administration of intravenous contrast. RADIATION DOSE REDUCTION: This exam was performed according to the departmental dose-optimization program which includes automated exposure control, adjustment of the mA and/or kV according to patient size and/or use of iterative reconstruction technique. CONTRAST:  OMNIPAQUE  IOHEXOL  300 MG/ML  SOLN COMPARISON:  None Available. FINDINGS: Lower chest: No acute abnormality. Hepatobiliary: Multiple peripherally enhancing centrally hypoattenuating heterogenous lesions throughout the liver. These measure up to 10.6 cm in the right hepatic lobe and 5.7 cm in the left hepatic lobe. Unremarkable gallbladder and biliary tree. Pancreas: Unremarkable. Spleen: Unremarkable. Adrenals/Urinary Tract: Normal adrenal glands. Unremarkable left kidney. Heterogenous enhancing mass in the superior pole of the right kidney measuring 3.5 cm (series 5/image 22). No urinary calculi or hydronephrosis. Distended bladder. Stomach/Bowel: Stomach is within normal limits. No bowel wall thickening or bowel obstruction. Vascular/Lymphatic: Aortic atherosclerotic calcification. Mesenteric mass or lymphadenopathy in the left central mesentery measuring 3.9 cm (series 3/image 33). Reproductive: Hysterectomy.  No adnexal mass. Other: No free  intraperitoneal fluid or air. Musculoskeletal: No acute fracture or destructive osseous lesion. IMPRESSION: 1. Heterogenous enhancing mass in the superior pole of the right kidney measuring 3.5 cm, concerning for renal cell carcinoma. 2. Multiple large hepatic metastases. 3. Mesenteric mass or lymphadenopathy in the left central mesentery measuring 3.9 cm. 4. Aortic Atherosclerosis (ICD10-I70.0). Electronically Signed   By: Rozell Cornet M.D.   On: 06/20/2023 01:54    Labs:  CBC: Recent Labs    04/17/23 1440 06/19/23 2056 06/20/23 1152 06/21/23 0530  WBC 7.9 13.0* 14.2* 11.8*  HGB 10.3* 10.3* 12.7 9.5*  HCT 31.1* 31.4* 40.4 30.7*  PLT 232.0 249 240 215    COAGS: Recent Labs    06/21/23 0530  INR 1.1  BMP: Recent Labs    04/17/23 1440 06/19/23 2056 06/20/23 1152 06/21/23 0530  NA 139 133* 136 134*  K 4.2 3.9 4.1 3.9  CL 104 101 103 104  CO2 26 22 20* 22  GLUCOSE 95 109* 121* 94  BUN 17 18 17 20   CALCIUM  9.1 9.2 9.3 8.4*  CREATININE 0.90 0.81 0.81 0.93  GFRNONAA  --  >60 >60 60*    LIVER FUNCTION TESTS: Recent Labs    04/17/23 1440 06/19/23 2056 06/20/23 1152 06/21/23 0530  BILITOT 0.4 0.4 0.9 0.5  AST 17 24 23 19   ALT 11 9 11 11   ALKPHOS 74 71 62 52  PROT 6.8 6.7 6.7 5.8*  ALBUMIN 4.2 4.2 3.7 3.2*    TUMOR MARKERS: No results for input(s): "AFPTM", "CEA", "CA199", "CHROMGRNA" in the last 8760 hours.  Assessment and Plan: 89 y.o. female past medical history significant for GERD, glaucoma, hiatal hernia, hyperlipidemia, vitamin D  deficiency, B12 deficiency, anxiety recently admitted to [facility] long with abdominal/back pain, fatigue, malaise, body aches, nausea, recent syncopal episode, urinary frequency.  CT of the abdomen pelvis revealed enhancing mass in the superior pole of the right kidney concerning for renal cell carcinoma, multiple large hepatic metastasis, mesenteric mass or lymphadenopathy in left central mesentery, ? bladder mass. No prior  history of cancer.  Request now received for image guided liver lesion biopsy for further evaluation.  Imaging studies have been reviewed by Dr. Marne Sings.  Patient currently afebrile, WBC 11.8, hemoglobin 9.5, platelets normal, PT/INR normal, creatinine normal, total bilirubin normal ; Details/risks and benefits of procedure was discussed with the patient /daughter  including, but not limited to bleeding, infection, damage to adjacent structures or low yield requiring additional tests.  All of the questions were answered and there is agreement to proceed.  Consent signed and in chart.  Procedure scheduled for 4/21.    Thank you for allowing our service to participate in Hannah Madden 's care.  Electronically Signed: D. Honore Lux, PA-C   06/21/2023, 9:22 AM      I spent a total of 40 Minutes    in face to face in clinical consultation, greater than 50% of which was counseling/coordinating care for image guided liver lesion biopsy

## 2023-06-21 NOTE — Plan of Care (Signed)

## 2023-06-21 NOTE — Progress Notes (Signed)
 PROGRESS NOTE    [facility]  NWG:[id number]  DOB: [birth year]  DOA: 06/19/2023 PCP: Roslyn Coombe, MD Outpatient Specialists:   Hospital course:  89 year old woman otherwise healthy with only GERD was evaluated for back pain and malaise.  Workup reveals 3.5 cm renal mass, 4 cm mesenteric mass and multiple liver nodules.  She is also noted to have a UTI.  While in the ED patient had presyncope/syncope requiring rapid response.  Subjective:  Patient with attentive daughter at bedside, had multiple questions about workup of multiple masses.   Objective: Vitals:   06/21/23 0449 06/21/23 0450 06/21/23 0451 06/21/23 1557  BP: 130/65   130/70  Pulse: 78   84  Resp: 15   18  Temp: 98.3 F (36.8 C)   98 F (36.7 C)  TempSrc:      SpO2: 99%   100%  Weight:  53.1 kg    Height:   5\' 5"  (1.651 m)     Intake/Output Summary (Last 24 hours) at 06/21/2023 1738 Last data filed at 06/21/2023 1100 Gross per 24 hour  Intake 1244.55 ml  Output --  Net 1244.55 ml   Filed Weights   06/21/23 0450  Weight: 53.1 kg     Exam:  General: Thin female sitting up in bed with loving daughter at bedside Eyes: sclera anicteric, conjuctiva mild injection bilaterally CVS: S1-S2, regular  Respiratory:  decreased air entry bilaterally secondary to decreased inspiratory effort, rales at bases  GI: NABS, soft, NT  LE: Decreased muscle mass Neuro: A/O x 3,  grossly nonfocal.  Psych: patient is logical and coherent, judgement and insight appear normal, mood and affect appropriate to situation.  Data Reviewed:  Basic Metabolic Panel: Recent Labs  Lab 06/19/23 2056 06/20/23 1152 06/21/23 0530  NA 133* 136 134*  K 3.9 4.1 3.9  CL 101 103 104  CO2 22 20* 22  GLUCOSE 109* 121* 94  BUN 18 17 20   CREATININE 0.81 0.81 0.93  CALCIUM  9.2 9.3 8.4*  MG  --  1.7  --   PHOS  --  4.1  --     CBC: Recent Labs  Lab 06/19/23 2056 06/20/23 1152 06/21/23 0530  WBC 13.0* 14.2* 11.8*   NEUTROABS 8.1*  --   --   HGB 10.3* 12.7 9.5*  HCT 31.4* 40.4 30.7*  MCV 88.7 93.5 93.3  PLT 249 240 215     Scheduled Meds:  brimonidine   1 drop Both Eyes BID   And   timolol   1 drop Both Eyes BID   brinzolamide   1 drop Both Eyes BID   cholecalciferol   1,000 Units Oral Daily   vitamin B-12  1,000 mcg Oral Daily   pantoprazole   40 mg Oral Daily   sodium chloride  flush  3 mL Intravenous Q12H   Continuous Infusions:  cefTRIAXone  (ROCEPHIN )  IV Stopped (06/21/23 0244)     Assessment & Plan:   3.5 cm renal mass 4 cm mesenteric mass versus lymph node Multiple hepatic nodules Per radiology, renal cell carcinoma does not usually metastasized to liver. There may be a shadow in bladder suggestive of bladder CA, bladder CA does go to the liver. Patient seen by urology, they do feel the renal mass is likely renal cell carcinoma. Plan is for ultrasound-guided liver biopsy, scheduled for tomorrow.  UTI Continue ceftriaxone  day #2  Syncope vs presyncope Witnessed in the ED, rapid response was called Patient was hydrated Patient is on telemetry Echocardiogram ordered Carotid Dopplers  ordered on admission  GERD Continue PPI  Glaucoma Continue Combigan    DVT prophylaxis: SCD Code Status: Full Family Communication: Daughter at bedside throughout     Studies: ECHOCARDIOGRAM COMPLETE Result Date: 06/21/2023    ECHOCARDIOGRAM REPORT   Patient Name:   Hannah Madden Date of Exam: 06/21/2023 Medical Rec #:  [id number]        Height:       65.0 in Accession #:    [id number]       Weight:       117.0 lb Date of Birth:  Apr 13, 1934       BSA:          1.575 m Patient Age:    88 years         BP:           130/65 mmHg Patient Gender: F                HR:           85 bpm. Exam Location:  Inpatient Procedure: 2D Echo, Cardiac Doppler and Color Doppler (Both Spectral and Color            Flow Doppler were utilized during procedure). Indications:    Syncope  History:        Patient has  no prior history of Echocardiogram examinations.  Sonographer:    Willey Harrier Referring Phys: [id number] Hannah Madden  Sonographer Comments: Technically difficult study due to poor echo windows. Image acquisition challenging due to patient body habitus. IMPRESSIONS  1. Left ventricular ejection fraction, by estimation, is 60 to 65%. The left ventricle has normal function. The left ventricle has no regional wall motion abnormalities. Left ventricular diastolic parameters are indeterminate.  2. Right ventricular systolic function is normal. The right ventricular size is normal.  3. The mitral valve is normal in structure. No evidence of mitral valve regurgitation. No evidence of mitral stenosis.  4. The aortic valve is normal in structure. Aortic valve regurgitation is mild to moderate. Aortic valve sclerosis/calcification is present, without any evidence of aortic stenosis.  5. The inferior vena cava is normal in size with greater than 50% respiratory variability, suggesting right atrial pressure of 3 mmHg. FINDINGS  Left Ventricle: Left ventricular ejection fraction, by estimation, is 60 to 65%. The left ventricle has normal function. The left ventricle has no regional wall motion abnormalities. The left ventricular internal cavity size was normal in size. There is  no left ventricular hypertrophy. Left ventricular diastolic parameters are indeterminate. Right Ventricle: The right ventricular size is normal. No increase in right ventricular wall thickness. Right ventricular systolic function is normal. Left Atrium: Left atrial size was normal in size. Right Atrium: Right atrial size was normal in size. Pericardium: Trivial pericardial effusion is present. The pericardial effusion is circumferential and anterior to the right ventricle. Mitral Valve: The mitral valve is normal in structure. No evidence of mitral valve regurgitation. No evidence of mitral valve stenosis. MV peak gradient, 3.8 mmHg. The mean mitral  valve gradient is 1.0 mmHg. Tricuspid Valve: The tricuspid valve is normal in structure. Tricuspid valve regurgitation is trivial. No evidence of tricuspid stenosis. Aortic Valve: The aortic valve is normal in structure. Aortic valve regurgitation is mild to moderate. Aortic regurgitation PHT measures 543 msec. Aortic valve sclerosis/calcification is present, without any evidence of aortic stenosis. Aortic valve peak  gradient measures 3.9 mmHg. Pulmonic Valve: The pulmonic valve was normal in structure. Pulmonic valve regurgitation is not  visualized. No evidence of pulmonic stenosis. Aorta: The aortic root is normal in size and structure. Venous: The inferior vena cava is normal in size with greater than 50% respiratory variability, suggesting right atrial pressure of 3 mmHg. IAS/Shunts: No atrial level shunt detected by color flow Doppler.  LEFT VENTRICLE PLAX 2D LVIDd:         4.30 cm   Diastology LVIDs:         3.00 cm   LV e' medial:  8.05 cm/s LV PW:         0.70 cm   LV e' lateral: 11.10 cm/s LV IVS:        0.70 cm LVOT diam:     2.00 cm LV SV:         56 LV SV Index:   36 LVOT Area:     3.14 cm  RIGHT VENTRICLE             IVC RV Basal diam:  3.00 cm     IVC diam: 1.20 cm RV S prime:     11.60 cm/s TAPSE (M-mode): 2.0 cm LEFT ATRIUM           Index        RIGHT ATRIUM          Index LA Vol (A2C): 19.9 ml 12.63 ml/m  RA Area:     7.01 cm LA Vol (A4C): 16.8 ml 10.67 ml/m  RA Volume:   11.60 ml 7.36 ml/m  AORTIC VALVE AV Area (Vmax): 2.59 cm AV Vmax:        98.90 cm/s AV Peak Grad:   3.9 mmHg LVOT Vmax:      81.60 cm/s LVOT Vmean:     61.100 cm/s LVOT VTI:       0.178 m AI PHT:         543 msec  AORTA Ao Root diam: 3.20 cm Ao Asc diam:  2.70 cm MITRAL VALVE             TRICUSPID VALVE MV Area VTI:  2.85 cm   TR Peak grad:   25.8 mmHg MV Peak grad: 3.8 mmHg   TR Vmax:        254.00 cm/s MV Mean grad: 1.0 mmHg MV Vmax:      0.97 m/s   SHUNTS MV Vmean:     56.7 cm/s  Systemic VTI:  0.18 m                           Systemic Diam: 2.00 cm Kardie Tobb DO Electronically signed by Jerryl Morin DO Signature Date/Time: 06/21/2023/11:05:47 AM    Final    VAS US  CAROTID Result Date: 06/21/2023 Carotid Arterial Duplex Study Patient Name:  LUDDIE BOGHOSIAN Dena  Date of Exam:   06/20/2023 Medical Rec #: [id number]         Accession #:    [id number] Date of Birth: Jan 29, 1935        Patient Gender: F Patient Age:   42 years Exam Location:  [facility] Procedure:      VAS US  CAROTID Referring Phys: Hannah Madden --------------------------------------------------------------------------------  Indications:       Syncope. Risk Factors:      Hyperlipidemia. Comparison Study:  No prior exam on file. Performing Technologist: Ria Chad  Examination Guidelines: A complete evaluation includes B-mode imaging, spectral Doppler, color Doppler, and power Doppler as needed of all accessible portions of each vessel.  Bilateral testing is considered an integral part of a complete examination. Limited examinations for reoccurring indications may be performed as noted.  Right Carotid Findings: +----------+--------+--------+--------+-------------------------+--------+           PSV cm/sEDV cm/sStenosisPlaque Description       Comments +----------+--------+--------+--------+-------------------------+--------+ CCA Prox  122     15                                                +----------+--------+--------+--------+-------------------------+--------+ CCA Distal63      12                                                +----------+--------+--------+--------+-------------------------+--------+ ICA Prox  60      13              heterogenous and calcific         +----------+--------+--------+--------+-------------------------+--------+ ICA Mid   93      23                                                +----------+--------+--------+--------+-------------------------+--------+ ICA Distal150     26                                        tortuous +----------+--------+--------+--------+-------------------------+--------+ ECA       80      12                                                +----------+--------+--------+--------+-------------------------+--------+ +----------+--------+-------+--------+-------------------+           PSV cm/sEDV cmsDescribeArm Pressure (mmHG) +----------+--------+-------+--------+-------------------+ [id number]                                        +----------+--------+-------+--------+-------------------+ +---------+--------+--+--------+--+ VertebralPSV cm/s60EDV cm/s10 +---------+--------+--+--------+--+ Slightly elevated velocities due to tortuosity. Left Carotid Findings: +----------+--------+--------+--------+------------------+--------+           PSV cm/sEDV cm/sStenosisPlaque DescriptionComments +----------+--------+--------+--------+------------------+--------+ CCA Prox  136     17                                         +----------+--------+--------+--------+------------------+--------+ CCA Distal78      12                                         +----------+--------+--------+--------+------------------+--------+ ICA Prox  81      12                                         +----------+--------+--------+--------+------------------+--------+  ICA Mid   78      15                                         +----------+--------+--------+--------+------------------+--------+ ICA Distal123     24                                tortuous +----------+--------+--------+--------+------------------+--------+ ECA       76      16                                         +----------+--------+--------+--------+------------------+--------+ +----------+--------+--------+--------+-------------------+           PSV cm/sEDV cm/sDescribeArm Pressure (mmHG) +----------+--------+--------+--------+-------------------+  Subclavian122                                         +----------+--------+--------+--------+-------------------+ +---------+--------+--+--------+--+ VertebralPSV cm/s72EDV cm/s14 +---------+--------+--+--------+--+ Slightly elevated velocities due to tortuosity.  Summary: Right Carotid: Velocities in the right ICA are consistent with a 1-39% stenosis. Left Carotid: Velocities in the left ICA are consistent with a 1-39% stenosis.               Hemodynamically significant plaque >50% visualized in the CCA. Vertebrals:  Bilateral vertebral arteries demonstrate antegrade flow. Subclavians: Normal flow hemodynamics were seen in bilateral subclavian              arteries. *See table(s) above for measurements and observations.  Electronically signed by Ardella Beaver MD on 06/21/2023 at 10:50:00 AM.    Final    CT ABDOMEN PELVIS W CONTRAST Result Date: 06/20/2023 CLINICAL DATA:  Acute nonlocalized abdominal pain EXAM: CT ABDOMEN AND PELVIS WITH CONTRAST TECHNIQUE: Multidetector CT imaging of the abdomen and pelvis was performed using the standard protocol following bolus administration of intravenous contrast. RADIATION DOSE REDUCTION: This exam was performed according to the departmental dose-optimization program which includes automated exposure control, adjustment of the mA and/or kV according to patient size and/or use of iterative reconstruction technique. CONTRAST:  OMNIPAQUE  IOHEXOL  300 MG/ML  SOLN COMPARISON:  None Available. FINDINGS: Lower chest: No acute abnormality. Hepatobiliary: Multiple peripherally enhancing centrally hypoattenuating heterogenous lesions throughout the liver. These measure up to 10.6 cm in the right hepatic lobe and 5.7 cm in the left hepatic lobe. Unremarkable gallbladder and biliary tree. Pancreas: Unremarkable. Spleen: Unremarkable. Adrenals/Urinary Tract: Normal adrenal glands. Unremarkable left kidney. Heterogenous enhancing mass in the superior pole of the right  kidney measuring 3.5 cm (series 5/image 22). No urinary calculi or hydronephrosis. Distended bladder. Stomach/Bowel: Stomach is within normal limits. No bowel wall thickening or bowel obstruction. Vascular/Lymphatic: Aortic atherosclerotic calcification. Mesenteric mass or lymphadenopathy in the left central mesentery measuring 3.9 cm (series 3/image 33). Reproductive: Hysterectomy.  No adnexal mass. Other: No free intraperitoneal fluid or air. Musculoskeletal: No acute fracture or destructive osseous lesion. IMPRESSION: 1. Heterogenous enhancing mass in the superior pole of the right kidney measuring 3.5 cm, concerning for renal cell carcinoma. 2. Multiple large hepatic metastases. 3. Mesenteric mass or lymphadenopathy in the left central mesentery measuring 3.9 cm. 4. Aortic Atherosclerosis (ICD10-I70.0). Electronically Signed   By: Christell Cove.D.  On: 06/20/2023 01:54    Principal Problem:   UTI (urinary tract infection) Active Problems:   Pure hypercholesterolemia   Normocytic anemia   GERD   Primary open-angle glaucoma, bilateral, moderate stage   Hyperglycemia   Vitamin D  deficiency   Syncope   B12 deficiency   Elevated lipase   Intraabdominal mass     Vlada Uriostegui Rollene Clink, [facility]  If 7PM-7AM, please contact night-coverage [email or web address]   LOS: 1 day

## 2023-06-22 ENCOUNTER — Ambulatory Visit: Admitting: Cardiology

## 2023-06-22 ENCOUNTER — Inpatient Hospital Stay (HOSPITAL_COMMUNITY)

## 2023-06-22 DIAGNOSIS — N3000 Acute cystitis without hematuria: Secondary | ICD-10-CM | POA: Diagnosis not present

## 2023-06-22 LAB — CBC
HCT: 30.5 % — ABNORMAL LOW (ref 36.0–46.0)
Hemoglobin: 9.7 g/dL — ABNORMAL LOW (ref 12.0–15.0)
MCH: 29.3 pg (ref 26.0–34.0)
MCHC: 31.8 g/dL (ref 30.0–36.0)
MCV: 92.1 fL (ref 80.0–100.0)
Platelets: 192 10*3/uL (ref 150–400)
RBC: 3.31 MIL/uL — ABNORMAL LOW (ref 3.87–5.11)
RDW: 13.4 % (ref 11.5–15.5)
WBC: 10.1 10*3/uL (ref 4.0–10.5)
nRBC: 0 % (ref 0.0–0.2)

## 2023-06-22 LAB — BASIC METABOLIC PANEL WITH GFR
Anion gap: 8 (ref 5–15)
BUN: 17 mg/dL (ref 8–23)
CO2: 23 mmol/L (ref 22–32)
Calcium: 8.8 mg/dL — ABNORMAL LOW (ref 8.9–10.3)
Chloride: 106 mmol/L (ref 98–111)
Creatinine, Ser: 0.78 mg/dL (ref 0.44–1.00)
GFR, Estimated: >60 mL/min (ref >60)
Glucose, Bld: 90 mg/dL (ref 70–99)
Potassium: 3.8 mmol/L (ref 3.5–5.1)
Sodium: 137 mmol/L (ref 135–145)

## 2023-06-22 LAB — GLUCOSE, CAPILLARY: Glucose-Capillary: 96 mg/dL (ref 70–99)

## 2023-06-22 MED ORDER — MELATONIN 5 MG PO TABS
5.0000 mg | ORAL_TABLET | Freq: Every evening | ORAL | Status: DC | PRN
  Administered 2023-06-22 (×2): 5 mg via ORAL
  Filled 2023-06-22 (×2): qty 1

## 2023-06-22 MED ORDER — MIDAZOLAM HCL 2 MG/2ML IJ SOLN
INTRAMUSCULAR | Status: AC | PRN
  Administered 2023-06-22 (×2): 1 mg via INTRAVENOUS

## 2023-06-22 MED ORDER — MIDAZOLAM HCL 2 MG/2ML IJ SOLN
INTRAMUSCULAR | Status: AC
  Filled 2023-06-22: qty 2

## 2023-06-22 MED ORDER — SODIUM CHLORIDE 0.9 % IV SOLN
INTRAVENOUS | Status: AC
Start: 2023-06-22 — End: 2023-06-23

## 2023-06-22 MED ORDER — LIDOCAINE HCL 1 % IJ SOLN
INTRAMUSCULAR | Status: AC
  Filled 2023-06-22: qty 20

## 2023-06-22 MED ORDER — FENTANYL CITRATE (PF) 100 MCG/2ML IJ SOLN
INTRAMUSCULAR | Status: AC
  Filled 2023-06-22: qty 2

## 2023-06-22 MED ORDER — TRAZODONE HCL 50 MG PO TABS
50.0000 mg | ORAL_TABLET | ORAL | Status: AC
  Administered 2023-06-22: 50 mg via ORAL
  Filled 2023-06-22: qty 1

## 2023-06-22 MED ORDER — FENTANYL CITRATE (PF) 100 MCG/2ML IJ SOLN
INTRAMUSCULAR | Status: AC | PRN
  Administered 2023-06-22 (×2): 50 ug via INTRAVENOUS

## 2023-06-22 NOTE — Plan of Care (Signed)

## 2023-06-22 NOTE — Progress Notes (Signed)
 PROGRESS NOTE Hannah Madden [facility]  ZOX:[id number] DOB: Sep 10, 1934 DOA: 06/19/2023 PCP: Roslyn Coombe, MD  Brief Narrative/Hospital Course: 89-year-old woman otherwise healthy with only GERD was evaluated for back pain and malaise.  Workup reveals 3.5 cm renal mass, 4 cm mesenteric mass and multiple liver nodules and also noted to have a UTI. While in the ED patient had presyncope/syncope requiring rapid response Workup revealed renal mass, also mesenteric mass versus lymph node and multiple hepatic nodules.    Subjective: Patient seen and examined Daughter at the bedside alert awake, she is n.p.o. for procedure Overnight afebrile, not hypoxic BP stable Labs reviewed normal WBC count hemoglobin at 9.7 stable, stable BMP.  Assessment and plan: 3.5 cm renal mass 4 cm mesenteric mass versus lymph node Multiple hepatic nodules: Radio on board and per radio RCC does not usually metastasize to liver- and there may be a shadow in bladder suggestive of bladder CA, bladder CA does go to the liver. see seen by uro Dr MacDiadmid-do feel the renal mass is likely renal cell carcinoma. Plan is for ultrasound-guided liver biopsy   UTI Continue ceftriaxone  day #3   Syncope vs presyncope In ED: Hydrated.  Monitoring on telemetry.  Echo with normal EF 60-50% no RWMA no significant valve disease. Carotid duplex right left ICA 1-39% stenosis, hemodynamically significant plaque >50% in CCA- discussed w/ Clark and no need for intervention for incidental lesion and he will plan for Op f/u.  Hemodynamically significant plaque in the left CCA; See above.  GERD Continue PPI   Glaucoma Continue Combigan   Chronic anemia: Hemoglobin holding at 9 g range   Nutrition Status:          DVT prophylaxis: SCDs Start: 06/20/23 0853 Code Status:   Code Status: Full Code Family Communication: plan of care discussed with patient/daughter at bedside. Patient status is: Remains hospitalized because of severity  of illness Level of care: Telemetry   Dispo: The patient is from: home, lives with the daughter            Anticipated disposition: TBD pending biopsy  Objective: Vitals last 24 hrs: Vitals:   06/21/23 1956 06/22/23 0426 06/22/23 0500 06/22/23 1331  BP: 128/67 130/66  118/67  Pulse: 78 83  74  Resp: (!) 21 20  15   Temp: 98.1 F (36.7 C) 98 F (36.7 C)  98.7 F (37.1 C)  TempSrc: Oral Oral    SpO2: 97% 99%  100%  Weight:   51 kg   Height:       Weight change: -2.071 kg  Physical Examination: General exam: alert awake,ill looking thin and frail older than stated age HEENT:Oral mucosa moist, Ear/Nose WNL grossly Respiratory system: Bilaterally diminished BS, no use of accessory muscle Cardiovascular system: S1 & S2 +. Gastrointestinal system: Abdomen soft, NT,ND,BS+ Nervous System: Alert, awake,following commands. Extremities: LE edema neg, moving arms, warm legs Skin: No rashes,warm. MSK: Normal muscle bulk/tone.   Medications reviewed:  Scheduled Meds:  brimonidine   1 drop Both Eyes BID   And   timolol   1 drop Both Eyes BID   brinzolamide   1 drop Both Eyes BID   cholecalciferol   1,000 Units Oral Daily   vitamin B-12  1,000 mcg Oral Daily   pantoprazole   40 mg Oral Daily   sodium chloride  flush  3 mL Intravenous Q12H   Continuous Infusions:  cefTRIAXone  (ROCEPHIN )  IV 1 g (06/22/23 0213)      Diet Order  Diet NPO time specified Except for: Sips with Meds  Diet effective midnight                  Intake/Output Summary (Last 24 hours) at 06/22/2023 1407 Last data filed at 06/22/2023 0949 Gross per 24 hour  Intake 0 ml  Output --  Net 0 ml   Net IO Since Admission: 712.2 mL [06/22/23 1407]  Wt Readings from Last 3 Encounters:  06/22/23 51 kg  04/17/23 52.7 kg  09/18/22 54.4 kg    Unresulted Labs (From admission, onward)    None     Data Reviewed: I have personally reviewed following labs and imaging studies ( see epic result  tab) CBC: Recent Labs  Lab 06/19/23 2056 06/20/23 1152 06/21/23 0530 06/22/23 0518  WBC 13.0* 14.2* 11.8* 10.1  NEUTROABS 8.1*  --   --   --   HGB 10.3* 12.7 9.5* 9.7*  HCT 31.4* 40.4 30.7* 30.5*  MCV 88.7 93.5 93.3 92.1  PLT 249 240 215 192   CMP: Recent Labs  Lab 06/19/23 2056 06/20/23 1152 06/21/23 0530 06/22/23 0518  NA 133* 136 134* 137  K 3.9 4.1 3.9 3.8  CL 101 103 104 106  CO2 22 20* 22 23  GLUCOSE 109* 121* 94 90  BUN 18 17 20 17   CREATININE 0.81 0.81 0.93 0.78  CALCIUM  9.2 9.3 8.4* 8.8*  MG  --  1.7  --   --   PHOS  --  4.1  --   --    GFR: Estimated Creatinine Clearance: 40.6 mL/min (by C-G formula based on SCr of 0.78 mg/dL). Recent Labs  Lab 06/19/23 2056 06/20/23 1152 06/21/23 0530  AST 24 23 19   ALT 9 11 11   ALKPHOS 71 62 52  BILITOT 0.4 0.9 0.5  PROT 6.7 6.7 5.8*  ALBUMIN 4.2 3.7 3.2*   Recent Labs  Lab 06/19/23 2056  LIPASE 52*   No results for input(s): "AMMONIA" in the last 168 hours. Coagulation Profile:  Recent Labs  Lab 06/21/23 0530  INR 1.1   No results for input(s): "PROBNP" in the last 168 hours.  No results for input(s): "HGBA1C" in the last 72 hours. Recent Labs  Lab 06/21/23 0447 06/22/23 0520  GLUCAP 89 96   No results for input(s): "CHOL", "HDL", "LDLCALC", "TRIG", "CHOLHDL", "LDLDIRECT" in the last 72 hours. No results for input(s): "TSH", "T4TOTAL", "FREET4", "T3FREE", "THYROIDAB" in the last 72 hours. Sepsis Labs:No results for input(s): "PROCALCITON", "LATICACIDVEN" in the last 168 hours. No results found for this or any previous visit (from the past 240 hours).  Antimicrobials/Microbiology: Anti-infectives (From admission, onward)    Start     Dose/Rate Route Frequency Ordered Stop   06/21/23 0200  cefTRIAXone  (ROCEPHIN ) 1 g in sodium chloride  0.9 % 100 mL IVPB        1 g 200 mL/hr over 30 Minutes Intravenous Every 24 hours 06/20/23 0826     06/20/23 0215  cefTRIAXone  (ROCEPHIN ) 2 g in sodium chloride  0.9 %  100 mL IVPB        2 g 200 mL/hr over 30 Minutes Intravenous  Once 06/20/23 0202 06/20/23 0325      No results found for: "SDES", "SPECREQUEST", "CULT", "REPTSTATUS"  Radiology Studies: CT CHEST WO CONTRAST Result Date: 06/22/2023 CLINICAL DATA:  Staging for intra-abdominal/right renal mass with liver metastases. * Tracking Code: BO * EXAM: CT CHEST WITHOUT CONTRAST TECHNIQUE: Multidetector CT imaging of the chest was performed following the standard  protocol without IV contrast. RADIATION DOSE REDUCTION: This exam was performed according to the departmental dose-optimization program which includes automated exposure control, adjustment of the mA and/or kV according to patient size and/or use of iterative reconstruction technique. COMPARISON:  CT abdomen pelvis 06/19/2023. FINDINGS: Cardiovascular: Atherosclerotic calcification of the aorta, aortic valve and coronary arteries. Heart size normal. No pericardial effusion. Mediastinum/Nodes: Low-attenuation lesions in the thyroid  measure up to 1.4 cm. No follow-up recommended. (Ref: J Am Coll Radiol. 2015 Feb;12(2): 143-50).No pathologically enlarged mediastinal or axillary lymph nodes. Hilar regions are difficult to definitively evaluate without IV contrast. Air in the esophagus can be seen with dysmotility. Lungs/Pleura: Minimal dependent atelectasis. No suspicious pulmonary nodules. Trace right pleural fluid. Airway is unremarkable. Upper Abdomen: Heterogeneous liver masses and right renal mass, better seen on yesterday's CT abdomen with contrast. Visualized portions of the liver, adrenal glands, kidneys, spleen, pancreas, stomach and bowel are otherwise grossly unremarkable. No upper abdominal adenopathy. Musculoskeletal: Degenerative changes in the spine.  Osteopenia. IMPRESSION: 1. No evidence of metastatic disease in the chest. 2. Heterogeneous liver masses and right renal mass, better evaluated on CT abdomen performed yesterday. 3. Trace right pleural  fluid. 4. Aortic atherosclerosis (ICD10-I70.0). Coronary artery calcification. Electronically Signed   By: Shearon Denis M.D.   On: 06/22/2023 10:24   ECHOCARDIOGRAM COMPLETE Result Date: 06/21/2023    ECHOCARDIOGRAM REPORT   Patient Name:   Hannah Madden Payano Date of Exam: 06/21/2023 Medical Rec #:  [id number]        Height:       65.0 in Accession #:    [id number]       Weight:       117.0 lb Date of Birth:  Mar 11, 1934       BSA:          1.575 m Patient Age:    88 years         BP:           130/65 mmHg Patient Gender: F                HR:           85 bpm. Exam Location:  Inpatient Procedure: 2D Echo, Cardiac Doppler and Color Doppler (Both Spectral and Color            Flow Doppler were utilized during procedure). Indications:    Syncope  History:        Patient has no prior history of Echocardiogram examinations.  Sonographer:    Willey Harrier Referring Phys: [id number] DAVID MANUEL ORTIZ  Sonographer Comments: Technically difficult study due to poor echo windows. Image acquisition challenging due to patient body habitus. IMPRESSIONS  1. Left ventricular ejection fraction, by estimation, is 60 to 65%. The left ventricle has normal function. The left ventricle has no regional wall motion abnormalities. Left ventricular diastolic parameters are indeterminate.  2. Right ventricular systolic function is normal. The right ventricular size is normal.  3. The mitral valve is normal in structure. No evidence of mitral valve regurgitation. No evidence of mitral stenosis.  4. The aortic valve is normal in structure. Aortic valve regurgitation is mild to moderate. Aortic valve sclerosis/calcification is present, without any evidence of aortic stenosis.  5. The inferior vena cava is normal in size with greater than 50% respiratory variability, suggesting right atrial pressure of 3 mmHg. FINDINGS  Left Ventricle: Left ventricular ejection fraction, by estimation, is 60 to 65%. The left ventricle has normal function. The  left  ventricle has no regional wall motion abnormalities. The left ventricular internal cavity size was normal in size. There is  no left ventricular hypertrophy. Left ventricular diastolic parameters are indeterminate. Right Ventricle: The right ventricular size is normal. No increase in right ventricular wall thickness. Right ventricular systolic function is normal. Left Atrium: Left atrial size was normal in size. Right Atrium: Right atrial size was normal in size. Pericardium: Trivial pericardial effusion is present. The pericardial effusion is circumferential and anterior to the right ventricle. Mitral Valve: The mitral valve is normal in structure. No evidence of mitral valve regurgitation. No evidence of mitral valve stenosis. MV peak gradient, 3.8 mmHg. The mean mitral valve gradient is 1.0 mmHg. Tricuspid Valve: The tricuspid valve is normal in structure. Tricuspid valve regurgitation is trivial. No evidence of tricuspid stenosis. Aortic Valve: The aortic valve is normal in structure. Aortic valve regurgitation is mild to moderate. Aortic regurgitation PHT measures 543 msec. Aortic valve sclerosis/calcification is present, without any evidence of aortic stenosis. Aortic valve peak  gradient measures 3.9 mmHg. Pulmonic Valve: The pulmonic valve was normal in structure. Pulmonic valve regurgitation is not visualized. No evidence of pulmonic stenosis. Aorta: The aortic root is normal in size and structure. Venous: The inferior vena cava is normal in size with greater than 50% respiratory variability, suggesting right atrial pressure of 3 mmHg. IAS/Shunts: No atrial level shunt detected by color flow Doppler.  LEFT VENTRICLE PLAX 2D LVIDd:         4.30 cm   Diastology LVIDs:         3.00 cm   LV e' medial:  8.05 cm/s LV PW:         0.70 cm   LV e' lateral: 11.10 cm/s LV IVS:        0.70 cm LVOT diam:     2.00 cm LV SV:         56 LV SV Index:   36 LVOT Area:     3.14 cm  RIGHT VENTRICLE             IVC RV  Basal diam:  3.00 cm     IVC diam: 1.20 cm RV S prime:     11.60 cm/s TAPSE (M-mode): 2.0 cm LEFT ATRIUM           Index        RIGHT ATRIUM          Index LA Vol (A2C): 19.9 ml 12.63 ml/m  RA Area:     7.01 cm LA Vol (A4C): 16.8 ml 10.67 ml/m  RA Volume:   11.60 ml 7.36 ml/m  AORTIC VALVE AV Area (Vmax): 2.59 cm AV Vmax:        98.90 cm/s AV Peak Grad:   3.9 mmHg LVOT Vmax:      81.60 cm/s LVOT Vmean:     61.100 cm/s LVOT VTI:       0.178 m AI PHT:         543 msec  AORTA Ao Root diam: 3.20 cm Ao Asc diam:  2.70 cm MITRAL VALVE             TRICUSPID VALVE MV Area VTI:  2.85 cm   TR Peak grad:   25.8 mmHg MV Peak grad: 3.8 mmHg   TR Vmax:        254.00 cm/s MV Mean grad: 1.0 mmHg MV Vmax:      0.97 m/s   SHUNTS MV Vmean:  56.7 cm/s  Systemic VTI:  0.18 m                          Systemic Diam: 2.00 cm Kardie Tobb DO Electronically signed by Jerryl Morin DO Signature Date/Time: 06/21/2023/11:05:47 AM    Final    VAS US  CAROTID Result Date: 06/21/2023 Carotid Arterial Duplex Study Patient Name:  LANIAH GRIMM Lien  Date of Exam:   06/20/2023 Medical Rec #: [id number]         Accession #:    [id number] Date of Birth: [birth year]        Patient Gender: F Patient Age:   33 years Exam Location:  [facility] Procedure:      VAS US  CAROTID Referring Phys: DAVID ORTIZ --------------------------------------------------------------------------------  Indications:       Syncope. Risk Factors:      Hyperlipidemia. Comparison Study:  No prior exam on file. Performing Technologist: Ria Chad  Examination Guidelines: A complete evaluation includes B-mode imaging, spectral Doppler, color Doppler, and power Doppler as needed of all accessible portions of each vessel. Bilateral testing is considered an integral part of a complete examination. Limited examinations for reoccurring indications may be performed as noted.  Right Carotid Findings:  +----------+--------+--------+--------+-------------------------+--------+           PSV cm/sEDV cm/sStenosisPlaque Description       Comments +----------+--------+--------+--------+-------------------------+--------+ CCA Prox  122     15                                                +----------+--------+--------+--------+-------------------------+--------+ CCA Distal63      12                                                +----------+--------+--------+--------+-------------------------+--------+ ICA Prox  60      13              heterogenous and calcific         +----------+--------+--------+--------+-------------------------+--------+ ICA Mid   93      23                                                +----------+--------+--------+--------+-------------------------+--------+ ICA Distal150     26                                       tortuous +----------+--------+--------+--------+-------------------------+--------+ ECA       80      12                                                +----------+--------+--------+--------+-------------------------+--------+ +----------+--------+-------+--------+-------------------+           PSV cm/sEDV cmsDescribeArm Pressure (mmHG) +----------+--------+-------+--------+-------------------+ [id number]                                        +----------+--------+-------+--------+-------------------+ +---------+--------+--+--------+--+  VertebralPSV cm/s60EDV cm/s10 +---------+--------+--+--------+--+ Slightly elevated velocities due to tortuosity. Left Carotid Findings: +----------+--------+--------+--------+------------------+--------+           PSV cm/sEDV cm/sStenosisPlaque DescriptionComments +----------+--------+--------+--------+------------------+--------+ CCA Prox  136     17                                         +----------+--------+--------+--------+------------------+--------+ CCA  Distal78      12                                         +----------+--------+--------+--------+------------------+--------+ ICA Prox  81      12                                         +----------+--------+--------+--------+------------------+--------+ ICA Mid   78      15                                         +----------+--------+--------+--------+------------------+--------+ ICA Distal123     24                                tortuous +----------+--------+--------+--------+------------------+--------+ ECA       76      16                                         +----------+--------+--------+--------+------------------+--------+ +----------+--------+--------+--------+-------------------+           PSV cm/sEDV cm/sDescribeArm Pressure (mmHG) +----------+--------+--------+--------+-------------------+ Subclavian122                                         +----------+--------+--------+--------+-------------------+ +---------+--------+--+--------+--+ VertebralPSV cm/s72EDV cm/s14 +---------+--------+--+--------+--+ Slightly elevated velocities due to tortuosity.  Summary: Right Carotid: Velocities in the right ICA are consistent with a 1-39% stenosis. Left Carotid: Velocities in the left ICA are consistent with a 1-39% stenosis.               Hemodynamically significant plaque >50% visualized in the CCA. Vertebrals:  Bilateral vertebral arteries demonstrate antegrade flow. Subclavians: Normal flow hemodynamics were seen in bilateral subclavian              arteries. *See table(s) above for measurements and observations.  Electronically signed by Ardella Beaver MD on 06/21/2023 at 10:50:00 AM.    Final     LOS: 2 days    Total time spent in review of labs and imaging, patient evaluation, formulation of plan, documentation and communication with patient/family: 35 minutes  Lesa Rape, MD [facility] 06/22/2023, 2:07 PM

## 2023-06-22 NOTE — Progress Notes (Signed)
   06/22/23 1355  TOC Brief Assessment  Insurance and Status Reviewed  Patient has primary care physician Yes  Home environment has been reviewed Single family home  Prior level of function: modified independent  Prior/Current Home Services No current home services  Social Drivers of Health Review SDOH reviewed no interventions necessary  Readmission risk has been reviewed Yes  Transition of care needs transition of care needs identified, TOC will continue to follow

## 2023-06-22 NOTE — Procedures (Signed)
 Vascular and Interventional Radiology Procedure Note  Patient: SADIA BELFIORE DOB: 02-28-1935 Medical Record Number: [id number] Note Date/Time: 06/22/23 4:11 PM   Performing Physician: Art Largo, MD Assistant(s): None  Diagnosis: Liver masses. No Dx   Procedure: LIVER MASS BIOPSY  Anesthesia: Conscious Sedation Complications: None Estimated Blood Loss: Minimal Specimens: Sent for Pathology  Findings:  Successful Ultrasound-guided biopsy of liver mass. A total of 3 samples were obtained. Hemostasis of the tract was achieved using Manual Pressure.  Plan: Bed rest for 2 hours.  See detailed procedure note with images in PACS. The patient tolerated the procedure well without incident or complication and was returned to Recovery in stable condition.    Art Largo, MD [facility]   Pager. [phone] Clinic. [phone]

## 2023-06-22 NOTE — Progress Notes (Signed)
   06/22/23 0958  Spiritual Encounters  Type of Visit Initial  Care provided to: Family  Referral source Physician   Per consult request from Dr. Leann Prom who shared patient's request for prayer, I offered spiritual care support.  This morning, Mrs. Gaw was sleeping. This created a good opportunity to engage her daughter who debriefed with me progression of diagnosis and life stressors.  I provided compassionate presence and active and reflective listening. I invited naming processing of grief (anticipatory, other) and acknowledged impact of stressors, offering normalization of emotion and encouraging self-care.  Will return to engage Mrs. Broner and offer time for prayer and to assess other needs she may have. Ongoing support for patient and family remain available as needed. Jocelin Schuelke L. Minetta Aly, M.Div [phone]

## 2023-06-22 NOTE — Hospital Course (Addendum)
 89-year-old woman otherwise healthy with only GERD was evaluated for back pain and malaise.  Workup reveals 3.5 cm renal mass, 4 cm mesenteric mass and multiple liver nodules and also noted to have a UTI. While in the ED patient had presyncope/syncope requiring rapid response Workup revealed renal mass, also mesenteric mass versus lymph node and multiple hepatic nodules. Underwent ultrasound-guided liver biopsy 06/22/23. Post-biopsy doing well.  Discussed with oncology Edwina Gram and Cam w/ urology and they will plan to follow-up biopsy result and patient is being discharged home  Subjective: Seen this am Overnight afebrile BP stable labs ok. Daughter at bedside  Discharge diagnosis:  3.5 cm renal mass 4 cm mesenteric mass versus lymph node Multiple hepatic nodules: Radio on board and per radio RCC does not usually metastasize to liver- and there may be a shadow in bladder suggestive of bladder CA, bladder CA does go to the liver. see seen by uro Dr MacDiadmid-do not feel the renal mass is likely renal cell carcinoma. 4/21 S/p  ultrasound-guided liver biopsy.Per urology she may have a lesion in the bladder and depending on the liver biopsy result I will recommend cystoscopy which will be outpatient.   UTI Completed antibiotics course here.  Asymptomatic now   Syncope vs presyncope In ED: Hydrated.  Monitoring on telemetry.  Echo with normal EF 60-50% no RWMA no significant valve disease. Carotid duplex right left ICA 1-39% stenosis, hemodynamically significant plaque >50% in CCA- discussed w/ Clark and no need for intervention for incidental lesion and he will plan for Op f/u.  Hemodynamically significant plaque in the left CCA; See above.  GERD Continue PPI   Glaucoma Continue Combigan   Chronic anemia: Hemoglobin holding at 9 g range

## 2023-06-23 ENCOUNTER — Other Ambulatory Visit (HOSPITAL_COMMUNITY): Payer: Self-pay

## 2023-06-23 ENCOUNTER — Other Ambulatory Visit: Payer: Self-pay

## 2023-06-23 DIAGNOSIS — N3000 Acute cystitis without hematuria: Secondary | ICD-10-CM | POA: Diagnosis not present

## 2023-06-23 LAB — COMPREHENSIVE METABOLIC PANEL WITH GFR
ALT: 11 U/L (ref 0–44)
AST: 21 U/L (ref 15–41)
Albumin: 3.6 g/dL (ref 3.5–5.0)
Alkaline Phosphatase: 55 U/L (ref 38–126)
Anion gap: 12 (ref 5–15)
BUN: 18 mg/dL (ref 8–23)
CO2: 20 mmol/L — ABNORMAL LOW (ref 22–32)
Calcium: 9.2 mg/dL (ref 8.9–10.3)
Chloride: 105 mmol/L (ref 98–111)
Creatinine, Ser: 0.71 mg/dL (ref 0.44–1.00)
GFR, Estimated: >60 mL/min (ref >60)
Glucose, Bld: 87 mg/dL (ref 70–99)
Potassium: 4.2 mmol/L (ref 3.5–5.1)
Sodium: 137 mmol/L (ref 135–145)
Total Bilirubin: 0.5 mg/dL (ref 0.0–1.2)
Total Protein: 6.8 g/dL (ref 6.5–8.1)

## 2023-06-23 LAB — CBC
HCT: 35.1 % — ABNORMAL LOW (ref 36.0–46.0)
Hemoglobin: 10.7 g/dL — ABNORMAL LOW (ref 12.0–15.0)
MCH: 28.8 pg (ref 26.0–34.0)
MCHC: 30.5 g/dL (ref 30.0–36.0)
MCV: 94.6 fL (ref 80.0–100.0)
Platelets: 200 10*3/uL (ref 150–400)
RBC: 3.71 MIL/uL — ABNORMAL LOW (ref 3.87–5.11)
RDW: 13.4 % (ref 11.5–15.5)
WBC: 9.2 10*3/uL (ref 4.0–10.5)
nRBC: 0 % (ref 0.0–0.2)

## 2023-06-23 LAB — GLUCOSE, CAPILLARY: Glucose-Capillary: 90 mg/dL (ref 70–99)

## 2023-06-23 MED ORDER — BISACODYL 10 MG RE SUPP: 10.0000 mg | Freq: Every day | RECTAL | Status: DC | PRN

## 2023-06-23 MED ORDER — DOCUSATE SODIUM 100 MG PO CAPS: 100.0000 mg | ORAL_CAPSULE | Freq: Two times a day (BID) | ORAL | Status: DC

## 2023-06-23 MED ORDER — BISACODYL 10 MG RE SUPP
10.0000 mg | Freq: Every day | RECTAL | 0 refills | Status: DC | PRN
Start: 2023-06-23 — End: 2023-06-30
  Filled 2023-06-23: qty 12, 12d supply, fill #0

## 2023-06-23 MED ORDER — DOCUSATE SODIUM 100 MG PO CAPS
100.0000 mg | ORAL_CAPSULE | Freq: Two times a day (BID) | ORAL | 0 refills | Status: DC
  Filled 2023-06-23: qty 10, 5d supply, fill #0

## 2023-06-23 NOTE — Plan of Care (Signed)
 Biopsy collected yesterday. Urology will follow along peripherally and discuss path results as they come available.

## 2023-06-23 NOTE — Progress Notes (Signed)
 Discharge instructions given to patient and family Questions ask and answered D Rosevelt Constable RN

## 2023-06-23 NOTE — Discharge Summary (Signed)
 Physician Discharge Summary  Hannah Madden [facility] NWG:[id number] DOB: 10/07/1934 DOA: 06/19/2023  PCP: Roslyn Coombe, MD  Admit date: 06/19/2023 Discharge date: 06/23/2023 Recommendations for Outpatient Follow-up:  Follow up with PCP in 1 weeks-call for appointment [facility]-up with [facility] for, oncology regarding biopsy results and further management workup Please obtain BMP/CBC in one week  Discharge Dispo: Home Discharge Condition: Stable Code Status:   Code Status: Full Code Diet recommendation:  Diet Order             Diet regular Fluid consistency: Thin  Diet effective now                    Brief/Interim Summary: 89-year-old woman otherwise healthy with only GERD was evaluated for back pain and malaise.  Workup reveals 3.5 cm renal mass, 4 cm mesenteric mass and multiple liver nodules and also noted to have a UTI. While in the ED patient had presyncope/syncope requiring rapid response Workup revealed renal mass, also mesenteric mass versus lymph node and multiple hepatic nodules. Underwent ultrasound-guided liver biopsy 06/22/23. Post-biopsy doing well.  Discussed with oncology Edwina Gram and Cam w/ urology and they will plan to follow-up biopsy result and patient is being discharged home  Subjective: Seen this am Overnight afebrile BP stable labs ok. Daughter at bedside  Discharge diagnosis:  3.5 cm renal mass 4 cm mesenteric mass versus lymph node Multiple hepatic nodules: Radio on board and per radio RCC does not usually metastasize to liver- and there may be a shadow in bladder suggestive of bladder CA, bladder CA does go to the liver. see seen by uro Dr MacDiadmid-do not feel the renal mass is likely renal cell carcinoma. 4/21 S/p  ultrasound-guided liver biopsy.Per urology she may have a lesion in the bladder and depending on the liver biopsy result I will recommend cystoscopy which will be outpatient.   UTI Completed antibiotics course here.  Asymptomatic now    Syncope vs presyncope In ED: Hydrated.  Monitoring on telemetry.  Echo with normal EF 60-50% no RWMA no significant valve disease. Carotid duplex right left ICA 1-39% stenosis, hemodynamically significant plaque >50% in CCA- discussed w/ Clark and no need for intervention for incidental lesion and he will plan for Op f/u.  Hemodynamically significant plaque in the left CCA; See above.  GERD Continue PPI   Glaucoma Continue Combigan   Chronic anemia: Hemoglobin holding at 9 g range   Discharge Exam: Vitals:   06/23/23 0407 06/23/23 1213  BP: (!) 148/70 (!) 145/67  Pulse: 73 72  Resp: 15   Temp: 98 F (36.7 C) 98.4 F (36.9 C)  SpO2: 100% 99%   General: Pt is alert, awake, not in acute distress Cardiovascular: RRR, S1/S2 +, no rubs, no gallops Respiratory: CTA bilaterally, no wheezing, no rhonchi Abdominal: Soft, NT, ND, bowel sounds + Extremities: no edema, no cyanosis  Discharge Instructions  Discharge Instructions     Ambulatory referral to Hematology / Oncology   Complete by: As directed    Discharge instructions   Complete by: As directed    Please call call MD or return to ER for similar or worsening recurring problem that brought you to hospital or if any fever,nausea/vomiting,abdominal pain, uncontrolled pain, chest pain,  shortness of breath or any other alarming symptoms.  Please follow-up your doctor as instructed in a week time and call the office for appointment.  Please avoid alcohol , smoking, or any other illicit substance and maintain healthy habits including taking your regular  medications as prescribed.  You were cared for by a hospitalist during your hospital stay. If you have any questions about your discharge medications or the care you received while you were in the hospital after you are discharged, you can call the unit and ask to speak with the hospitalist on call if the hospitalist that took care of you is not available.  Once you are  discharged, your primary care physician will handle any further medical issues. Please note that NO REFILLS for any discharge medications will be authorized once you are discharged, as it is imperative that you return to your primary care physician (or establish a relationship with a primary care physician if you do not have one) for your aftercare needs so that they can reassess your need for medications and monitor your lab values   No wound care   Complete by: As directed       Allergies as of 06/23/2023       Reactions   Atorvastatin    REACTION: INTOL to Lipitor w/ leg pain   Macrobid [nitrofurantoin] Other (See Comments)   diarrhea   Penicillins    REACTION: hives   Ciprofloxacin  Rash        Medication List     TAKE these medications    bisacodyl  10 MG suppository Commonly known as: DULCOLAX Place 1 suppository (10 mg total) rectally daily as needed for moderate constipation.   brimonidine -timolol  0.2-0.5 % ophthalmic solution Commonly known as: COMBIGAN  Place 1 drop into the right eye 2 (two) times daily.   brinzolamide  1 % ophthalmic suspension Commonly known as: AZOPT  Place 1 drop into the right eye 2 (two) times daily.   docusate sodium  100 MG capsule Commonly known as: COLACE Take 1 capsule (100 mg total) by mouth 2 (two) times daily.   ondansetron  4 MG tablet Commonly known as: ZOFRAN  Take 1 tablet (4 mg total) by mouth every 8 (eight) hours as needed for nausea or vomiting.   pantoprazole  40 MG tablet Commonly known as: PROTONIX  Take 1 tablet (40 mg total) by mouth daily.   tobramycin 0.3 % ophthalmic solution Commonly known as: TOBREX Place 1 drop into the right eye as needed (before eye injection).        Follow-up Information     [facility] Follow up in 1 week(s).   Contact information: [address] [place] [address] [place]  [address] [phone]        Roslyn Coombe, MD Follow up in 1 week(s).   Specialties:  Internal Medicine, Radiology Contact information: [address] [place] [address] [phone]                Allergies  Allergen Reactions   Atorvastatin     REACTION: INTOL to Lipitor w/ leg pain   Macrobid [Nitrofurantoin] Other (See Comments)    diarrhea   Penicillins     REACTION: hives   Ciprofloxacin  Rash    The results of significant diagnostics from this hospitalization (including imaging, microbiology, ancillary and laboratory) are listed below for reference.    Microbiology: No results found for this or any previous visit (from the past 240 hours).  Procedures/Studies: US  BIOPSY (LIVER) Result Date: 06/22/2023 INDICATION: [id number] Intraabdominal mass [id number].  Liver masses.  No diagnosis. EXAM: ULTRASOUND GUIDED LIVER MASS BIOPSY COMPARISON:  CT AP, 06/19/2023 MEDICATIONS: None ANESTHESIA/SEDATION: Moderate (conscious) sedation was employed during this procedure. A total of Versed  2 mg and Fentanyl  100 mcg was administered  intravenously. Moderate Sedation Time: 15 minutes. The patient's level of consciousness and vital signs were monitored continuously by radiology nursing throughout the procedure under my direct supervision. COMPLICATIONS: None immediate. PROCEDURE: Informed written consent was obtained from the patient and/or patient's representative after a discussion of the risks, benefits and alternatives to treatment. The patient understands and consents the procedure. A timeout was performed prior to the initiation of the procedure. Ultrasound scanning was performed of the right upper abdominal quadrant demonstrates multifocal liver masses, largest within the RIGHT upper lobe measuring up to 10 cm A LEFT hepatic lobe mass was selected for biopsy and the procedure was planned. The right upper abdominal quadrant was prepped and draped in the usual sterile fashion. The overlying soft tissues were anesthetized with 1% lidocaine . A 17 gauge co-axial needle was  advanced into a peripheral aspect of the lesion. This was followed by 4 core biopsies with an 18 gauge core device under direct ultrasound guidance. The needle was removed, then superficial hemostasis was obtained with manual compression. Post procedural scanning was negative for definitive area of hemorrhage or additional complication. A dressing was placed. The patient tolerated the procedure well without immediate post procedural complication. IMPRESSION: Successful ultrasound-guided core needle biopsy of a liver mass. Art Largo, MD [facility] Electronically Signed   By: Art Largo M.D.   On: 06/22/2023 16:56   CT CHEST WO CONTRAST Result Date: 06/22/2023 CLINICAL DATA:  Staging for intra-abdominal/right renal mass with liver metastases. * Tracking Code: BO * EXAM: CT CHEST WITHOUT CONTRAST TECHNIQUE: Multidetector CT imaging of the chest was performed following the standard protocol without IV contrast. RADIATION DOSE REDUCTION: This exam was performed according to the departmental dose-optimization program which includes automated exposure control, adjustment of the mA and/or kV according to patient size and/or use of iterative reconstruction technique. COMPARISON:  CT abdomen pelvis 06/19/2023. FINDINGS: Cardiovascular: Atherosclerotic calcification of the aorta, aortic valve and coronary arteries. Heart size normal. No pericardial effusion. Mediastinum/Nodes: Low-attenuation lesions in the thyroid  measure up to 1.4 cm. No follow-up recommended. (Ref: J Am Coll Radiol. 2015 Feb;12(2): 143-50).No pathologically enlarged mediastinal or axillary lymph nodes. Hilar regions are difficult to definitively evaluate without IV contrast. Air in the esophagus can be seen with dysmotility. Lungs/Pleura: Minimal dependent atelectasis. No suspicious pulmonary nodules. Trace right pleural fluid. Airway is unremarkable. Upper Abdomen: Heterogeneous liver  masses and right renal mass, better seen on yesterday's CT abdomen with contrast. Visualized portions of the liver, adrenal glands, kidneys, spleen, pancreas, stomach and bowel are otherwise grossly unremarkable. No upper abdominal adenopathy. Musculoskeletal: Degenerative changes in the spine.  Osteopenia. IMPRESSION: 1. No evidence of metastatic disease in the chest. 2. Heterogeneous liver masses and right renal mass, better evaluated on CT abdomen performed yesterday. 3. Trace right pleural fluid. 4. Aortic atherosclerosis (ICD10-I70.0). Coronary artery calcification. Electronically Signed   By: Shearon Denis M.D.   On: 06/22/2023 10:24   ECHOCARDIOGRAM COMPLETE Result Date: 06/21/2023    ECHOCARDIOGRAM REPORT   Patient Name:   Hannah Madden Date of Exam: 06/21/2023 Medical Rec #:  [id number]        Height:       65.0 in Accession #:    [id number]       Weight:       117.0 lb Date of Birth:  1934-11-14       BSA:          1.575 m Patient Age:    85  years         BP:           130/65 mmHg Patient Gender: F                HR:           85 bpm. Exam Location:  Inpatient Procedure: 2D Echo, Cardiac Doppler and Color Doppler (Both Spectral and Color            Flow Doppler were utilized during procedure). Indications:    Syncope  History:        Patient has no prior history of Echocardiogram examinations.  Sonographer:    Willey Harrier Referring Phys: [id number] DAVID MANUEL ORTIZ  Sonographer Comments: Technically difficult study due to poor echo windows. Image acquisition challenging due to patient body habitus. IMPRESSIONS  1. Left ventricular ejection fraction, by estimation, is 60 to 65%. The left ventricle has normal function. The left ventricle has no regional wall motion abnormalities. Left ventricular diastolic parameters are indeterminate.  2. Right ventricular systolic function is normal. The right ventricular size is normal.  3. The mitral valve is normal in structure. No evidence of mitral valve  regurgitation. No evidence of mitral stenosis.  4. The aortic valve is normal in structure. Aortic valve regurgitation is mild to moderate. Aortic valve sclerosis/calcification is present, without any evidence of aortic stenosis.  5. The inferior vena cava is normal in size with greater than 50% respiratory variability, suggesting right atrial pressure of 3 mmHg. FINDINGS  Left Ventricle: Left ventricular ejection fraction, by estimation, is 60 to 65%. The left ventricle has normal function. The left ventricle has no regional wall motion abnormalities. The left ventricular internal cavity size was normal in size. There is  no left ventricular hypertrophy. Left ventricular diastolic parameters are indeterminate. Right Ventricle: The right ventricular size is normal. No increase in right ventricular wall thickness. Right ventricular systolic function is normal. Left Atrium: Left atrial size was normal in size. Right Atrium: Right atrial size was normal in size. Pericardium: Trivial pericardial effusion is present. The pericardial effusion is circumferential and anterior to the right ventricle. Mitral Valve: The mitral valve is normal in structure. No evidence of mitral valve regurgitation. No evidence of mitral valve stenosis. MV peak gradient, 3.8 mmHg. The mean mitral valve gradient is 1.0 mmHg. Tricuspid Valve: The tricuspid valve is normal in structure. Tricuspid valve regurgitation is trivial. No evidence of tricuspid stenosis. Aortic Valve: The aortic valve is normal in structure. Aortic valve regurgitation is mild to moderate. Aortic regurgitation PHT measures 543 msec. Aortic valve sclerosis/calcification is present, without any evidence of aortic stenosis. Aortic valve peak  gradient measures 3.9 mmHg. Pulmonic Valve: The pulmonic valve was normal in structure. Pulmonic valve regurgitation is not visualized. No evidence of pulmonic stenosis. Aorta: The aortic root is normal in size and structure. Venous: The  inferior vena cava is normal in size with greater than 50% respiratory variability, suggesting right atrial pressure of 3 mmHg. IAS/Shunts: No atrial level shunt detected by color flow Doppler.  LEFT VENTRICLE PLAX 2D LVIDd:         4.30 cm   Diastology LVIDs:         3.00 cm   LV e' medial:  8.05 cm/s LV PW:         0.70 cm   LV e' lateral: 11.10 cm/s LV IVS:        0.70 cm LVOT diam:     2.00 cm  LV SV:         56 LV SV Index:   36 LVOT Area:     3.14 cm  RIGHT VENTRICLE             IVC RV Basal diam:  3.00 cm     IVC diam: 1.20 cm RV S prime:     11.60 cm/s TAPSE (M-mode): 2.0 cm LEFT ATRIUM           Index        RIGHT ATRIUM          Index LA Vol (A2C): 19.9 ml 12.63 ml/m  RA Area:     7.01 cm LA Vol (A4C): 16.8 ml 10.67 ml/m  RA Volume:   11.60 ml 7.36 ml/m  AORTIC VALVE AV Area (Vmax): 2.59 cm AV Vmax:        98.90 cm/s AV Peak Grad:   3.9 mmHg LVOT Vmax:      81.60 cm/s LVOT Vmean:     61.100 cm/s LVOT VTI:       0.178 m AI PHT:         543 msec  AORTA Ao Root diam: 3.20 cm Ao Asc diam:  2.70 cm MITRAL VALVE             TRICUSPID VALVE MV Area VTI:  2.85 cm   TR Peak grad:   25.8 mmHg MV Peak grad: 3.8 mmHg   TR Vmax:        254.00 cm/s MV Mean grad: 1.0 mmHg MV Vmax:      0.97 m/s   SHUNTS MV Vmean:     56.7 cm/s  Systemic VTI:  0.18 m                          Systemic Diam: 2.00 cm Kardie Tobb DO Electronically signed by Jerryl Morin DO Signature Date/Time: 06/21/2023/11:05:47 AM    Final    VAS US  CAROTID Result Date: 06/21/2023 Carotid Arterial Duplex Study Patient Name:  Hannah Madden  Date of Exam:   06/20/2023 Medical Rec #: [id number]         Accession #:    [id number] Date of Birth: [birth year]        Patient Gender: F Patient Age:   89 years Exam Location:  [facility] Procedure:      VAS US  CAROTID Referring Phys: DAVID ORTIZ --------------------------------------------------------------------------------  Indications:       Syncope. Risk Factors:      Hyperlipidemia. Comparison  Study:  No prior exam on file. Performing Technologist: Ria Chad  Examination Guidelines: A complete evaluation includes B-mode imaging, spectral Doppler, color Doppler, and power Doppler as needed of all accessible portions of each vessel. Bilateral testing is considered an integral part of a complete examination. Limited examinations for reoccurring indications may be performed as noted.  Right Carotid Findings: +----------+--------+--------+--------+-------------------------+--------+           PSV cm/sEDV cm/sStenosisPlaque Description       Comments +----------+--------+--------+--------+-------------------------+--------+ CCA Prox  122     15                                                +----------+--------+--------+--------+-------------------------+--------+ CCA Distal63      12                                                +----------+--------+--------+--------+-------------------------+--------+  ICA Prox  60      13              heterogenous and calcific         +----------+--------+--------+--------+-------------------------+--------+ ICA Mid   93      23                                                +----------+--------+--------+--------+-------------------------+--------+ ICA Distal150     26                                       tortuous +----------+--------+--------+--------+-------------------------+--------+ ECA       80      12                                                +----------+--------+--------+--------+-------------------------+--------+ +----------+--------+-------+--------+-------------------+           PSV cm/sEDV cmsDescribeArm Pressure (mmHG) +----------+--------+-------+--------+-------------------+ [id number]                                        +----------+--------+-------+--------+-------------------+ +---------+--------+--+--------+--+ VertebralPSV cm/s60EDV cm/s10  +---------+--------+--+--------+--+ Slightly elevated velocities due to tortuosity. Left Carotid Findings: +----------+--------+--------+--------+------------------+--------+           PSV cm/sEDV cm/sStenosisPlaque DescriptionComments +----------+--------+--------+--------+------------------+--------+ CCA Prox  136     17                                         +----------+--------+--------+--------+------------------+--------+ CCA Distal78      12                                         +----------+--------+--------+--------+------------------+--------+ ICA Prox  81      12                                         +----------+--------+--------+--------+------------------+--------+ ICA Mid   78      15                                         +----------+--------+--------+--------+------------------+--------+ ICA Distal123     24                                tortuous +----------+--------+--------+--------+------------------+--------+ ECA       76      16                                         +----------+--------+--------+--------+------------------+--------+ +----------+--------+--------+--------+-------------------+  PSV cm/sEDV cm/sDescribeArm Pressure (mmHG) +----------+--------+--------+--------+-------------------+ Subclavian122                                         +----------+--------+--------+--------+-------------------+ +---------+--------+--+--------+--+ VertebralPSV cm/s72EDV cm/s14 +---------+--------+--+--------+--+ Slightly elevated velocities due to tortuosity.  Summary: Right Carotid: Velocities in the right ICA are consistent with a 1-39% stenosis. Left Carotid: Velocities in the left ICA are consistent with a 1-39% stenosis.               Hemodynamically significant plaque >50% visualized in the CCA. Vertebrals:  Bilateral vertebral arteries demonstrate antegrade flow. Subclavians: Normal flow hemodynamics were seen  in bilateral subclavian              arteries. *See table(s) above for measurements and observations.  Electronically signed by Ardella Beaver MD on 06/21/2023 at 10:50:00 AM.    Final    CT ABDOMEN PELVIS W CONTRAST Result Date: 06/20/2023 CLINICAL DATA:  Acute nonlocalized abdominal pain EXAM: CT ABDOMEN AND PELVIS WITH CONTRAST TECHNIQUE: Multidetector CT imaging of the abdomen and pelvis was performed using the standard protocol following bolus administration of intravenous contrast. RADIATION DOSE REDUCTION: This exam was performed according to the departmental dose-optimization program which includes automated exposure control, adjustment of the mA and/or kV according to patient size and/or use of iterative reconstruction technique. CONTRAST:  OMNIPAQUE  IOHEXOL  300 MG/ML  SOLN COMPARISON:  None Available. FINDINGS: Lower chest: No acute abnormality. Hepatobiliary: Multiple peripherally enhancing centrally hypoattenuating heterogenous lesions throughout the liver. These measure up to 10.6 cm in the right hepatic lobe and 5.7 cm in the left hepatic lobe. Unremarkable gallbladder and biliary tree. Pancreas: Unremarkable. Spleen: Unremarkable. Adrenals/Urinary Tract: Normal adrenal glands. Unremarkable left kidney. Heterogenous enhancing mass in the superior pole of the right kidney measuring 3.5 cm (series 5/image 22). No urinary calculi or hydronephrosis. Distended bladder. Stomach/Bowel: Stomach is within normal limits. No bowel wall thickening or bowel obstruction. Vascular/Lymphatic: Aortic atherosclerotic calcification. Mesenteric mass or lymphadenopathy in the left central mesentery measuring 3.9 cm (series 3/image 33). Reproductive: Hysterectomy.  No adnexal mass. Other: No free intraperitoneal fluid or air. Musculoskeletal: No acute fracture or destructive osseous lesion. IMPRESSION: 1. Heterogenous enhancing mass in the superior pole of the right kidney measuring 3.5 cm, concerning for renal cell  carcinoma. 2. Multiple large hepatic metastases. 3. Mesenteric mass or lymphadenopathy in the left central mesentery measuring 3.9 cm. 4. Aortic Atherosclerosis (ICD10-I70.0). Electronically Signed   By: Rozell Cornet M.D.   On: 06/20/2023 01:54    Labs: BNP (last 3 results) No results for input(s): "BNP" in the last 8760 hours. Basic Metabolic Panel: Recent Labs  Lab 06/19/23 2056 06/20/23 1152 06/21/23 0530 06/22/23 0518 06/23/23 0942  NA 133* 136 134* 137 137  K 3.9 4.1 3.9 3.8 4.2  CL 101 103 104 106 105  CO2 22 20* 22 23 20*  GLUCOSE 109* 121* 94 90 87  BUN 18 17 20 17 18   CREATININE 0.81 0.81 0.93 0.78 0.71  CALCIUM  9.2 9.3 8.4* 8.8* 9.2  MG  --  1.7  --   --   --   PHOS  --  4.1  --   --   --    Liver Function Tests: Recent Labs  Lab 06/19/23 2056 06/20/23 1152 06/21/23 0530 06/23/23 0942  AST 24 23 19 21   ALT 9 11 11 11   ALKPHOS 71 62 52  55  BILITOT 0.4 0.9 0.5 0.5  PROT 6.7 6.7 5.8* 6.8  ALBUMIN 4.2 3.7 3.2* 3.6   Recent Labs  Lab 06/19/23 2056  LIPASE 52*   No results for input(s): "AMMONIA" in the last 168 hours. CBC: Recent Labs  Lab 06/19/23 2056 06/20/23 1152 06/21/23 0530 06/22/23 0518 06/23/23 0942  WBC 13.0* 14.2* 11.8* 10.1 9.2  NEUTROABS 8.1*  --   --   --   --   HGB 10.3* 12.7 9.5* 9.7* 10.7*  HCT 31.4* 40.4 30.7* 30.5* 35.1*  MCV 88.7 93.5 93.3 92.1 94.6  PLT 249 240 215 192 200  CBG: Recent Labs  Lab 06/21/23 0447 06/22/23 0520 06/23/23 0540  GLUCAP 89 96 90      Component Value Date/Time   COLORURINE YELLOW 06/19/2023 2056   APPEARANCEUR HAZY (A) 06/19/2023 2056   LABSPEC 1.013 06/19/2023 2056   PHURINE 6.0 06/19/2023 2056   GLUCOSEU NEGATIVE 06/19/2023 2056   GLUCOSEU NEGATIVE 04/17/2023 1440   HGBUR TRACE (A) 06/19/2023 2056   BILIRUBINUR NEGATIVE 06/19/2023 2056   BILIRUBINUR n 09/19/2013 0853   KETONESUR NEGATIVE 06/19/2023 2056   PROTEINUR TRACE (A) 06/19/2023 2056   UROBILINOGEN 0.2 04/17/2023 1440    NITRITE NEGATIVE 06/19/2023 2056   LEUKOCYTESUR LARGE (A) 06/19/2023 2056   Sepsis Labs Recent Labs  Lab 06/20/23 1152 06/21/23 0530 06/22/23 0518 06/23/23 0942  WBC 14.2* 11.8* 10.1 9.2   Microbiology No results found for this or any previous visit (from the past 240 hours).  Time coordinating discharge: 35 minutes  SIGNED: Lesa Rape, MD  [facility] 06/23/2023, 12:46 PM  If 7PM-7AM, please contact night-coverage [email or web address]

## 2023-06-23 NOTE — Plan of Care (Addendum)
 VSS. Patient given PRN Tylenol  for headache with improvement. Patient ambulating to bathroom x1 assist with RW throughout shift. Patient c/o nausea this morning, given PRN Zofran . Daughter remains at bedside. No acute events overnight.  Problem: Education: Goal: Knowledge of General Education information will improve Description: Including pain rating scale, medication(s)/side effects and non-pharmacologic comfort measures Outcome: Progressing   Problem: Health Behavior/Discharge Planning: Goal: Ability to manage health-related needs will improve Outcome: Progressing   Problem: Clinical Measurements: Goal: Ability to maintain clinical measurements within normal limits will improve Outcome: Progressing Goal: Will remain free from infection Outcome: Progressing   Problem: Activity: Goal: Risk for activity intolerance will decrease Outcome: Progressing   Problem: Pain Managment: Goal: General experience of comfort will improve and/or be controlled Outcome: Progressing   Problem: Safety: Goal: Ability to remain free from injury will improve Outcome: Progressing   Problem: Cardiac: Goal: Will achieve and/or maintain adequate cardiac output Outcome: Progressing

## 2023-06-24 ENCOUNTER — Telehealth: Payer: Self-pay

## 2023-06-24 ENCOUNTER — Other Ambulatory Visit: Payer: Self-pay

## 2023-06-24 ENCOUNTER — Encounter: Payer: Self-pay | Admitting: Internal Medicine

## 2023-06-24 ENCOUNTER — Ambulatory Visit: Admitting: Internal Medicine

## 2023-06-24 ENCOUNTER — Encounter: Payer: Self-pay | Admitting: Pharmacist

## 2023-06-24 VITALS — BP 124/80 | HR 95 | Temp 99.6°F | Ht 65.0 in | Wt 110.0 lb

## 2023-06-24 DIAGNOSIS — R300 Dysuria: Secondary | ICD-10-CM | POA: Diagnosis not present

## 2023-06-24 DIAGNOSIS — R1900 Intra-abdominal and pelvic swelling, mass and lump, unspecified site: Secondary | ICD-10-CM

## 2023-06-24 DIAGNOSIS — R739 Hyperglycemia, unspecified: Secondary | ICD-10-CM

## 2023-06-24 LAB — HEPATIC FUNCTION PANEL
ALT: 9 U/L (ref 0–35)
AST: 18 U/L (ref 0–37)
Albumin: 4.3 g/dL (ref 3.5–5.2)
Alkaline Phosphatase: 81 U/L (ref 39–117)
Bilirubin, Direct: 0.1 mg/dL (ref 0.0–0.3)
Total Bilirubin: 0.3 mg/dL (ref 0.2–1.2)
Total Protein: 7.3 g/dL (ref 6.0–8.3)

## 2023-06-24 LAB — CBC WITH DIFFERENTIAL/PLATELET
Basophils Absolute: 0.1 10*3/uL (ref 0.0–0.1)
Basophils Relative: 1.2 % (ref 0.0–3.0)
Eosinophils Absolute: 1.4 10*3/uL — ABNORMAL HIGH (ref 0.0–0.7)
Eosinophils Relative: 15.3 % — ABNORMAL HIGH (ref 0.0–5.0)
HCT: 32.5 % — ABNORMAL LOW (ref 36.0–46.0)
Hemoglobin: 10.9 g/dL — ABNORMAL LOW (ref 12.0–15.0)
Lymphocytes Relative: 26.4 % (ref 12.0–46.0)
Lymphs Abs: 2.4 10*3/uL (ref 0.7–4.0)
MCHC: 33.6 g/dL (ref 30.0–36.0)
MCV: 88.8 fl (ref 78.0–100.0)
Monocytes Absolute: 0.7 10*3/uL (ref 0.1–1.0)
Monocytes Relative: 7.4 % (ref 3.0–12.0)
Neutro Abs: 4.5 10*3/uL (ref 1.4–7.7)
Neutrophils Relative %: 49.7 % (ref 43.0–77.0)
Platelets: 240 10*3/uL (ref 150.0–400.0)
RBC: 3.65 Mil/uL — ABNORMAL LOW (ref 3.87–5.11)
RDW: 13.8 % (ref 11.5–15.5)
WBC: 9.1 10*3/uL (ref 4.0–10.5)

## 2023-06-24 LAB — BASIC METABOLIC PANEL WITH GFR
BUN: 16 mg/dL (ref 6–23)
CO2: 26 meq/L (ref 19–32)
Calcium: 9.4 mg/dL (ref 8.4–10.5)
Chloride: 103 meq/L (ref 96–112)
Creatinine, Ser: 0.89 mg/dL (ref 0.40–1.20)
GFR: 58.67 mL/min — ABNORMAL LOW (ref >60.00)
Glucose, Bld: 122 mg/dL — ABNORMAL HIGH (ref 70–99)
Potassium: 4.1 meq/L (ref 3.5–5.1)
Sodium: 137 meq/L (ref 135–145)

## 2023-06-24 NOTE — Progress Notes (Signed)
 Patient ID: Hannah Madden, female   DOB: 1934-06-23, 89 y.o.   MRN: [id number]        Chief Complaint: follow up post hospn apr 18 - 22 2025 with renal mass /  mesenteric mass / liver nodules, , UTI, presyncope       HPI:  Hannah Madden is a 89 y.o. female here with daughter overall doing ok, but has 3 days urinary frequency, but Denies urinary symptoms such as dysuria, urgency, flank pain, hematuria or n/v, fever, chills.  Pt denies chest pain, increased sob or doe, wheezing, orthopnea, PND, increased LE swelling, palpitations, dizziness or syncope.   Pt denies polydipsia, polyuria, or new focal neuro s/s.    Pt denies night sweats, loss of appetite, or other constitutional symptoms though has lost 6 lbs recently.  Recent biopsy results pending at this time.        Wt Readings from Last 3 Encounters:  06/24/23 110 lb (49.9 kg)  06/22/23 112 lb 7 oz (51 kg)  04/17/23 116 lb 3.2 oz (52.7 kg)   BP Readings from Last 3 Encounters:  06/24/23 124/80  06/23/23 (!) 145/67  04/17/23 118/80         Past Medical History:  Diagnosis Date   Alopecia    stress related and resolved since her Mother moved to SNF   GERD (gastroesophageal reflux disease)    takes Nexium  daily   Glaucoma    uses eye drops daily   H/O hiatal hernia    Hypercholesteremia    takes Simvastatin  daily   Pneumonia 03/03/1958   Vitamin D  deficiency 12/24/2019   Past Surgical History:  Procedure Laterality Date   ABDOMINAL HYSTERECTOMY     fibroids.   COLONOSCOPY     KNEE ARTHROSCOPY WITH MEDIAL MENISECTOMY Left 09/12/2013   Procedure: KNEE ARTHROSCOPY WITH DEBRIDEMENT;  Surgeon: Jasmine Mesi, MD;  Location: [facility] OR;  Service: Orthopedics;  Laterality: Left;    reports that she has never smoked. She has never used smokeless tobacco. She reports that she does not drink alcohol  and does not use drugs. family history is not on file. Allergies  Allergen Reactions   Atorvastatin     REACTION: INTOL to Lipitor  w/ leg pain   Macrobid [Nitrofurantoin] Other (See Comments)    diarrhea   Penicillins     REACTION: hives   Ciprofloxacin  Rash   Current Outpatient Medications on File Prior to Visit  Medication Sig Dispense Refill   bisacodyl  (DULCOLAX) 10 MG suppository Place 1 suppository (10 mg total) rectally daily as needed for moderate constipation. (Patient not taking: Reported on 06/24/2023) 12 suppository 0   brimonidine -timolol  (COMBIGAN ) 0.2-0.5 % ophthalmic solution Place 1 drop into the right eye 2 (two) times daily. (Patient not taking: Reported on 06/24/2023)     brinzolamide  (AZOPT ) 1 % ophthalmic suspension Place 1 drop into the right eye 2 (two) times daily. (Patient not taking: Reported on 06/24/2023)     docusate sodium  (COLACE) 100 MG capsule Take 1 capsule (100 mg total) by mouth 2 (two) times daily. (Patient not taking: Reported on 06/24/2023) 10 capsule 0   ondansetron  (ZOFRAN ) 4 MG tablet Take 1 tablet (4 mg total) by mouth every 8 (eight) hours as needed for nausea or vomiting. (Patient not taking: Reported on 06/24/2023) 90 tablet 3   pantoprazole  (PROTONIX ) 40 MG tablet Take 1 tablet (40 mg total) by mouth daily. (Patient not taking: Reported on 06/24/2023) 90 tablet 3   tobramycin (TOBREX)  0.3 % ophthalmic solution Place 1 drop into the right eye as needed (before eye injection). (Patient not taking: Reported on 06/24/2023)     No current facility-administered medications on file prior to visit.        ROS:  All others reviewed and negative.  Objective        PE:  BP 124/80 (BP Location: Right Arm, Patient Position: Sitting, Cuff Size: Normal)   Pulse 95   Temp 99.6 F (37.6 C) (Oral)   Ht 5\' 5"  (1.651 m)   Wt 110 lb (49.9 kg)   SpO2 99%   BMI 18.30 kg/m                 Constitutional: Pt appears in NAD, thin, in no pain               HENT: Head: NCAT.                Right Ear: External ear normal.                 Left Ear: External ear normal.                Eyes: .  Pupils are equal, round, and reactive to light. Conjunctivae and EOM are normal               Nose: without d/c or deformity               Neck: Neck supple. Gross normal ROM               Cardiovascular: Normal rate and regular rhythm.                 Pulmonary/Chest: Effort normal and breath sounds without rales or wheezing.                Abd:  Soft, NT, ND, + BS, no organomegaly               Neurological: Pt is alert. At baseline orientation, motor grossly intact               Skin: Skin is warm. No rashes, no other new lesions, LE edema - none               Psychiatric: Pt behavior is normal without agitation   Micro: none  Cardiac tracings I have personally interpreted today:  none  Pertinent Radiological findings (summarize): none   Lab Results  Component Value Date   WBC 9.2 06/23/2023   HGB 10.7 (L) 06/23/2023   HCT 35.1 (L) 06/23/2023   PLT 200 06/23/2023   GLUCOSE 87 06/23/2023   CHOL 179 04/17/2023   TRIG 116.0 04/17/2023   HDL 65.80 04/17/2023   LDLDIRECT 152.0 10/25/2015   LDLCALC 90 04/17/2023   ALT 11 06/23/2023   AST 21 06/23/2023   NA 137 06/23/2023   K 4.2 06/23/2023   CL 105 06/23/2023   CREATININE 0.71 06/23/2023   BUN 18 06/23/2023   CO2 20 (L) 06/23/2023   TSH 1.58 04/17/2023   INR 1.1 06/21/2023   HGBA1C 5.5 04/17/2023   MICROALBUR 3.5 (H) 04/17/2023   Assessment/Plan:  Hannah Madden is a 89 y.o. Black or African American [2] female with  has a past medical history of Alopecia, GERD (gastroesophageal reflux disease), Glaucoma, H/O hiatal hernia, Hypercholesteremia, Pneumonia (03/03/1958), and Vitamin D  deficiency (12/24/2019).  Dysuria Can't r/o recurrent uti - for f/u ua and culture, and  tx pending results  Intraabdominal mass Biopsy pending  pt to f/u with urology of renal ca related, o/w has been also referred to oncology  Hyperglycemia Lab Results  Component Value Date   HGBA1C 5.5 04/17/2023   Stable, pt to continue current  medical treatment - diet, wt control  Followup: Return in about 2 months (around 08/24/2023).  [name], MD 06/24/2023 8:17 PM [facility] Internal Medicine

## 2023-06-24 NOTE — Assessment & Plan Note (Signed)
 Biopsy pending  pt to f/u with urology of renal ca related, o/w has been also referred to oncology

## 2023-06-24 NOTE — Patient Instructions (Signed)
 Please continue all other medications as before, and refills have been done if requested.  Please have the pharmacy call with any other refills you may need.  Please continue your efforts at being more active, low cholesterol diet, and weight control.  You are otherwise up to date with prevention measures today.  Please keep your appointments with your specialists as you may have planned  - please to contact urology if they have not called you  You will be contacted regarding the referral for: Oncology, if it seems Urology is not needed after the biopsy results  Please go to the LAB at the blood drawing area for the tests to be done  You will be contacted by phone if any changes need to be made immediately.  Otherwise, you will receive a letter about your results with an explanation, but please check with MyChart first.  Please make an Appointment to return in 2 months, or sooner if needed

## 2023-06-24 NOTE — Assessment & Plan Note (Signed)
 Can't r/o recurrent uti - for f/u ua and culture, and tx pending results

## 2023-06-24 NOTE — Assessment & Plan Note (Signed)
 Lab Results  Component Value Date   HGBA1C 5.5 04/17/2023   Stable, pt to continue current medical treatment  - diet, wt control

## 2023-06-24 NOTE — Transitions of Care (Post Inpatient/ED Visit) (Unsigned)
   06/24/2023  Name: CADDIE RANDLE MRN: [id number] DOB: [birth year]  Today's TOC FU Call Status: Today's TOC FU Call Status:: Unsuccessful Call (1st Attempt) Unsuccessful Call (1st Attempt) Date: 06/24/23  Attempted to reach the patient regarding the most recent Inpatient/ED visit.  Follow Up Plan: Additional outreach attempts will be made to reach the patient to complete the Transitions of Care (Post Inpatient/ED visit) call.   Signature Darrall Ellison, LPN [facility] Nurse Health Advisor Direct Dial [phone]

## 2023-06-25 ENCOUNTER — Encounter: Payer: Self-pay | Admitting: Internal Medicine

## 2023-06-25 ENCOUNTER — Telehealth: Payer: Self-pay | Admitting: Vascular Surgery

## 2023-06-25 LAB — URINALYSIS, ROUTINE W REFLEX MICROSCOPIC
Bilirubin Urine: NEGATIVE
Hgb urine dipstick: NEGATIVE
Ketones, ur: NEGATIVE
Nitrite: NEGATIVE
RBC / HPF: NONE SEEN (ref 0–?)
Specific Gravity, Urine: 1.015 (ref 1.000–1.030)
Total Protein, Urine: NEGATIVE
Urine Glucose: NEGATIVE
Urobilinogen, UA: 0.2 (ref 0.0–1.0)
pH: 6 (ref 5.0–8.0)

## 2023-06-25 LAB — URINE CULTURE: Result:: NO GROWTH

## 2023-06-25 LAB — SURGICAL PATHOLOGY

## 2023-06-25 NOTE — Transitions of Care (Post Inpatient/ED Visit) (Unsigned)
   06/25/2023  Name: Hannah Madden MRN: [id number] DOB: Jan 13, 1935  Today's TOC FU Call Status: Today's TOC FU Call Status:: Unsuccessful Call (2nd Attempt) Unsuccessful Call (1st Attempt) Date: 06/24/23 Unsuccessful Call (2nd Attempt) Date: 06/25/23  Attempted to reach the patient regarding the most recent Inpatient/ED visit.  Follow Up Plan: Additional outreach attempts will be made to reach the patient to complete the Transitions of Care (Post Inpatient/ED visit) call.   Signature Darrall Ellison, LPN [facility] Nurse Health Advisor Direct Dial [phone]

## 2023-06-25 NOTE — Progress Notes (Signed)
 The test results show that your current treatment is OK, as the tests are stable.  Please continue the same plan.  There is no other need for change of treatment or further evaluation based on these results, at this time.  thanks

## 2023-06-25 NOTE — Telephone Encounter (Signed)
 Hospitalized at [facility].  Can she get outpatient evaluation for her carotid disease in the next month?  Not urgent.  No studies had carotid duplex already.   Thanks,   Hannah Madden

## 2023-06-26 MED FILL — Ondansetron HCl Inj 4 MG/2ML (2 MG/ML): INTRAMUSCULAR | Qty: 2 | Status: AC

## 2023-06-26 NOTE — Transitions of Care (Post Inpatient/ED Visit) (Signed)
   06/26/2023  Name: SIDRAH HARDEN MRN: [id number] DOB: January 16, 1935  Today's TOC FU Call Status: Today's TOC FU Call Status:: Unsuccessful Call (3rd Attempt) Unsuccessful Call (1st Attempt) Date: 06/24/23 Unsuccessful Call (2nd Attempt) Date: 06/25/23 Unsuccessful Call (3rd Attempt) Date: 06/26/23  Attempted to reach the patient regarding the most recent Inpatient/ED visit.  Follow Up Plan: No further outreach attempts will be made at this time. We have been unable to contact the patient.  Signature Darrall Ellison, LPN [facility] Nurse Health Advisor Direct Dial [phone]

## 2023-06-29 ENCOUNTER — Telehealth: Payer: Self-pay

## 2023-06-29 ENCOUNTER — Other Ambulatory Visit: Payer: Self-pay

## 2023-06-29 ENCOUNTER — Encounter: Payer: Self-pay | Admitting: *Deleted

## 2023-06-29 NOTE — Progress Notes (Signed)
 PATIENT NAVIGATOR PROGRESS NOTE  Name: Hannah Madden Date: 06/29/2023 MRN: [id number]  DOB: [birth year]   Reason for visit:  Introductory telephone call  Comments:  Called and spoke with [name], pt's daughter and discussed tomorrows appt. We dicussed directions to building and parking as well as one person allowed in the appt. Verbalized understanding and appreciation of call    Time spent counseling/coordinating care: 30-45 minutes

## 2023-06-29 NOTE — Progress Notes (Unsigned)
 Patient name: Hannah Madden MRN: [id number] DOB: 1934/05/30 Sex: female  REASON FOR CONSULT: Carotid artery evaluation  HPI: Hannah Madden is a 89 y.o. female, with history hyperlipidemia that presents for evaluation of carotid artery disease.  Previously hospitalized with syncope versus presyncope and had a carotid duplex showing a greater than 50% left common carotid stenosis.  Ultimately was found to have renal mass with concern for liver metastasis.  Biopsy showed metastatic well-differentiated neuroendocrine tumor.  Past Medical History:  Diagnosis Date   Alopecia    stress related and resolved since her Mother moved to SNF   GERD (gastroesophageal reflux disease)    takes Nexium  daily   Glaucoma    uses eye drops daily   H/O hiatal hernia    Hypercholesteremia    takes Simvastatin  daily   Pneumonia 03/03/1958   Vitamin D  deficiency 12/24/2019    Past Surgical History:  Procedure Laterality Date   ABDOMINAL HYSTERECTOMY     fibroids.   COLONOSCOPY     KNEE ARTHROSCOPY WITH MEDIAL MENISECTOMY Left 09/12/2013   Procedure: KNEE ARTHROSCOPY WITH DEBRIDEMENT;  Surgeon: Jasmine Mesi, MD;  Location: [facility] OR;  Service: Orthopedics;  Laterality: Left;    Family History  Problem Relation Age of Onset   COPD Neg Hx    Diabetes Neg Hx    Heart disease Neg Hx    Stroke Neg Hx    Colon cancer Neg Hx    Esophageal cancer Neg Hx    Pancreatic cancer Neg Hx    Stomach cancer Neg Hx     SOCIAL HISTORY: Social History   Socioeconomic History   Marital status: Widowed    Spouse name: Not on file   Number of children: 4   Years of education: 16+   Highest education level: Not on file  Occupational History   Occupation: financial services    Comment: Museum/gallery exhibitions officer, now Chief Technology Officer  Tobacco Use   Smoking status: Never   Smokeless tobacco: Never  Vaping Use   Vaping status: Never Used  Substance and Sexual Activity   Alcohol  use: No   Drug use: No   Sexual  activity: Yes    Partners: Female    Birth control/protection: Surgical  Other Topics Concern   Not on file  Social History Narrative   [employer], [employer], [employer], [employer] - no degree. Married '56. 2 sons - '64, '71; 2 dtrs - '57, '59; 6 grandchildren. 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 She goes to the gym regularly, bowls, takes classes. ACP - discussed the "gift of Love" of ACP.   Social Drivers of Corporate investment banker Strain: Not on file  Food Insecurity: No Food Insecurity (06/20/2023)   Hunger Vital Sign    Worried About Running Out of Food in the Last Year: Never true    Ran Out of Food in the Last Year: Never true  Transportation Needs: No Transportation Needs (06/20/2023)   PRAPARE - Administrator, Civil Service (Medical): No    Lack of Transportation (Non-Medical): No  Physical Activity: Not on file  Stress: Not on file  Social Connections: Moderately Integrated (06/20/2023)   Social Connection and Isolation Panel [NHANES]    Frequency of Communication with Friends and Family: Three times a week    Frequency of Social Gatherings with Friends and Family: More than three times a week    Attends Religious  Services: More than 4 times per year    Active Member of Clubs or Organizations: Yes    Attends Banker Meetings: More than 4 times per year    Marital Status: Widowed  Intimate Partner Violence: Not At Risk (06/20/2023)   Humiliation, Afraid, Rape, and Kick questionnaire    Fear of Current or Ex-Partner: No    Emotionally Abused: No    Physically Abused: No    Sexually Abused: No    Allergies  Allergen Reactions   Atorvastatin     REACTION: INTOL to Lipitor w/ leg pain   Macrobid [Nitrofurantoin] Other (See Comments)    diarrhea   Penicillins     REACTION: hives   Ciprofloxacin  Rash    Current Outpatient Medications  Medication Sig Dispense  Refill   bisacodyl  (DULCOLAX) 10 MG suppository Place 1 suppository (10 mg total) rectally daily as needed for moderate constipation. (Patient not taking: Reported on 06/24/2023) 12 suppository 0   brimonidine -timolol  (COMBIGAN ) 0.2-0.5 % ophthalmic solution Place 1 drop into the right eye 2 (two) times daily. (Patient not taking: Reported on 06/24/2023)     brinzolamide  (AZOPT ) 1 % ophthalmic suspension Place 1 drop into the right eye 2 (two) times daily. (Patient not taking: Reported on 06/24/2023)     docusate sodium  (COLACE) 100 MG capsule Take 1 capsule (100 mg total) by mouth 2 (two) times daily. (Patient not taking: Reported on 06/24/2023) 10 capsule 0   ondansetron  (ZOFRAN ) 4 MG tablet Take 1 tablet (4 mg total) by mouth every 8 (eight) hours as needed for nausea or vomiting. (Patient not taking: Reported on 06/24/2023) 90 tablet 3   pantoprazole  (PROTONIX ) 40 MG tablet Take 1 tablet (40 mg total) by mouth daily. (Patient not taking: Reported on 06/24/2023) 90 tablet 3   tobramycin (TOBREX) 0.3 % ophthalmic solution Place 1 drop into the right eye as needed (before eye injection). (Patient not taking: Reported on 06/24/2023)     No current facility-administered medications for this visit.    REVIEW OF SYSTEMS:  [X]  denotes positive finding, [ ]  denotes negative finding Cardiac  Comments:  Chest pain or chest pressure: ***   Shortness of breath upon exertion:    Short of breath when lying flat:    Irregular heart rhythm:        Vascular    Pain in calf, thigh, or hip brought on by ambulation:    Pain in feet at night that wakes you up from your sleep:     Blood clot in your veins:    Leg swelling:         Pulmonary    Oxygen at home:    Productive cough:     Wheezing:         Neurologic    Sudden weakness in arms or legs:     Sudden numbness in arms or legs:     Sudden onset of difficulty speaking or slurred speech:    Temporary loss of vision in one eye:     Problems with  dizziness:         Gastrointestinal    Blood in stool:     Vomited blood:         Genitourinary    Burning when urinating:     Blood in urine:        Psychiatric    Major depression:         Hematologic    Bleeding problems:    Problems  with blood clotting too easily:        Skin    Rashes or ulcers:        Constitutional    Fever or chills:      PHYSICAL EXAM: There were no vitals filed for this visit.  GENERAL: The patient is a well-nourished female, in no acute distress. The vital signs are documented above. CARDIAC: There is a regular rate and rhythm.  VASCULAR: *** PULMONARY: There is good air exchange bilaterally without wheezing or rales. ABDOMEN: Soft and non-tender with normal pitched bowel sounds.  MUSCULOSKELETAL: There are no major deformities or cyanosis. NEUROLOGIC: No focal weakness or paresthesias are detected. SKIN: There are no ulcers or rashes noted. PSYCHIATRIC: The patient has a normal affect.  DATA:   ***  Assessment/Plan:  89 y.o. female, with history hyperlipidemia that presents for evaluation of carotid artery disease.  Previously hospitalized with syncope versus presyncope and had a carotid duplex showing a greater than 50% left common carotid stenosis.  Ultimately was found to have renal mass with concern for liver metastasis.  Biopsy showed metastatic well-differentiated neuroendocrine tumor.   Young Hensen, MD [facility] Office: [phone]

## 2023-06-29 NOTE — Telephone Encounter (Signed)
 LVM to remind patient of his appointment with Dr. Randye Buttner tomorrow at 0930 at the [facility] on [address].

## 2023-06-30 ENCOUNTER — Encounter: Payer: Self-pay | Admitting: Vascular Surgery

## 2023-06-30 ENCOUNTER — Ambulatory Visit: Attending: Vascular Surgery | Admitting: Vascular Surgery

## 2023-06-30 ENCOUNTER — Inpatient Hospital Stay

## 2023-06-30 ENCOUNTER — Inpatient Hospital Stay: Attending: Oncology | Admitting: Oncology

## 2023-06-30 VITALS — BP 158/78 | HR 75 | Temp 98.4°F | Resp 18 | Ht 65.0 in | Wt 112.2 lb

## 2023-06-30 VITALS — BP 134/72 | HR 82 | Temp 97.5°F | Resp 18 | Ht 65.0 in | Wt 113.8 lb

## 2023-06-30 DIAGNOSIS — I779 Disorder of arteries and arterioles, unspecified: Secondary | ICD-10-CM | POA: Insufficient documentation

## 2023-06-30 DIAGNOSIS — C7B8 Other secondary neuroendocrine tumors: Secondary | ICD-10-CM | POA: Insufficient documentation

## 2023-06-30 DIAGNOSIS — D649 Anemia, unspecified: Secondary | ICD-10-CM | POA: Diagnosis not present

## 2023-06-30 DIAGNOSIS — I6523 Occlusion and stenosis of bilateral carotid arteries: Secondary | ICD-10-CM

## 2023-06-30 DIAGNOSIS — N2889 Other specified disorders of kidney and ureter: Secondary | ICD-10-CM | POA: Diagnosis not present

## 2023-06-30 DIAGNOSIS — Z79899 Other long term (current) drug therapy: Secondary | ICD-10-CM | POA: Insufficient documentation

## 2023-06-30 DIAGNOSIS — C787 Secondary malignant neoplasm of liver and intrahepatic bile duct: Secondary | ICD-10-CM | POA: Diagnosis not present

## 2023-06-30 LAB — CMP (CANCER CENTER ONLY)
ALT: 16 U/L (ref 0–44)
AST: 24 U/L (ref 15–41)
Albumin: 4.5 g/dL (ref 3.5–5.0)
Alkaline Phosphatase: 85 U/L (ref 38–126)
Anion gap: 12 (ref 5–15)
BUN: 13 mg/dL (ref 8–23)
CO2: 22 mmol/L (ref 22–32)
Calcium: 9.8 mg/dL (ref 8.9–10.3)
Chloride: 104 mmol/L (ref 98–111)
Creatinine: 0.87 mg/dL (ref 0.44–1.00)
GFR, Estimated: >60 mL/min (ref >60)
Glucose, Bld: 84 mg/dL (ref 70–99)
Potassium: 4.4 mmol/L (ref 3.5–5.1)
Sodium: 138 mmol/L (ref 135–145)
Total Bilirubin: 0.3 mg/dL (ref 0.0–1.2)
Total Protein: 6.8 g/dL (ref 6.5–8.1)

## 2023-06-30 LAB — IRON AND TIBC
Iron: 78 ug/dL (ref 28–170)
Saturation Ratios: 24 % (ref 10.4–31.8)
TIBC: 332 ug/dL (ref 250–450)
UIBC: 254 ug/dL

## 2023-06-30 LAB — CBC WITH DIFFERENTIAL (CANCER CENTER ONLY)
Abs Immature Granulocytes: 0.04 10*3/uL (ref 0.00–0.07)
Basophils Absolute: 0 10*3/uL (ref 0.0–0.1)
Basophils Relative: 0 %
Eosinophils Absolute: 1.4 10*3/uL — ABNORMAL HIGH (ref 0.0–0.5)
Eosinophils Relative: 15 %
HCT: 32.2 % — ABNORMAL LOW (ref 36.0–46.0)
Hemoglobin: 10.5 g/dL — ABNORMAL LOW (ref 12.0–15.0)
Immature Granulocytes: 0 %
Lymphocytes Relative: 28 %
Lymphs Abs: 2.5 10*3/uL (ref 0.7–4.0)
MCH: 29 pg (ref 26.0–34.0)
MCHC: 32.6 g/dL (ref 30.0–36.0)
MCV: 89 fL (ref 80.0–100.0)
Monocytes Absolute: 0.6 10*3/uL (ref 0.1–1.0)
Monocytes Relative: 6 %
Neutro Abs: 4.4 10*3/uL (ref 1.7–7.7)
Neutrophils Relative %: 51 %
Platelet Count: 300 10*3/uL (ref 150–400)
RBC: 3.62 MIL/uL — ABNORMAL LOW (ref 3.87–5.11)
RDW: 13.4 % (ref 11.5–15.5)
WBC Count: 9 10*3/uL (ref 4.0–10.5)
nRBC: 0 % (ref 0.0–0.2)

## 2023-06-30 LAB — LACTATE DEHYDROGENASE: LDH: 143 U/L (ref 98–192)

## 2023-06-30 LAB — FERRITIN: Ferritin: 86 ng/mL (ref 11–307)

## 2023-06-30 NOTE — Progress Notes (Unsigned)
 [facility]  ONCOLOGY CONSULT NOTE   PATIENT NAME: Hannah Madden   MR#: [id number] DOB: [birth year]  DATE OF SERVICE: 06/30/2023   REFERRING PROVIDER  Hospital follow-up  Patient Care Team: Roslyn Coombe, MD as PCP - General (Internal Medicine) Tessa Figures, MD as Consulting Physician (Gastroenterology) [place], Heather, OD (Optometry) Devon Fogo, MD (Inactive) as Consulting Physician (Dermatology)    CHIEF COMPLAINT/ PURPOSE OF CONSULTATION:   Well-differentiated neuroendocrine tumor, metastatic to liver, grade 1.  ASSESSMENT & PLAN:   Hannah Madden is a 89 y.o. lady with a past medical history of GERD, glaucoma, dyslipidemia, hiatal hernia, alopecia, was referred to our clinic for newly diagnosed neuroendocrine tumor with multiple liver mets.  Also has renal mass, concerning for separate renal primary.  Metastatic malignant neuroendocrine tumor to liver [facility]) Please review oncology history for additional details and timeline of events.    CT abdomen and pelvis on 06/20/2023 showed multiple large heterogeneous liver lesions, largest measuring 10.6 cm in the right hepatic lobe and 5.7 cm in the left hepatic lobe.  Also noted was heterogeneous and has a mass in the superior pole of the right kidney measuring 3.5 cm, concerning for renal cell carcinoma.  Mesenteric mass or lymphadenopathy in the left central mesentery measuring 3.9 cm.  CT chest showed no evidence of metastatic disease in the chest.  Urology and interventional radiology was consulted.  Though she may have renal cell carcinoma, liver metastatic disease was not felt to be related as it is not common for renal cell carcinoma to metastasize to liver.  She may have a lesion in the bladder based on the CT imaging and hence outpatient cystoscopy and further workup was planned by urology.  On 06/22/2023, she underwent ultrasound-guided biopsy of the liver lesion.  Pathology showed  well-differentiated neuroendocrine tumor, grade 1, Ki-67 index of 2%.  The neoplastic cells were diffusely and strongly positive for the neuroendocrine markers chromogranin and synaptic ficin.  They were negative for cytokeratin 7 and cytokeratin 20.  The tumor exhibits focal patchy nonspecific positivity for PAX 8 which is of uncertain significance.    Today I discussed diagnosis, prognosis, plan of care, treatment options.  Reviewed NCCN guidelines.  Stage IV disease because of extensive liver metastatic disease.  The tumor is well-differentiated, low-grade, with a Ki-67 of 2%, indicating slow growth.  She does not have any carcinoid symptoms.  Despite its indolent nature, the significant hepatic tumor burden necessitates treatment to prevent liver function compromise. The primary site is suspected to be the gut lining but is not definitively identified. The tumor is not expected to significantly affect life expectancy. Treatment aims to control disease progression and prevent further spread, as a complete cure is not feasible due to extensive liver involvement.  -Today we submitted request for PET dotatate scan to establish a baseline for future comparison and assess the extent of neuroendocrine tumor involvement. - Initiate monthly octreotide injections post-PET scan to manage liver tumor burden.  - Arrange an education session to discuss octreotide side effects, primarily injection site pain and constipation.  Chromogranin A is pending from today.  I will plan to see her when she is due for the second dose of octreotide injection.  Right kidney mass Tumor located at the upper pole of the right kidney, measuring approximately 3.9 cm. Imaging suggests renal carcinoma, but further evaluation is required. Urology will assess the possibility of surgical resection versus ablation.  She does have an appointment  coming up with their office next week.    The tumor is distinct from the neuroendocrine  tumor and is suspected to be primary renal cancer. Surgical resection is preferred if feasible to obtain tissue for definitive diagnosis and to assess the grade of the tumor.  - Attend urology appointment next Monday for further evaluation and management plan.  - Coordinate with urology for potential laparoscopic surgery or other interventions.   I reviewed lab results and outside records for this visit and discussed relevant results with the patient. Diagnosis, plan of care and treatment options were also discussed in detail with the patient. Opportunity provided to ask questions and answers provided to her apparent satisfaction. Provided instructions to call our clinic with any problems, questions or concerns prior to return visit. I recommended to continue follow-up with PCP and sub-specialists. She verbalized understanding and agreed with the plan. No barriers to learning was detected.  NCCN guidelines have been consulted in the planning of this patient's care.  Arlo Berber, MD  06/30/2023 10:00 AM  [facility] - A DEPT OF [facility] [id number] [place] [address] Dept: [phone] Dept Fax: [phone]   HISTORY OF PRESENTING ILLNESS:   I have reviewed her chart and materials related to her cancer extensively and collaborated history with the patient. Summary of oncologic history is as follows:  ONCOLOGY HISTORY:  Patient presented to the ED with complaints of worsening abdominal pain, back pain and near syncope on 06/19/2023 and was hospitalized for further evaluation.  CT abdomen and pelvis on 06/20/2023 showed multiple large heterogeneous liver lesions, largest measuring 10.6 cm in the right hepatic lobe and 5.7 cm in the left hepatic lobe.  Also noted was heterogeneous and has a mass in the superior pole of the right kidney measuring 3.5 cm, concerning for renal cell carcinoma.  Mesenteric mass or  lymphadenopathy in the left central mesentery measuring 3.9 cm.  CT chest showed no evidence of metastatic disease in the chest.  Urology and interventional radiology was consulted.  Though she may have renal cell carcinoma, liver metastatic disease was not felt to be related as it is not common for renal cell carcinoma to metastasize to liver.  She may have a lesion in the bladder based on the CT imaging and hence outpatient cystoscopy and further workup was planned by urology.  On 06/22/2023, she underwent ultrasound-guided biopsy of the liver lesion.  Pathology showed well-differentiated neuroendocrine tumor, grade 1, Ki-67 index of 2%.  The neoplastic cells were diffusely and strongly positive for the neuroendocrine markers chromogranin and synaptic ficin.  They were negative for cytokeratin 7 and cytokeratin 20.  The tumor exhibits focal patchy nonspecific positivity for PAX 8 which is of uncertain significance.    On her initial consultation with us  on 06/30/2023, request submitted for PET dotatate scan.  Following the scan, plan is to start her on octreotide injections, given degree of tumor burden.  Patient does not have carcinoid symptoms.  She will continue follow-up with urology for evaluation and management of the right kidney mass including possible partial nephrectomy versus ablative measures.  INTERVAL HISTORY:  Discussed the use of AI scribe software for clinical note transcription with the patient, who gave verbal consent to proceed.  History of Present Illness Hannah Madden is an 89 year old lady with a neuroendocrine tumor who presents for follow-up after recent hospitalization for pain and near syncope.  She was recently  hospitalized due to severe pain and near syncope. The pain was primarily in her lower back and was accompanied by nausea. She has experienced intermittent back pain and nausea for at least four years. During the recent episode, both symptoms occurred  simultaneously, which was unusual for her. She currently has no pain.  She has experienced weight loss, approximately ten pounds, over an extended period. Her appetite is currently excellent. She has a history of nausea and vomiting, particularly after consuming acidic foods, which she is allergic to. Despite this, she enjoys these foods, especially oranges. She feels nauseous almost daily, a symptom she has been aware of for about forty years.  She reports regular bowel movements but experiences diarrhea. No wheezing, shortness of breath, dizziness, or lightheadedness currently, although she experienced dizziness during her recent hospitalization. No palpitations.  A recent CT scan revealed multiple spots in her liver, with the largest measuring 10.6 cm, and a tumor in her right kidney measuring about 3.9 cm. There is also a spot in the left side of her abdomen, possibly a lymph node or on the lining of the intestines.    MEDICAL HISTORY:  Past Medical History:  Diagnosis Date   Alopecia    stress related and resolved since her Mother moved to SNF   GERD (gastroesophageal reflux disease)    takes Nexium  daily   Glaucoma    uses eye drops daily   H/O hiatal hernia    Hypercholesteremia    takes Simvastatin  daily   Pneumonia 03/03/1958   Vitamin D  deficiency 12/24/2019    SURGICAL HISTORY: Past Surgical History:  Procedure Laterality Date   ABDOMINAL HYSTERECTOMY     fibroids.   COLONOSCOPY     KNEE ARTHROSCOPY WITH MEDIAL MENISECTOMY Left 09/12/2013   Procedure: KNEE ARTHROSCOPY WITH DEBRIDEMENT;  Surgeon: Jasmine Mesi, MD;  Location: [facility] OR;  Service: Orthopedics;  Laterality: Left;    SOCIAL HISTORY: Social History   Socioeconomic History   Marital status: Widowed    Spouse name: Not on file   Number of children: 4   Years of education: 16+   Highest education level: Not on file  Occupational History   Occupation: financial services    Comment: Museum/gallery exhibitions officer, now  Chief Technology Officer  Tobacco Use   Smoking status: Never   Smokeless tobacco: Never  Vaping Use   Vaping status: Never Used  Substance and Sexual Activity   Alcohol  use: No   Drug use: No   Sexual activity: Yes    Partners: Female    Birth control/protection: Surgical  Other Topics Concern   Not on file  Social History Narrative   HS[employer], [employer], [employer], [employer] - no degree. Married '56. 2 sons - '64, '71; 2 dtrs - '57, '59; 6 grandchildren. 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 She goes to the gym regularly, bowls, takes classes. ACP - discussed the "gift of Love" of ACP.   Social Drivers of Corporate investment banker Strain: Not on file  Food Insecurity: No Food Insecurity (06/20/2023)   Hunger Vital Sign    Worried About Running Out of Food in the Last Year: Never true    Ran Out of Food in the Last Year: Never true  Transportation Needs: No Transportation Needs (06/20/2023)   PRAPARE - Administrator, Civil Service (Medical): No    Lack of Transportation (Non-Medical): No  Physical Activity: Not on file  Stress:  Not on file  Social Connections: Moderately Integrated (06/20/2023)   Social Connection and Isolation Panel [NHANES]    Frequency of Communication with Friends and Family: Three times a week    Frequency of Social Gatherings with Friends and Family: More than three times a week    Attends Religious Services: More than 4 times per year    Active Member of Clubs or Organizations: Yes    Attends Banker Meetings: More than 4 times per year    Marital Status: Widowed  Intimate Partner Violence: Not At Risk (06/20/2023)   Humiliation, Afraid, Rape, and Kick questionnaire    Fear of Current or Ex-Partner: No    Emotionally Abused: No    Physically Abused: No    Sexually Abused: No    FAMILY HISTORY: Family History  Problem Relation Age of Onset   COPD Neg Hx     Diabetes Neg Hx    Heart disease Neg Hx    Stroke Neg Hx    Colon cancer Neg Hx    Esophageal cancer Neg Hx    Pancreatic cancer Neg Hx    Stomach cancer Neg Hx     ALLERGIES:  She is allergic to atorvastatin, macrobid [nitrofurantoin], penicillins, and ciprofloxacin .  MEDICATIONS:  Current Outpatient Medications  Medication Sig Dispense Refill   brimonidine -timolol  (COMBIGAN ) 0.2-0.5 % ophthalmic solution Place 1 drop into the right eye 2 (two) times daily. (Patient not taking: Reported on 06/24/2023)     brinzolamide  (AZOPT ) 1 % ophthalmic suspension Place 1 drop into the right eye 2 (two) times daily. (Patient not taking: Reported on 06/24/2023)     docusate sodium  (COLACE) 100 MG capsule Take 1 capsule (100 mg total) by mouth 2 (two) times daily. (Patient not taking: Reported on 06/24/2023) 10 capsule 0   ondansetron  (ZOFRAN ) 4 MG tablet Take 1 tablet (4 mg total) by mouth every 8 (eight) hours as needed for nausea or vomiting. (Patient not taking: Reported on 06/24/2023) 90 tablet 3   pantoprazole  (PROTONIX ) 40 MG tablet Take 1 tablet (40 mg total) by mouth daily. (Patient not taking: Reported on 06/24/2023) 90 tablet 3   tobramycin (TOBREX) 0.3 % ophthalmic solution Place 1 drop into the right eye as needed (before eye injection). (Patient not taking: Reported on 06/24/2023)     No current facility-administered medications for this visit.    REVIEW OF SYSTEMS:    Review of Systems - Oncology  All other pertinent systems were reviewed with the patient and are negative.  PHYSICAL EXAMINATION:   Onc Performance Status - 07/01/23 1600       ECOG Perf Status   ECOG Perf Status Ambulatory and capable of all selfcare but unable to carry out any work activities.  Up and about more than 50% of waking hours      KPS SCALE   KPS % SCORE Normal activity with effort, some s/s of disease             Vitals:   06/30/23 0955  BP: (!) 158/78  Pulse: 75  Resp: 18  Temp: 98.4 F  (36.9 C)  SpO2: 100%   Filed Weights   06/30/23 0955  Weight: 112 lb 3.2 oz (50.9 kg)    Physical Exam Constitutional:      General: She is not in acute distress.    Appearance: Normal appearance.  HENT:     Head: Normocephalic and atraumatic.  Eyes:     General: No scleral icterus.  Conjunctiva/sclera: Conjunctivae normal.  Cardiovascular:     Rate and Rhythm: Normal rate and regular rhythm.     Heart sounds: Normal heart sounds.  Pulmonary:     Effort: Pulmonary effort is normal.     Breath sounds: Normal breath sounds.  Abdominal:     General: There is no distension.  Musculoskeletal:     Right lower leg: No edema.     Left lower leg: No edema.  Neurological:     General: No focal deficit present.     Mental Status: She is alert and oriented to person, place, and time.  Psychiatric:        Mood and Affect: Mood normal.        Behavior: Behavior normal.        Thought Content: Thought content normal.      LABORATORY DATA:   I have reviewed the data as listed.  Results for orders placed or performed in visit on 06/30/23  Ferritin  Result Value Ref Range   Ferritin 86 11 - 307 ng/mL  Iron and TIBC  Result Value Ref Range   Iron 78 28 - 170 ug/dL   TIBC 960 454 - 098 ug/dL   Saturation Ratios 24 10.4 - 31.8 %   UIBC 254 ug/dL  Lactate dehydrogenase  Result Value Ref Range   LDH 143 98 - 192 U/L  CMP (Cancer Center only)  Result Value Ref Range   Sodium 138 135 - 145 mmol/L   Potassium 4.4 3.5 - 5.1 mmol/L   Chloride 104 98 - 111 mmol/L   CO2 22 22 - 32 mmol/L   Glucose, Bld 84 70 - 99 mg/dL   BUN 13 8 - 23 mg/dL   Creatinine 1.19 1.47 - 1.00 mg/dL   Calcium  9.8 8.9 - 10.3 mg/dL   Total Protein 6.8 6.5 - 8.1 g/dL   Albumin 4.5 3.5 - 5.0 g/dL   AST 24 15 - 41 U/L   ALT 16 0 - 44 U/L   Alkaline Phosphatase 85 38 - 126 U/L   Total Bilirubin 0.3 0.0 - 1.2 mg/dL   GFR, Estimated >82 >95 mL/min   Anion gap 12 5 - 15  CBC with Differential (Cancer  Center Only)  Result Value Ref Range   WBC Count 9.0 4.0 - 10.5 K/uL   RBC 3.62 (L) 3.87 - 5.11 MIL/uL   Hemoglobin 10.5 (L) 12.0 - 15.0 g/dL   HCT 62.1 (L) 30.8 - 65.7 %   MCV 89.0 80.0 - 100.0 fL   MCH 29.0 26.0 - 34.0 pg   MCHC 32.6 30.0 - 36.0 g/dL   RDW 84.6 96.2 - 95.2 %   Platelet Count 300 150 - 400 K/uL   nRBC 0.0 0.0 - 0.2 %   Neutrophils Relative % 51 %   Neutro Abs 4.4 1.7 - 7.7 K/uL   Lymphocytes Relative 28 %   Lymphs Abs 2.5 0.7 - 4.0 K/uL   Monocytes Relative 6 %   Monocytes Absolute 0.6 0.1 - 1.0 K/uL   Eosinophils Relative 15 %   Eosinophils Absolute 1.4 (H) 0.0 - 0.5 K/uL   Basophils Relative 0 %   Basophils Absolute 0.0 0.0 - 0.1 K/uL   Immature Granulocytes 0 %   Abs Immature Granulocytes 0.04 0.00 - 0.07 K/uL     RADIOGRAPHIC STUDIES:  I have personally reviewed the radiological images as listed and agree with the findings in the report.  US  BIOPSY (LIVER) Result Date: 06/22/2023  INDICATION: [id number] Intraabdominal mass [id number].  Liver masses.  No diagnosis. EXAM: ULTRASOUND GUIDED LIVER MASS BIOPSY COMPARISON:  CT AP, 06/19/2023 MEDICATIONS: None ANESTHESIA/SEDATION: Moderate (conscious) sedation was employed during this procedure. A total of Versed  2 mg and Fentanyl  100 mcg was administered intravenously. Moderate Sedation Time: 15 minutes. The patient's level of consciousness and vital signs were monitored continuously by radiology nursing throughout the procedure under my direct supervision. COMPLICATIONS: None immediate. PROCEDURE: Informed written consent was obtained from the patient and/or patient's representative after a discussion of the risks, benefits and alternatives to treatment. The patient understands and consents the procedure. A timeout was performed prior to the initiation of the procedure. Ultrasound scanning was performed of the right upper abdominal quadrant demonstrates multifocal liver masses, largest within the RIGHT upper lobe measuring up  to 10 cm A LEFT hepatic lobe mass was selected for biopsy and the procedure was planned. The right upper abdominal quadrant was prepped and draped in the usual sterile fashion. The overlying soft tissues were anesthetized with 1% lidocaine . A 17 gauge co-axial needle was advanced into a peripheral aspect of the lesion. This was followed by 4 core biopsies with an 18 gauge core device under direct ultrasound guidance. The needle was removed, then superficial hemostasis was obtained with manual compression. Post procedural scanning was negative for definitive area of hemorrhage or additional complication. A dressing was placed. The patient tolerated the procedure well without immediate post procedural complication. IMPRESSION: Successful ultrasound-guided core needle biopsy of a liver mass. Art Largo, MD [facility] Electronically Signed   By: Art Largo M.D.   On: 06/22/2023 16:56   CT CHEST WO CONTRAST Result Date: 06/22/2023 CLINICAL DATA:  Staging for intra-abdominal/right renal mass with liver metastases. * Tracking Code: BO * EXAM: CT CHEST WITHOUT CONTRAST TECHNIQUE: Multidetector CT imaging of the chest was performed following the standard protocol without IV contrast. RADIATION DOSE REDUCTION: This exam was performed according to the departmental dose-optimization program which includes automated exposure control, adjustment of the mA and/or kV according to patient size and/or use of iterative reconstruction technique. COMPARISON:  CT abdomen pelvis 06/19/2023. FINDINGS: Cardiovascular: Atherosclerotic calcification of the aorta, aortic valve and coronary arteries. Heart size normal. No pericardial effusion. Mediastinum/Nodes: Low-attenuation lesions in the thyroid  measure up to 1.4 cm. No follow-up recommended. (Ref: J Am Coll Radiol. 2015 Feb;12(2): 143-50).No pathologically enlarged mediastinal or axillary lymph nodes. Hilar regions are  difficult to definitively evaluate without IV contrast. Air in the esophagus can be seen with dysmotility. Lungs/Pleura: Minimal dependent atelectasis. No suspicious pulmonary nodules. Trace right pleural fluid. Airway is unremarkable. Upper Abdomen: Heterogeneous liver masses and right renal mass, better seen on yesterday's CT abdomen with contrast. Visualized portions of the liver, adrenal glands, kidneys, spleen, pancreas, stomach and bowel are otherwise grossly unremarkable. No upper abdominal adenopathy. Musculoskeletal: Degenerative changes in the spine.  Osteopenia. IMPRESSION: 1. No evidence of metastatic disease in the chest. 2. Heterogeneous liver masses and right renal mass, better evaluated on CT abdomen performed yesterday. 3. Trace right pleural fluid. 4. Aortic atherosclerosis (ICD10-I70.0). Coronary artery calcification. Electronically Signed   By: Shearon Denis M.D.   On: 06/22/2023 10:24   ECHOCARDIOGRAM COMPLETE Result Date: 06/21/2023    ECHOCARDIOGRAM REPORT   Patient Name:   CAITLYND LABROSSE Sparger Date of Exam: 06/21/2023 Medical Rec #:  [id number]        Height:       65.0 in Accession #:  [id number]       Weight:       117.0 lb Date of Birth:  October 04, 1934       BSA:          1.575 m Patient Age:    88 years         BP:           130/65 mmHg Patient Gender: F                HR:           85 bpm. Exam Location:  Inpatient Procedure: 2D Echo, Cardiac Doppler and Color Doppler (Both Spectral and Color            Flow Doppler were utilized during procedure). Indications:    Syncope  History:        Patient has no prior history of Echocardiogram examinations.  Sonographer:    Willey Harrier Referring Phys: [id number] DAVID MANUEL ORTIZ  Sonographer Comments: Technically difficult study due to poor echo windows. Image acquisition challenging due to patient body habitus. IMPRESSIONS  1. Left ventricular ejection fraction, by estimation, is 60 to 65%. The left ventricle has normal function. The left  ventricle has no regional wall motion abnormalities. Left ventricular diastolic parameters are indeterminate.  2. Right ventricular systolic function is normal. The right ventricular size is normal.  3. The mitral valve is normal in structure. No evidence of mitral valve regurgitation. No evidence of mitral stenosis.  4. The aortic valve is normal in structure. Aortic valve regurgitation is mild to moderate. Aortic valve sclerosis/calcification is present, without any evidence of aortic stenosis.  5. The inferior vena cava is normal in size with greater than 50% respiratory variability, suggesting right atrial pressure of 3 mmHg. FINDINGS  Left Ventricle: Left ventricular ejection fraction, by estimation, is 60 to 65%. The left ventricle has normal function. The left ventricle has no regional wall motion abnormalities. The left ventricular internal cavity size was normal in size. There is  no left ventricular hypertrophy. Left ventricular diastolic parameters are indeterminate. Right Ventricle: The right ventricular size is normal. No increase in right ventricular wall thickness. Right ventricular systolic function is normal. Left Atrium: Left atrial size was normal in size. Right Atrium: Right atrial size was normal in size. Pericardium: Trivial pericardial effusion is present. The pericardial effusion is circumferential and anterior to the right ventricle. Mitral Valve: The mitral valve is normal in structure. No evidence of mitral valve regurgitation. No evidence of mitral valve stenosis. MV peak gradient, 3.8 mmHg. The mean mitral valve gradient is 1.0 mmHg. Tricuspid Valve: The tricuspid valve is normal in structure. Tricuspid valve regurgitation is trivial. No evidence of tricuspid stenosis. Aortic Valve: The aortic valve is normal in structure. Aortic valve regurgitation is mild to moderate. Aortic regurgitation PHT measures 543 msec. Aortic valve sclerosis/calcification is present, without any evidence of  aortic stenosis. Aortic valve peak  gradient measures 3.9 mmHg. Pulmonic Valve: The pulmonic valve was normal in structure. Pulmonic valve regurgitation is not visualized. No evidence of pulmonic stenosis. Aorta: The aortic root is normal in size and structure. Venous: The inferior vena cava is normal in size with greater than 50% respiratory variability, suggesting right atrial pressure of 3 mmHg. IAS/Shunts: No atrial level shunt detected by color flow Doppler.  LEFT VENTRICLE PLAX 2D LVIDd:         4.30 cm   Diastology LVIDs:         3.00 cm  LV e' medial:  8.05 cm/s LV PW:         0.70 cm   LV e' lateral: 11.10 cm/s LV IVS:        0.70 cm LVOT diam:     2.00 cm LV SV:         56 LV SV Index:   36 LVOT Area:     3.14 cm  RIGHT VENTRICLE             IVC RV Basal diam:  3.00 cm     IVC diam: 1.20 cm RV S prime:     11.60 cm/s TAPSE (M-mode): 2.0 cm LEFT ATRIUM           Index        RIGHT ATRIUM          Index LA Vol (A2C): 19.9 ml 12.63 ml/m  RA Area:     7.01 cm LA Vol (A4C): 16.8 ml 10.67 ml/m  RA Volume:   11.60 ml 7.36 ml/m  AORTIC VALVE AV Area (Vmax): 2.59 cm AV Vmax:        98.90 cm/s AV Peak Grad:   3.9 mmHg LVOT Vmax:      81.60 cm/s LVOT Vmean:     61.100 cm/s LVOT VTI:       0.178 m AI PHT:         543 msec  AORTA Ao Root diam: 3.20 cm Ao Asc diam:  2.70 cm MITRAL VALVE             TRICUSPID VALVE MV Area VTI:  2.85 cm   TR Peak grad:   25.8 mmHg MV Peak grad: 3.8 mmHg   TR Vmax:        254.00 cm/s MV Mean grad: 1.0 mmHg MV Vmax:      0.97 m/s   SHUNTS MV Vmean:     56.7 cm/s  Systemic VTI:  0.18 m                          Systemic Diam: 2.00 cm Kardie Tobb DO Electronically signed by Jerryl Morin DO Signature Date/Time: 06/21/2023/11:05:47 AM    Final    VAS US  CAROTID Result Date: 06/21/2023 Carotid Arterial Duplex Study Patient Name:  DEMIAH SLIGH Komperda  Date of Exam:   06/20/2023 Medical Rec #: [id number]         Accession #:    [id number] Date of Birth: 12-27-1934        Patient Gender: F  Patient Age:   80 years Exam Location:  [facility] Procedure:      VAS US  CAROTID Referring Phys: DAVID ORTIZ --------------------------------------------------------------------------------  Indications:       Syncope. Risk Factors:      Hyperlipidemia. Comparison Study:  No prior exam on file. Performing Technologist: Ria Chad  Examination Guidelines: A complete evaluation includes B-mode imaging, spectral Doppler, color Doppler, and power Doppler as needed of all accessible portions of each vessel. Bilateral testing is considered an integral part of a complete examination. Limited examinations for reoccurring indications may be performed as noted.  Right Carotid Findings: +----------+--------+--------+--------+-------------------------+--------+           PSV cm/sEDV cm/sStenosisPlaque Description       Comments +----------+--------+--------+--------+-------------------------+--------+ CCA Prox  122     15                                                +----------+--------+--------+--------+-------------------------+--------+  CCA Distal63      12                                                +----------+--------+--------+--------+-------------------------+--------+ ICA Prox  60      13              heterogenous and calcific         +----------+--------+--------+--------+-------------------------+--------+ ICA Mid   93      23                                                +----------+--------+--------+--------+-------------------------+--------+ ICA Distal150     26                                       tortuous +----------+--------+--------+--------+-------------------------+--------+ ECA       80      12                                                +----------+--------+--------+--------+-------------------------+--------+ +----------+--------+-------+--------+-------------------+           PSV cm/sEDV cmsDescribeArm Pressure (mmHG)  +----------+--------+-------+--------+-------------------+ [id number]                                        +----------+--------+-------+--------+-------------------+ +---------+--------+--+--------+--+ VertebralPSV cm/s60EDV cm/s10 +---------+--------+--+--------+--+ Slightly elevated velocities due to tortuosity. Left Carotid Findings: +----------+--------+--------+--------+------------------+--------+           PSV cm/sEDV cm/sStenosisPlaque DescriptionComments +----------+--------+--------+--------+------------------+--------+ CCA Prox  136     17                                         +----------+--------+--------+--------+------------------+--------+ CCA Distal78      12                                         +----------+--------+--------+--------+------------------+--------+ ICA Prox  81      12                                         +----------+--------+--------+--------+------------------+--------+ ICA Mid   78      15                                         +----------+--------+--------+--------+------------------+--------+ ICA Distal123     24                                tortuous +----------+--------+--------+--------+------------------+--------+ ECA       76  16                                         +----------+--------+--------+--------+------------------+--------+ +----------+--------+--------+--------+-------------------+           PSV cm/sEDV cm/sDescribeArm Pressure (mmHG) +----------+--------+--------+--------+-------------------+ Subclavian122                                         +----------+--------+--------+--------+-------------------+ +---------+--------+--+--------+--+ VertebralPSV cm/s72EDV cm/s14 +---------+--------+--+--------+--+ Slightly elevated velocities due to tortuosity.  Summary: Right Carotid: Velocities in the right ICA are consistent with a 1-39% stenosis. Left Carotid: Velocities  in the left ICA are consistent with a 1-39% stenosis.               Hemodynamically significant plaque >50% visualized in the CCA. Vertebrals:  Bilateral vertebral arteries demonstrate antegrade flow. Subclavians: Normal flow hemodynamics were seen in bilateral subclavian              arteries. *See table(s) above for measurements and observations.  Electronically signed by Ardella Beaver MD on 06/21/2023 at 10:50:00 AM.    Final    CT ABDOMEN PELVIS W CONTRAST Result Date: 06/20/2023 CLINICAL DATA:  Acute nonlocalized abdominal pain EXAM: CT ABDOMEN AND PELVIS WITH CONTRAST TECHNIQUE: Multidetector CT imaging of the abdomen and pelvis was performed using the standard protocol following bolus administration of intravenous contrast. RADIATION DOSE REDUCTION: This exam was performed according to the departmental dose-optimization program which includes automated exposure control, adjustment of the mA and/or kV according to patient size and/or use of iterative reconstruction technique. CONTRAST:  100mL OMNIPAQUE  IOHEXOL  300 MG/ML  SOLN COMPARISON:  None Available. FINDINGS: Lower chest: No acute abnormality. Hepatobiliary: Multiple peripherally enhancing centrally hypoattenuating heterogenous lesions throughout the liver. These measure up to 10.6 cm in the right hepatic lobe and 5.7 cm in the left hepatic lobe. Unremarkable gallbladder and biliary tree. Pancreas: Unremarkable. Spleen: Unremarkable. Adrenals/Urinary Tract: Normal adrenal glands. Unremarkable left kidney. Heterogenous enhancing mass in the superior pole of the right kidney measuring 3.5 cm (series 5/image 22). No urinary calculi or hydronephrosis. Distended bladder. Stomach/Bowel: Stomach is within normal limits. No bowel wall thickening or bowel obstruction. Vascular/Lymphatic: Aortic atherosclerotic calcification. Mesenteric mass or lymphadenopathy in the left central mesentery measuring 3.9 cm (series 3/image 33). Reproductive: Hysterectomy.  No  adnexal mass. Other: No free intraperitoneal fluid or air. Musculoskeletal: No acute fracture or destructive osseous lesion. IMPRESSION: 1. Heterogenous enhancing mass in the superior pole of the right kidney measuring 3.5 cm, concerning for renal cell carcinoma. 2. Multiple large hepatic metastases. 3. Mesenteric mass or lymphadenopathy in the left central mesentery measuring 3.9 cm. 4. Aortic Atherosclerosis (ICD10-I70.0). Electronically Signed   By: Rozell Cornet M.D.   On: 06/20/2023 01:54    No orders of the defined types were placed in this encounter.   CODE STATUS:  Code Status History     Date Active Date Inactive Code Status Order ID Comments User Context   06/20/2023 0855 06/23/2023 1921 Full Code [id number]  Danice Dural, MD Inpatient   06/20/2023 0855 06/20/2023 0855 Full Code [id number]  Danice Dural, MD Inpatient   09/12/2013 1422 09/13/2013 1539 Full Code [id number]  Jasmine Mesi, MD Inpatient    Questions for Most Recent Historical Code Status (Order [id number])     Question Answer  By: Consent: discussion documented in EHR                Advance Directive Documentation    Flowsheet Row Most Recent Value  Type of Advance Directive Healthcare Power of Attorney, Living will  Pre-existing out of facility DNR order (yellow form or pink MOST form) --  "MOST" Form in Place? --       Future Appointments  Date Time Provider Department Center  08/24/2023  1:40 PM Roslyn Coombe, MD [facility] None     I spent a total of 60 minutes during this encounter with the patient including review of chart and various tests results, discussions about plan of care and coordination of care plan.  This document was completed utilizing speech recognition software. Grammatical errors, random word insertions, pronoun errors, and incomplete sentences are an occasional consequence of this system due to software limitations, ambient noise, and hardware issues. Any formal  questions or concerns about the content, text or information contained within the body of this dictation should be directly addressed to the provider for clarification.

## 2023-07-01 ENCOUNTER — Encounter: Payer: Self-pay | Admitting: Oncology

## 2023-07-01 DIAGNOSIS — N2889 Other specified disorders of kidney and ureter: Secondary | ICD-10-CM | POA: Insufficient documentation

## 2023-07-01 DIAGNOSIS — C7B8 Other secondary neuroendocrine tumors: Secondary | ICD-10-CM | POA: Insufficient documentation

## 2023-07-01 LAB — CHROMOGRANIN A: Chromogranin A (ng/mL): 1495 ng/mL — ABNORMAL HIGH (ref 0.0–101.8)

## 2023-07-01 NOTE — Assessment & Plan Note (Signed)
 Tumor located at the upper pole of the right kidney, measuring approximately 3.9 cm. Imaging suggests renal carcinoma, but further evaluation is required. Urology will assess the possibility of surgical resection versus ablation.  She does have an appointment coming up with their office next week.    The tumor is distinct from the neuroendocrine tumor and is suspected to be primary renal cancer. Surgical resection is preferred if feasible to obtain tissue for definitive diagnosis and to assess the grade of the tumor.  - Attend urology appointment next Monday for further evaluation and management plan.  - Coordinate with urology for potential laparoscopic surgery or other interventions.

## 2023-07-01 NOTE — Assessment & Plan Note (Addendum)
 Please review oncology history for additional details and timeline of events.    CT abdomen and pelvis on 06/20/2023 showed multiple large heterogeneous liver lesions, largest measuring 10.6 cm in the right hepatic lobe and 5.7 cm in the left hepatic lobe.  Also noted was heterogeneous and has a mass in the superior pole of the right kidney measuring 3.5 cm, concerning for renal cell carcinoma.  Mesenteric mass or lymphadenopathy in the left central mesentery measuring 3.9 cm.  CT chest showed no evidence of metastatic disease in the chest.  Urology and interventional radiology was consulted.  Though she may have renal cell carcinoma, liver metastatic disease was not felt to be related as it is not common for renal cell carcinoma to metastasize to liver.  She may have a lesion in the bladder based on the CT imaging and hence outpatient cystoscopy and further workup was planned by urology.  On 06/22/2023, she underwent ultrasound-guided biopsy of the liver lesion.  Pathology showed well-differentiated neuroendocrine tumor, grade 1, Ki-67 index of 2%.  The neoplastic cells were diffusely and strongly positive for the neuroendocrine markers chromogranin and synaptic ficin.  They were negative for cytokeratin 7 and cytokeratin 20.  The tumor exhibits focal patchy nonspecific positivity for PAX 8 which is of uncertain significance.    Today I discussed diagnosis, prognosis, plan of care, treatment options.  Reviewed NCCN guidelines.  Stage IV disease because of extensive liver metastatic disease.  The tumor is well-differentiated, low-grade, with a Ki-67 of 2%, indicating slow growth.  She does not have any carcinoid symptoms.  Despite its indolent nature, the significant hepatic tumor burden necessitates treatment to prevent liver function compromise. The primary site is suspected to be the gut lining but is not definitively identified. The tumor is not expected to significantly affect life expectancy. Treatment  aims to control disease progression and prevent further spread, as a complete cure is not feasible due to extensive liver involvement.  -Today we submitted request for PET dotatate scan to establish a baseline for future comparison and assess the extent of neuroendocrine tumor involvement. - Initiate monthly octreotide injections post-PET scan to manage liver tumor burden.  - Arrange an education session to discuss octreotide side effects, primarily injection site pain and constipation.  Chromogranin A is pending from today.  I will plan to see her when she is due for the second dose of octreotide injection.

## 2023-07-01 NOTE — Progress Notes (Signed)
 START ON PATHWAY REGIMEN - GI Neuroendocrine     A cycle is every 28 days:     Octreotide acetate [place]   **Always confirm dose/schedule in your pharmacy ordering system**  Patient Characteristics: GI Tract, Well-differentiated, Low-grade or Intermediate-grade, Unresectable, Treatment Indicated, First Line Tumor Location: GI Tract Tumor Grade: Low-grade Line of Therapy: First Line Intent of Therapy: Non-Curative / Palliative Intent, Discussed with Patient

## 2023-07-02 ENCOUNTER — Encounter: Payer: Self-pay | Admitting: Oncology

## 2023-07-06 DIAGNOSIS — D49511 Neoplasm of unspecified behavior of right kidney: Secondary | ICD-10-CM | POA: Diagnosis not present

## 2023-07-07 ENCOUNTER — Other Ambulatory Visit (HOSPITAL_COMMUNITY): Payer: Self-pay | Admitting: Urology

## 2023-07-07 DIAGNOSIS — D49511 Neoplasm of unspecified behavior of right kidney: Secondary | ICD-10-CM

## 2023-07-08 NOTE — Progress Notes (Signed)
 Hannah Asper, DO  Hannah Madden; Hannah Madden Ir Procedure Requests This can be deferred.  I have spoken to Dr. Dulcy Madden, and we do not need biopsy at this time.  Hannah Madden       Previous Messages    ----- Message ----- From: Hannah Madden Sent: 07/07/2023  11:30 AM EDT To: Hannah Madden; Ir Procedure Requests Subject: US  Kidney biopsy                              Procedure : US  Kidney Biopsy  Reason : eval for right renal mass and ablation Dx: Neoplasm of unspecified behavior of right kidney [N82.956 (ICD-10-CM)]    History :US  liver biopsy , CT Chest wo , CT abd pelvis w/   Provider : Andrez Banker, MD  Provider contact : [phone]

## 2023-07-09 ENCOUNTER — Encounter (HOSPITAL_COMMUNITY)
Admission: RE | Admit: 2023-07-09 | Discharge: 2023-07-09 | Disposition: A | Discharge status: Home / Self Care | Source: Ambulatory Visit | Attending: Oncology | Admitting: Oncology

## 2023-07-09 ENCOUNTER — Inpatient Hospital Stay

## 2023-07-09 DIAGNOSIS — C787 Secondary malignant neoplasm of liver and intrahepatic bile duct: Secondary | ICD-10-CM | POA: Diagnosis not present

## 2023-07-09 DIAGNOSIS — C7B8 Other secondary neuroendocrine tumors: Secondary | ICD-10-CM | POA: Insufficient documentation

## 2023-07-09 DIAGNOSIS — C7A8 Other malignant neuroendocrine tumors: Secondary | ICD-10-CM | POA: Diagnosis not present

## 2023-07-09 MED ORDER — COPPER CU 64 DOTATATE 1 MCI/ML IV SOLN
4.0000 | Freq: Once | INTRAVENOUS | Status: AC
  Administered 2023-07-09: 3.8451 via INTRAVENOUS

## 2023-07-10 ENCOUNTER — Inpatient Hospital Stay: Attending: Oncology

## 2023-07-10 DIAGNOSIS — C7B8 Other secondary neuroendocrine tumors: Secondary | ICD-10-CM | POA: Insufficient documentation

## 2023-07-10 DIAGNOSIS — Z79899 Other long term (current) drug therapy: Secondary | ICD-10-CM | POA: Insufficient documentation

## 2023-07-15 ENCOUNTER — Inpatient Hospital Stay

## 2023-07-15 ENCOUNTER — Other Ambulatory Visit: Payer: Self-pay

## 2023-07-15 ENCOUNTER — Telehealth: Payer: Self-pay | Admitting: Oncology

## 2023-07-15 VITALS — BP 134/60 | HR 78 | Temp 98.2°F | Resp 18

## 2023-07-15 DIAGNOSIS — C7B8 Other secondary neuroendocrine tumors: Secondary | ICD-10-CM | POA: Diagnosis not present

## 2023-07-15 DIAGNOSIS — Z79899 Other long term (current) drug therapy: Secondary | ICD-10-CM | POA: Diagnosis not present

## 2023-07-15 MED ORDER — OCTREOTIDE ACETATE 20 MG IM KIT
20.0000 mg | PACK | Freq: Once | INTRAMUSCULAR | Status: AC
  Administered 2023-07-15: 20 mg via INTRAMUSCULAR

## 2023-07-15 NOTE — Telephone Encounter (Signed)
 Patient has been scheduled for follow-up visit per 07/15/23 LOS.  LVM notifying pt of appt details, provided my direct number to pt if appt changes need to be made.

## 2023-07-15 NOTE — Progress Notes (Signed)
 This patient has had nausea for 1 week, continuous diarrhea which started yesterday, denies any blood or dark stools. Patient is wanting to have a sooner follow-up appointment either in person or phone visit visit with Dr. Randye Buttner either Friday afternoon or next week. Patient/ daughter wants to go over 07/09/23 PET scan results. Best contact is home phone [phone]. Made Dr. Randye Buttner and supporting nurse Gareld June RN aware via message.

## 2023-07-15 NOTE — Patient Instructions (Signed)
 Octreotide Injection Solution What is this medication? OCTREOTIDE (ok TREE oh tide) treats high levels of growth hormone (acromegaly). It works by reducing the amount of growth hormone your body makes. This reduces symptoms and the risk of health problems caused by too much growth hormone, such as diabetes and heart disease. It may also be used to treat diarrhea caused by neuroendocrine tumors. It works by slowing down the release of serotonin from the tumor cells. This reduces the number of bowel movements you have. This medicine may be used for other purposes; ask your health care provider or pharmacist if you have questions. COMMON BRAND NAME(S): Berline Lopes, Sandostatin What should I tell my care team before I take this medication? They need to know if you have any of these conditions: Diabetes Gallbladder disease Heart disease Kidney disease Liver disease Pancreatic disease Thyroid disease An unusual or allergic reaction to octreotide, other medications, foods, dyes, or preservatives Pregnant or trying to get pregnant Breastfeeding How should I use this medication? This medication is injected under the skin or into a vein. It is usually given by your care team in a hospital or clinic setting. If you get this medication at home, you will be taught how to prepare and give it. Use exactly as directed. Take it as directed on the prescription label at the same time every day. Keep taking it unless your care team tells you to stop. Allow the injection solution to come to room temperature before use. Do not warm it artificially. It is important that you put your used needles and syringes in a special sharps container. Do not put them in a trash can. If you do not have a sharps container, call your pharmacist or care team to get one. Talk to your care team about the use of this medication in children. Special care may be needed. Overdosage: If you think you have taken too much of this medicine  contact a poison control center or emergency room at once. NOTE: This medicine is only for you. Do not share this medicine with others. What if I miss a dose? If you miss a dose, take it as soon as you can. If it is almost time for your next dose, take only that dose. Do not take double or extra doses. What may interact with this medication? Bromocriptine Certain medications for blood pressure, heart disease, irregular heartbeat Cyclosporine Diuretics Medications for diabetes, including insulin Quinidine This list may not describe all possible interactions. Give your health care provider a list of all the medicines, herbs, non-prescription drugs, or dietary supplements you use. Also tell them if you smoke, drink alcohol, or use illegal drugs. Some items may interact with your medicine. What should I watch for while using this medication? Visit your care team for regular checks on your progress. Tell your care team if your symptoms do not start to get better or if they get worse. To help reduce irritation at the injection site, use a different site for each injection and make sure the solution is at room temperature before use. This medication may cause decreases in blood sugar. Signs of low blood sugar include chills, cool, pale skin or cold sweats, drowsiness, extreme hunger, fast heartbeat, headache, nausea, nervousness or anxiety, shakiness, trembling, unsteadiness, tiredness, or weakness. Contact your care team right away if you experience any of these symptoms. This medication may increase blood sugar. The risk may be higher in patients who already have diabetes. Ask your care team what you can  do to lower your risk of diabetes while taking this medication. You should make sure you get enough vitamin B12 while you are taking this medication. Discuss the foods you eat and the vitamins you take with your care team. What side effects may I notice from receiving this medication? Side effects that  you should report to your care team as soon as possible: Allergic reactions--skin rash, itching, hives, swelling of the face, lips, tongue, or throat Gallbladder problems--severe stomach pain, nausea, vomiting, fever Heart rhythm changes--fast or irregular heartbeat, dizziness, feeling faint or lightheaded, chest pain, trouble breathing High blood sugar (hyperglycemia)--increased thirst or amount of urine, unusual weakness or fatigue, blurry vision Low blood sugar (hypoglycemia)--tremors or shaking, anxiety, sweating, cold or clammy skin, confusion, dizziness, rapid heartbeat Low thyroid levels (hypothyroidism)--unusual weakness or fatigue, increased sensitivity to cold, constipation, hair loss, dry skin, weight gain, feelings of depression Low vitamin B12 level--pain, tingling, or numbness in the hands or feet, muscle weakness, dizziness, confusion, trouble concentrating Oily or light-colored stools, diarrhea, bloating, weight loss Pancreatitis--severe stomach pain that spreads to your back or gets worse after eating or when touched, fever, nausea, vomiting Slow heartbeat--dizziness, feeling faint or lightheaded, confusion, trouble breathing, unusual weakness or fatigue Side effects that usually do not require medical attention (report these to your care team if they continue or are bothersome): Diarrhea Dizziness Headache Nausea Pain, redness, or irritation at injection site Stomach pain This list may not describe all possible side effects. Call your doctor for medical advice about side effects. You may report side effects to FDA at 1-800-FDA-1088. Where should I keep my medication? Keep out of the reach of children and pets. Store in the refrigerator. Protect from light. Allow to come to room temperature naturally. Do not use artificial heat. If protected from light, the injection may be stored between 20 and 30 degrees C (70 and 86 degrees F) for 14 days. After the initial use, throw away  any unused portion of a multiple dose vial after 14 days. Get rid of any unused portions of the ampules after use. To get rid of medications that are no longer needed or have expired: Take the medication to a medication take-back program. Ask your pharmacy or law enforcement to find a location. If you cannot return the medication, ask your pharmacist or care team how to get rid of the medication safely. NOTE: This sheet is a summary. It may not cover all possible information. If you have questions about this medicine, talk to your doctor, pharmacist, or health care provider.  2024 Elsevier/Gold Standard (2023-01-30 00:00:00)

## 2023-07-16 ENCOUNTER — Other Ambulatory Visit: Payer: Self-pay | Admitting: Oncology

## 2023-07-16 DIAGNOSIS — C7B8 Other secondary neuroendocrine tumors: Secondary | ICD-10-CM

## 2023-07-17 ENCOUNTER — Encounter: Payer: Self-pay | Admitting: Oncology

## 2023-07-17 NOTE — Progress Notes (Signed)
 The proposed treatment discussed in conference is for discussion purpose only and is not a binding recommendation.  The patients have not been physically examined, or presented with their treatment options.  Therefore, final treatment plans cannot be decided.

## 2023-07-21 ENCOUNTER — Inpatient Hospital Stay

## 2023-07-21 ENCOUNTER — Inpatient Hospital Stay (HOSPITAL_BASED_OUTPATIENT_CLINIC_OR_DEPARTMENT_OTHER): Admitting: Oncology

## 2023-07-21 VITALS — BP 152/74 | HR 71 | Temp 98.0°F | Resp 16 | Ht 65.0 in | Wt 112.6 lb

## 2023-07-21 DIAGNOSIS — C7B8 Other secondary neuroendocrine tumors: Secondary | ICD-10-CM

## 2023-07-21 DIAGNOSIS — Z79899 Other long term (current) drug therapy: Secondary | ICD-10-CM | POA: Diagnosis not present

## 2023-07-21 DIAGNOSIS — N2889 Other specified disorders of kidney and ureter: Secondary | ICD-10-CM

## 2023-07-21 LAB — CMP (CANCER CENTER ONLY)
ALT: 12 U/L (ref 0–44)
AST: 19 U/L (ref 15–41)
Albumin: 4.4 g/dL (ref 3.5–5.0)
Alkaline Phosphatase: 78 U/L (ref 38–126)
Anion gap: 14 (ref 5–15)
BUN: 20 mg/dL (ref 8–23)
CO2: 23 mmol/L (ref 22–32)
Calcium: 9.8 mg/dL (ref 8.9–10.3)
Chloride: 100 mmol/L (ref 98–111)
Creatinine: 0.91 mg/dL (ref 0.44–1.00)
GFR, Estimated: >60 mL/min (ref >60)
Glucose, Bld: 84 mg/dL (ref 70–99)
Potassium: 4.5 mmol/L (ref 3.5–5.1)
Sodium: 137 mmol/L (ref 135–145)
Total Bilirubin: 0.3 mg/dL (ref 0.0–1.2)
Total Protein: 7.2 g/dL (ref 6.5–8.1)

## 2023-07-21 LAB — CBC WITH DIFFERENTIAL (CANCER CENTER ONLY)
Abs Immature Granulocytes: 0.02 10*3/uL (ref 0.00–0.07)
Basophils Absolute: 0 10*3/uL (ref 0.0–0.1)
Basophils Relative: 1 %
Eosinophils Absolute: 0.7 10*3/uL — ABNORMAL HIGH (ref 0.0–0.5)
Eosinophils Relative: 9 %
HCT: 31.3 % — ABNORMAL LOW (ref 36.0–46.0)
Hemoglobin: 10.1 g/dL — ABNORMAL LOW (ref 12.0–15.0)
Immature Granulocytes: 0 %
Lymphocytes Relative: 35 %
Lymphs Abs: 2.6 10*3/uL (ref 0.7–4.0)
MCH: 28.8 pg (ref 26.0–34.0)
MCHC: 32.3 g/dL (ref 30.0–36.0)
MCV: 89.2 fL (ref 80.0–100.0)
Monocytes Absolute: 0.6 10*3/uL (ref 0.1–1.0)
Monocytes Relative: 8 %
Neutro Abs: 3.5 10*3/uL (ref 1.7–7.7)
Neutrophils Relative %: 47 %
Platelet Count: 247 10*3/uL (ref 150–400)
RBC: 3.51 MIL/uL — ABNORMAL LOW (ref 3.87–5.11)
RDW: 14 % (ref 11.5–15.5)
WBC Count: 7.5 10*3/uL (ref 4.0–10.5)
nRBC: 0 % (ref 0.0–0.2)

## 2023-07-21 LAB — MAGNESIUM: Magnesium: 1.9 mg/dL (ref 1.7–2.4)

## 2023-07-21 LAB — LACTATE DEHYDROGENASE: LDH: 128 U/L (ref 98–192)

## 2023-07-21 NOTE — Progress Notes (Signed)
 [facility]  ONCOLOGY CLINIC PROGRESS NOTE   Patient Care Team: Roslyn Coombe, MD as PCP - General (Internal Medicine) Tessa Figures, MD as Consulting Physician (Gastroenterology) [place], Heather, OD (Optometry) Devon Fogo, MD (Inactive) as Consulting Physician (Dermatology)  PATIENT NAME: Hannah Madden   MR#: [id number] DOB: May 12, 1934  Date of visit: 07/21/2023   ASSESSMENT & PLAN:   Hannah Madden is a 89 y.o.  lady with a past medical history of GERD, glaucoma, dyslipidemia, hiatal hernia, alopecia, was referred to our clinic for newly diagnosed neuroendocrine tumor with multiple liver mets.  Also has renal mass, concerning for separate renal primary.   Metastatic malignant neuroendocrine tumor to liver [facility]) Please review oncology history for additional details and timeline of events.    CT abdomen and pelvis on 06/20/2023 showed multiple large heterogeneous liver lesions, largest measuring 10.6 cm in the right hepatic lobe and 5.7 cm in the left hepatic lobe.  Also noted was heterogeneous and has a mass in the superior pole of the right kidney measuring 3.5 cm, concerning for renal cell carcinoma.  Mesenteric mass or lymphadenopathy in the left central mesentery measuring 3.9 cm.  CT chest showed no evidence of metastatic disease in the chest.  Urology and interventional radiology was consulted.  Though she may have renal cell carcinoma, liver metastatic disease was not felt to be related as it is not common for renal cell carcinoma to metastasize to liver.  She may have a lesion in the bladder based on the CT imaging and hence outpatient cystoscopy and further workup was planned by urology.  On 06/22/2023, she underwent ultrasound-guided biopsy of the liver lesion.  Pathology showed well-differentiated neuroendocrine tumor, grade 1, Ki-67 index of 2%.  The neoplastic cells were diffusely and strongly positive for the neuroendocrine markers chromogranin  and synaptic ficin.  They were negative for cytokeratin 7 and cytokeratin 20.  The tumor exhibits focal patchy nonspecific positivity for PAX 8 which is of uncertain significance.    Previously I discussed diagnosis, prognosis, plan of care, treatment options.  Reviewed NCCN guidelines.  Stage IV disease because of extensive liver metastatic disease.  The tumor is well-differentiated, low-grade, with a Ki-67 of 2%, indicating slow growth.  She does not have any carcinoid symptoms.  Despite its indolent nature, the significant hepatic tumor burden necessitates treatment to prevent liver function compromise. The primary site is suspected to be the gut lining but is not definitively identified. The tumor is not expected to significantly affect life expectancy. Treatment aims to control disease progression and prevent further spread, as a complete cure is not feasible due to extensive liver involvement.  On her initial consultation with us  on 06/30/2023, chromogranin A was elevated at 1495 ng/mL.  Request submitted for PET dotatate scan.    On 07/09/2023, PET dotatate scan showed bilobar tracer avid liver metastases, large lesion measuring 10.7 x 8.5 cm in the right lobe, 6.4 x 3.8 cm in the left lobe.  Multiple tracer avid lymph nodes identified within the small bowel mesentery, compatible with nodal metastasis.  Focal area of increased radiotracer uptake localized to the loop of small bowel within the left lower quadrant, likely small bowel primary.  No uptake noted in the right kidney lesion.   Patient does not have carcinoid symptoms.  However given the burden of disease, decision was made to proceed with octreotide  injections, which she started from 07/15/2023.  Plan to continue monthly.   She had urology  evaluation for the kidney mass.  She was not deemed to be a surgical candidate.  Cystoscopy showed no bladder tumor.  Surveillance versus IR evaluation for possible ablation was recommended.  Referral sent  to IR Dr. Mabel Savage and I also discussed her case with him today over the phone.  Right kidney mass Tumor located at the upper pole of the right kidney, measuring approximately 3.9 cm. Imaging suggests renal carcinoma, but further evaluation is required. Urology will assess the possibility of surgical resection versus ablation.  She does have an appointment coming up with their office next week.    The tumor is distinct from the neuroendocrine tumor and is suspected to be primary renal cancer. Surgical resection is preferred if feasible to obtain tissue for definitive diagnosis and to assess the grade of the tumor.   She had urology evaluation for the kidney mass.  She was not deemed to be a surgical candidate.  Cystoscopy showed no bladder tumor.  Surveillance versus IR evaluation for possible ablation was recommended.  Referral sent to IR Dr. Mabel Savage.    I reviewed lab results and outside records for this visit and discussed relevant results with the patient. Diagnosis, plan of care and treatment options were also discussed in detail with the patient. Opportunity provided to ask questions and answers provided to her apparent satisfaction. Provided instructions to call our clinic with any problems, questions or concerns prior to return visit. I recommended to continue follow-up with PCP and sub-specialists. She verbalized understanding and agreed with the plan.   NCCN guidelines have been consulted in the planning of this patient's care.  I spent a total of 40 minutes during this encounter with the patient including review of chart and various tests results, discussions about plan of care and coordination of care plan.   Tiran Sauseda, MD   [facility] - A DEPT OF [facility] [id number] [place] [address] Dept: [phone] Dept Fax: [phone]    CHIEF COMPLAINT/ REASON FOR VISIT:   Well-differentiated  neuroendocrine tumor, grade 1, with extensive liver metastatic disease, presumed to be small bowel primary.  Right kidney mass, likely separate primary.  Current Treatment: Monthly octreotide  injections started from 07/15/2023.  INTERVAL HISTORY:    Discussed the use of AI scribe software for clinical note transcription with the patient, who gave verbal consent to proceed.  History of Present Illness Hannah Madden is an 89 year old female with neuroendocrine tumors who presents for follow-up regarding her recent PET scan and treatment plan. She is accompanied by her sister.  She has a history of neuroendocrine tumors primarily affecting the liver and lymph nodes in the abdomen. A recent PET scan was performed to evaluate the extent of her disease. The scan showed known disease in the liver and lymph nodes, but the previously noted kidney spot did not show activity. She has consulted with a urologist regarding the kidney issue, and there was some confusion about a referral for ablation, as the radiologist's office had not received it.  She received her first injection last Wednesday and is doing well with it. Her chromogranin A level was significantly elevated at 1495, with normal levels being up to 100. This marker will be monitored in the future. She has an upcoming appointment on June 10th for further evaluation.  She reports improvement in diarrhea symptoms, describing her bowel movements as 'loose stool' rather than diarrhea. She has not had problems  with bowel movements in the past. She also experiences bloating, which she attributes to liver issues, and has a history of acid reflux since her teens. No current abdominal pain, though she experienced some discomfort previously.   I have reviewed the past medical history, past surgical history, social history and family history with the patient and they are unchanged from previous note.  HISTORY OF PRESENT ILLNESS:   ONCOLOGY HISTORY:    Patient presented to the ED with complaints of worsening abdominal pain, back pain and near syncope on 06/19/2023 and was hospitalized for further evaluation.   CT abdomen and pelvis on 06/20/2023 showed multiple large heterogeneous liver lesions, largest measuring 10.6 cm in the right hepatic lobe and 5.7 cm in the left hepatic lobe.  Also noted was heterogeneous and has a mass in the superior pole of the right kidney measuring 3.5 cm, concerning for renal cell carcinoma.  Mesenteric mass or lymphadenopathy in the left central mesentery measuring 3.9 cm.  CT chest showed no evidence of metastatic disease in the chest.   Urology and interventional radiology was consulted.  Though she may have renal cell carcinoma, liver metastatic disease was not felt to be related as it is not common for renal cell carcinoma to metastasize to liver.  She may have a lesion in the bladder based on the CT imaging and hence outpatient cystoscopy and further workup was planned by urology.   On 06/22/2023, she underwent ultrasound-guided biopsy of the liver lesion.  Pathology showed well-differentiated neuroendocrine tumor, grade 1, Ki-67 index of 2%.  The neoplastic cells were diffusely and strongly positive for the neuroendocrine markers chromogranin and synaptic ficin.  They were negative for cytokeratin 7 and cytokeratin 20.  The tumor exhibits focal patchy nonspecific positivity for PAX 8 which is of uncertain significance.     On her initial consultation with us  on 06/30/2023, chromogranin A was elevated at 1495 ng/mL.  Request submitted for PET dotatate scan.    On 07/09/2023, PET dotatate scan showed bilobar tracer avid liver metastases, large lesion measuring 10.7 x 8.5 cm in the right lobe, 6.4 x 3.8 cm in the left lobe.  Multiple tracer avid lymph nodes identified within the small bowel mesentery, compatible with nodal metastasis.  Focal area of increased radiotracer uptake localized to the loop of small bowel within  the left lower quadrant, likely small bowel primary.  No uptake noted in the right kidney lesion.   Patient does not have carcinoid symptoms.  However given the burden of disease, decision was made to proceed with octreotide  injections, which she started from 07/15/2023.  Plan to continue monthly.   She had urology evaluation for the kidney mass.  She was not deemed to be a surgical candidate.  Cystoscopy showed no bladder tumor.  Surveillance versus IR evaluation for possible ablation was recommended.  Referral sent to IR Dr. Mabel Savage.   REVIEW OF SYSTEMS:   Review of Systems - Oncology  All other pertinent systems were reviewed with the patient and are negative.  ALLERGIES: She is allergic to atorvastatin, macrobid [nitrofurantoin], penicillins, and ciprofloxacin .  MEDICATIONS:  Current Outpatient Medications  Medication Sig Dispense Refill   brimonidine -timolol  (COMBIGAN ) 0.2-0.5 % ophthalmic solution Place 1 drop into the right eye 2 (two) times daily.     brinzolamide  (AZOPT ) 1 % ophthalmic suspension Place 1 drop into the right eye 2 (two) times daily.     ondansetron  (ZOFRAN ) 4 MG tablet Take 1 tablet (4 mg total) by mouth every  8 (eight) hours as needed for nausea or vomiting. 90 tablet 3   pantoprazole  (PROTONIX ) 40 MG tablet Take 1 tablet (40 mg total) by mouth daily. 90 tablet 3   tobramycin (TOBREX) 0.3 % ophthalmic solution Place 1 drop into the right eye as needed (before eye injection).     docusate sodium  (COLACE) 100 MG capsule Take 1 capsule (100 mg total) by mouth 2 (two) times daily. (Patient not taking: Reported on 07/21/2023) 10 capsule 0   No current facility-administered medications for this visit.     VITALS:   Blood pressure (!) 152/74, pulse 71, temperature 98 F (36.7 C), temperature source Oral, resp. rate 16, height 5\' 5"  (1.651 m), weight 112 lb 9.6 oz (51.1 kg), SpO2 100%.  Wt Readings from Last 3 Encounters:  07/21/23 112 lb 9.6 oz (51.1 kg)  06/30/23  113 lb 12.8 oz (51.6 kg)  06/30/23 112 lb 3.2 oz (50.9 kg)    Body mass index is 18.74 kg/m.     Onc Performance Status - 07/21/23 1547       ECOG Perf Status   ECOG Perf Status Ambulatory and capable of all selfcare but unable to carry out any work activities.  Up and about more than 50% of waking hours      KPS SCALE   KPS % SCORE Cares for self, unable to carry on normal activity or to do active work             PHYSICAL EXAM:   Physical Exam Constitutional:      General: She is not in acute distress.    Appearance: Normal appearance.  HENT:     Head: Normocephalic and atraumatic.  Cardiovascular:     Rate and Rhythm: Normal rate.  Pulmonary:     Effort: Pulmonary effort is normal. No respiratory distress.  Abdominal:     General: There is no distension.  Neurological:     General: No focal deficit present.     Mental Status: She is alert and oriented to person, place, and time.  Psychiatric:        Mood and Affect: Mood normal.        Behavior: Behavior normal.        LABORATORY DATA:   I have reviewed the data as listed.  Results for orders placed or performed in visit on 07/21/23  Lactate dehydrogenase  Result Value Ref Range   LDH 128 98 - 192 U/L  Magnesium   Result Value Ref Range   Magnesium  1.9 1.7 - 2.4 mg/dL  CMP (Cancer Center only)  Result Value Ref Range   Sodium 137 135 - 145 mmol/L   Potassium 4.5 3.5 - 5.1 mmol/L   Chloride 100 98 - 111 mmol/L   CO2 23 22 - 32 mmol/L   Glucose, Bld 84 70 - 99 mg/dL   BUN 20 8 - 23 mg/dL   Creatinine 1.61 0.96 - 1.00 mg/dL   Calcium  9.8 8.9 - 10.3 mg/dL   Total Protein 7.2 6.5 - 8.1 g/dL   Albumin 4.4 3.5 - 5.0 g/dL   AST 19 15 - 41 U/L   ALT 12 0 - 44 U/L   Alkaline Phosphatase 78 38 - 126 U/L   Total Bilirubin 0.3 0.0 - 1.2 mg/dL   GFR, Estimated >04 >54 mL/min   Anion gap 14 5 - 15  CBC with Differential (Cancer Center Only)  Result Value Ref Range   WBC Count 7.5 4.0 - 10.5 K/uL  RBC 3.51 (L) 3.87 - 5.11 MIL/uL   Hemoglobin 10.1 (L) 12.0 - 15.0 g/dL   HCT 40.9 (L) 81.1 - 91.4 %   MCV 89.2 80.0 - 100.0 fL   MCH 28.8 26.0 - 34.0 pg   MCHC 32.3 30.0 - 36.0 g/dL   RDW 78.2 95.6 - 21.3 %   Platelet Count 247 150 - 400 K/uL   nRBC 0.0 0.0 - 0.2 %   Neutrophils Relative % 47 %   Neutro Abs 3.5 1.7 - 7.7 K/uL   Lymphocytes Relative 35 %   Lymphs Abs 2.6 0.7 - 4.0 K/uL   Monocytes Relative 8 %   Monocytes Absolute 0.6 0.1 - 1.0 K/uL   Eosinophils Relative 9 %   Eosinophils Absolute 0.7 (H) 0.0 - 0.5 K/uL   Basophils Relative 1 %   Basophils Absolute 0.0 0.0 - 0.1 K/uL   Immature Granulocytes 0 %   Abs Immature Granulocytes 0.02 0.00 - 0.07 K/uL      RADIOGRAPHIC STUDIES:  I have personally reviewed the radiological images as listed and agree with the findings in the report.  NM PET DOTATATE SKULL BASE TO MID THIGH Result Date: 07/13/2023 CLINICAL DATA:  New diagnosis of neuroendocrine tumor with liver metastasis of unknown primary. EXAM: NUCLEAR MEDICINE PET SKULL BASE TO THIGH TECHNIQUE: 3.84 mCi Cu 64 DOTATATE was injected intravenously. Full-ring PET imaging was performed from the skull base to thigh after the radiotracer. CT data was obtained and used for attenuation correction and anatomic localization. COMPARISON:  CT from 06/20/2023 FINDINGS: NECK No radiotracer activity in neck lymph nodes. Incidental CT findings: Low-attenuation lesions in the thyroid  gland are again noted which measure up to 1.4 cm. No follow-up imaging recommended (ref: J Am Coll Radiol. 2015 Feb;12(2): 143-50). CHEST No radiotracer accumulation within mediastinal or hilar lymph nodes. No suspicious pulmonary nodules on the CT scan. Incidental CT finding:Aortic atherosclerosis and coronary artery calcifications. No pericardial effusion. ABDOMEN/PELVIS BILOBAR TRACER AVID LIVER METASTASES ARE IDENTIFIED.  FOR EXAMPLE: BILOBAR TRACER AVID LIVER METASTASES ARE IDENTIFIED.  FOR EXAMPLE -Index  lesion within the lateral segment of left lobe measures 6.4 x 3.8 cm with SUV max of 29.2. -Index lesion within the dome of right lobe measures 10.7 x 8.5 cm with SUV max of 26.6. -Partially calcified, hyperdense lesion within segment 4 B measures 2.8 x 2.8 cm with SUV max of 22.9. Multiple tracer avid lymph nodes are identified, including: -within the left hemiabdomen there is a jejunal mesenteric node which measures 3.0 x 4.0 cm with SUV max of 34.8. -within the lower, left paramidline small bowel mesentery there is a lymph node which measures 1 cm with SUV max of 9.8. Within the left lower quadrant of the abdomen there is a focal area of increased radiotracer uptake localizing to a loop of small bowel which measures approximately 1.3 cm with SUV max of 11.4. Physiologic activity noted in the liver, spleen, adrenal glands and kidneys. Incidental CT findings:Sub optimal visualization of enhancing lesion within upper pole of right kidney reflecting lack of IV contrast material. Aortic atherosclerotic calcifications. SKELETON No focal activity to suggest skeletal metastasis. Incidental CT findings:None IMPRESSION: 1. Bilobar tracer avid liver metastases are identified. 2. Multiple tracer avid lymph nodes are identified within the small bowel mesentery compatible with nodal metastasis. 3. There is a focal area of increased radiotracer uptake localizing to a loop of small bowel within the left lower quadrant of the abdomen which measures approximately 1.3 cm with SUV max  of 11.4. This may reflect small bowel primary. Consider further evaluation with contrast enhanced CT enterography. 4. Sub optimal visualization of enhancing lesion within upper pole of right kidney reflecting lack of IV contrast material. 5. Coronary artery calcifications. 6.  Aortic Atherosclerosis (ICD10-I70.0). Electronically Signed   By: Kimberley Penman M.D.   On: 07/13/2023 15:25     CODE STATUS:  Code Status History     Date Active Date  Inactive Code Status Order ID Comments User Context   06/20/2023 0855 06/23/2023 1921 Full Code [id number]  Danice Dural, MD Inpatient   06/20/2023 0855 06/20/2023 0855 Full Code [id number]  Danice Dural, MD Inpatient   09/12/2013 1422 09/13/2013 1539 Full Code [id number]  Jasmine Mesi, MD Inpatient    Questions for Most Recent Historical Code Status (Order [id number])     Question Answer   By: Consent: discussion documented in EHR            No orders of the defined types were placed in this encounter.    Future Appointments  Date Time Provider Department Center  08/11/2023  2:15 PM [facility] PHLEBOTOMIST [facility] None  08/11/2023  2:45 PM Drury Ardizzone, Gale Jude, MD [facility] None  08/11/2023  3:00 PM [facility] INFUSION [facility] None  08/24/2023  1:40 PM Roslyn Coombe, MD [facility] None     This document was completed utilizing speech recognition software. Grammatical errors, random word insertions, pronoun errors, and incomplete sentences are an occasional consequence of this system due to software limitations, ambient noise, and hardware issues. Any formal questions or concerns about the content, text or information contained within the body of this dictation should be directly addressed to the provider for clarification.

## 2023-07-22 ENCOUNTER — Encounter: Payer: Self-pay | Admitting: Oncology

## 2023-07-22 NOTE — Assessment & Plan Note (Addendum)
 Please review oncology history for additional details and timeline of events.    CT abdomen and pelvis on 06/20/2023 showed multiple large heterogeneous liver lesions, largest measuring 10.6 cm in the right hepatic lobe and 5.7 cm in the left hepatic lobe.  Also noted was heterogeneous and has a mass in the superior pole of the right kidney measuring 3.5 cm, concerning for renal cell carcinoma.  Mesenteric mass or lymphadenopathy in the left central mesentery measuring 3.9 cm.  CT chest showed no evidence of metastatic disease in the chest.  Urology and interventional radiology was consulted.  Though she may have renal cell carcinoma, liver metastatic disease was not felt to be related as it is not common for renal cell carcinoma to metastasize to liver.  She may have a lesion in the bladder based on the CT imaging and hence outpatient cystoscopy and further workup was planned by urology.  On 06/22/2023, she underwent ultrasound-guided biopsy of the liver lesion.  Pathology showed well-differentiated neuroendocrine tumor, grade 1, Ki-67 index of 2%.  The neoplastic cells were diffusely and strongly positive for the neuroendocrine markers chromogranin and synaptic ficin.  They were negative for cytokeratin 7 and cytokeratin 20.  The tumor exhibits focal patchy nonspecific positivity for PAX 8 which is of uncertain significance.    Previously I discussed diagnosis, prognosis, plan of care, treatment options.  Reviewed NCCN guidelines.  Stage IV disease because of extensive liver metastatic disease.  The tumor is well-differentiated, low-grade, with a Ki-67 of 2%, indicating slow growth.  She does not have any carcinoid symptoms.  Despite its indolent nature, the significant hepatic tumor burden necessitates treatment to prevent liver function compromise. The primary site is suspected to be the gut lining but is not definitively identified. The tumor is not expected to significantly affect life expectancy.  Treatment aims to control disease progression and prevent further spread, as a complete cure is not feasible due to extensive liver involvement.  On her initial consultation with us  on 06/30/2023, chromogranin A was elevated at 1495 ng/mL.  Request submitted for PET dotatate scan.    On 07/09/2023, PET dotatate scan showed bilobar tracer avid liver metastases, large lesion measuring 10.7 x 8.5 cm in the right lobe, 6.4 x 3.8 cm in the left lobe.  Multiple tracer avid lymph nodes identified within the small bowel mesentery, compatible with nodal metastasis.  Focal area of increased radiotracer uptake localized to the loop of small bowel within the left lower quadrant, likely small bowel primary.  No uptake noted in the right kidney lesion.   Patient does not have carcinoid symptoms.  However given the burden of disease, decision was made to proceed with octreotide  injections, which she started from 07/15/2023.  Plan to continue monthly.   She had urology evaluation for the kidney mass.  She was not deemed to be a surgical candidate.  Cystoscopy showed no bladder tumor.  Surveillance versus IR evaluation for possible ablation was recommended.  Referral sent to IR Dr. Mabel Savage and I also discussed her case with him today over the phone.  Schedule follow-up scan in 3-4 months to assess tumor response.  I will see her on 08/11/2023, when she is due for her next injection.

## 2023-07-22 NOTE — Assessment & Plan Note (Signed)
 Tumor located at the upper pole of the right kidney, measuring approximately 3.9 cm. Imaging suggests renal carcinoma, but further evaluation is required. Urology will assess the possibility of surgical resection versus ablation.  She does have an appointment coming up with their office next week.    The tumor is distinct from the neuroendocrine tumor and is suspected to be primary renal cancer. Surgical resection is preferred if feasible to obtain tissue for definitive diagnosis and to assess the grade of the tumor.   She had urology evaluation for the kidney mass.  She was not deemed to be a surgical candidate.  Cystoscopy showed no bladder tumor.  Surveillance versus IR evaluation for possible ablation was recommended.  Referral sent to IR Dr. Mabel Savage.

## 2023-08-04 ENCOUNTER — Encounter: Payer: Self-pay | Admitting: Oncology

## 2023-08-05 ENCOUNTER — Other Ambulatory Visit: Payer: Self-pay | Admitting: Oncology

## 2023-08-05 DIAGNOSIS — C7B8 Other secondary neuroendocrine tumors: Secondary | ICD-10-CM

## 2023-08-07 DIAGNOSIS — H401110 Primary open-angle glaucoma, right eye, stage unspecified: Secondary | ICD-10-CM | POA: Diagnosis not present

## 2023-08-07 DIAGNOSIS — H35371 Puckering of macula, right eye: Secondary | ICD-10-CM | POA: Diagnosis not present

## 2023-08-07 DIAGNOSIS — H43813 Vitreous degeneration, bilateral: Secondary | ICD-10-CM | POA: Diagnosis not present

## 2023-08-07 DIAGNOSIS — H34811 Central retinal vein occlusion, right eye, with macular edema: Secondary | ICD-10-CM | POA: Diagnosis not present

## 2023-08-11 ENCOUNTER — Inpatient Hospital Stay (HOSPITAL_BASED_OUTPATIENT_CLINIC_OR_DEPARTMENT_OTHER): Admitting: Oncology

## 2023-08-11 ENCOUNTER — Inpatient Hospital Stay: Attending: Oncology

## 2023-08-11 ENCOUNTER — Inpatient Hospital Stay

## 2023-08-11 VITALS — BP 124/76 | HR 66 | Temp 97.7°F | Resp 17 | Wt 113.0 lb

## 2023-08-11 DIAGNOSIS — N2889 Other specified disorders of kidney and ureter: Secondary | ICD-10-CM

## 2023-08-11 DIAGNOSIS — Z79899 Other long term (current) drug therapy: Secondary | ICD-10-CM | POA: Insufficient documentation

## 2023-08-11 DIAGNOSIS — C7B8 Other secondary neuroendocrine tumors: Secondary | ICD-10-CM | POA: Diagnosis present

## 2023-08-11 DIAGNOSIS — E785 Hyperlipidemia, unspecified: Secondary | ICD-10-CM | POA: Diagnosis not present

## 2023-08-11 DIAGNOSIS — K219 Gastro-esophageal reflux disease without esophagitis: Secondary | ICD-10-CM | POA: Insufficient documentation

## 2023-08-11 DIAGNOSIS — H409 Unspecified glaucoma: Secondary | ICD-10-CM | POA: Diagnosis not present

## 2023-08-11 LAB — CBC WITH DIFFERENTIAL (CANCER CENTER ONLY)
Abs Immature Granulocytes: 0.03 10*3/uL (ref 0.00–0.07)
Basophils Absolute: 0.1 10*3/uL (ref 0.0–0.1)
Basophils Relative: 1 %
Eosinophils Absolute: 1.2 10*3/uL — ABNORMAL HIGH (ref 0.0–0.5)
Eosinophils Relative: 14 %
HCT: 30.9 % — ABNORMAL LOW (ref 36.0–46.0)
Hemoglobin: 10 g/dL — ABNORMAL LOW (ref 12.0–15.0)
Immature Granulocytes: 0 %
Lymphocytes Relative: 33 %
Lymphs Abs: 2.9 10*3/uL (ref 0.7–4.0)
MCH: 29.2 pg (ref 26.0–34.0)
MCHC: 32.4 g/dL (ref 30.0–36.0)
MCV: 90.4 fL (ref 80.0–100.0)
Monocytes Absolute: 0.7 10*3/uL (ref 0.1–1.0)
Monocytes Relative: 8 %
Neutro Abs: 3.8 10*3/uL (ref 1.7–7.7)
Neutrophils Relative %: 44 %
Platelet Count: 229 10*3/uL (ref 150–400)
RBC: 3.42 MIL/uL — ABNORMAL LOW (ref 3.87–5.11)
RDW: 13.9 % (ref 11.5–15.5)
WBC Count: 8.6 10*3/uL (ref 4.0–10.5)
nRBC: 0 % (ref 0.0–0.2)

## 2023-08-11 LAB — CMP (CANCER CENTER ONLY)
ALT: 10 U/L (ref 0–44)
AST: 21 U/L (ref 15–41)
Albumin: 4.3 g/dL (ref 3.5–5.0)
Alkaline Phosphatase: 98 U/L (ref 38–126)
Anion gap: 13 (ref 5–15)
BUN: 17 mg/dL (ref 8–23)
CO2: 22 mmol/L (ref 22–32)
Calcium: 9.7 mg/dL (ref 8.9–10.3)
Chloride: 101 mmol/L (ref 98–111)
Creatinine: 0.85 mg/dL (ref 0.44–1.00)
GFR, Estimated: >60 mL/min (ref >60)
Glucose, Bld: 92 mg/dL (ref 70–99)
Potassium: 4.9 mmol/L (ref 3.5–5.1)
Sodium: 135 mmol/L (ref 135–145)
Total Bilirubin: 0.2 mg/dL (ref 0.0–1.2)
Total Protein: 7.2 g/dL (ref 6.5–8.1)

## 2023-08-11 MED ORDER — OCTREOTIDE ACETATE 20 MG IM KIT
20.0000 mg | PACK | Freq: Once | INTRAMUSCULAR | Status: AC
  Administered 2023-08-11: 20 mg via INTRAMUSCULAR

## 2023-08-11 NOTE — Patient Instructions (Signed)
 Octreotide Injection Solution What is this medication? OCTREOTIDE (ok TREE oh tide) treats high levels of growth hormone (acromegaly). It works by reducing the amount of growth hormone your body makes. This reduces symptoms and the risk of health problems caused by too much growth hormone, such as diabetes and heart disease. It may also be used to treat diarrhea caused by neuroendocrine tumors. It works by slowing down the release of serotonin from the tumor cells. This reduces the number of bowel movements you have. This medicine may be used for other purposes; ask your health care provider or pharmacist if you have questions. COMMON BRAND NAME(S): Berline Lopes, Sandostatin What should I tell my care team before I take this medication? They need to know if you have any of these conditions: Diabetes Gallbladder disease Heart disease Kidney disease Liver disease Pancreatic disease Thyroid disease An unusual or allergic reaction to octreotide, other medications, foods, dyes, or preservatives Pregnant or trying to get pregnant Breastfeeding How should I use this medication? This medication is injected under the skin or into a vein. It is usually given by your care team in a hospital or clinic setting. If you get this medication at home, you will be taught how to prepare and give it. Use exactly as directed. Take it as directed on the prescription label at the same time every day. Keep taking it unless your care team tells you to stop. Allow the injection solution to come to room temperature before use. Do not warm it artificially. It is important that you put your used needles and syringes in a special sharps container. Do not put them in a trash can. If you do not have a sharps container, call your pharmacist or care team to get one. Talk to your care team about the use of this medication in children. Special care may be needed. Overdosage: If you think you have taken too much of this medicine  contact a poison control center or emergency room at once. NOTE: This medicine is only for you. Do not share this medicine with others. What if I miss a dose? If you miss a dose, take it as soon as you can. If it is almost time for your next dose, take only that dose. Do not take double or extra doses. What may interact with this medication? Bromocriptine Certain medications for blood pressure, heart disease, irregular heartbeat Cyclosporine Diuretics Medications for diabetes, including insulin Quinidine This list may not describe all possible interactions. Give your health care provider a list of all the medicines, herbs, non-prescription drugs, or dietary supplements you use. Also tell them if you smoke, drink alcohol, or use illegal drugs. Some items may interact with your medicine. What should I watch for while using this medication? Visit your care team for regular checks on your progress. Tell your care team if your symptoms do not start to get better or if they get worse. To help reduce irritation at the injection site, use a different site for each injection and make sure the solution is at room temperature before use. This medication may cause decreases in blood sugar. Signs of low blood sugar include chills, cool, pale skin or cold sweats, drowsiness, extreme hunger, fast heartbeat, headache, nausea, nervousness or anxiety, shakiness, trembling, unsteadiness, tiredness, or weakness. Contact your care team right away if you experience any of these symptoms. This medication may increase blood sugar. The risk may be higher in patients who already have diabetes. Ask your care team what you can  do to lower your risk of diabetes while taking this medication. You should make sure you get enough vitamin B12 while you are taking this medication. Discuss the foods you eat and the vitamins you take with your care team. What side effects may I notice from receiving this medication? Side effects that  you should report to your care team as soon as possible: Allergic reactions--skin rash, itching, hives, swelling of the face, lips, tongue, or throat Gallbladder problems--severe stomach pain, nausea, vomiting, fever Heart rhythm changes--fast or irregular heartbeat, dizziness, feeling faint or lightheaded, chest pain, trouble breathing High blood sugar (hyperglycemia)--increased thirst or amount of urine, unusual weakness or fatigue, blurry vision Low blood sugar (hypoglycemia)--tremors or shaking, anxiety, sweating, cold or clammy skin, confusion, dizziness, rapid heartbeat Low thyroid levels (hypothyroidism)--unusual weakness or fatigue, increased sensitivity to cold, constipation, hair loss, dry skin, weight gain, feelings of depression Low vitamin B12 level--pain, tingling, or numbness in the hands or feet, muscle weakness, dizziness, confusion, trouble concentrating Oily or light-colored stools, diarrhea, bloating, weight loss Pancreatitis--severe stomach pain that spreads to your back or gets worse after eating or when touched, fever, nausea, vomiting Slow heartbeat--dizziness, feeling faint or lightheaded, confusion, trouble breathing, unusual weakness or fatigue Side effects that usually do not require medical attention (report these to your care team if they continue or are bothersome): Diarrhea Dizziness Headache Nausea Pain, redness, or irritation at injection site Stomach pain This list may not describe all possible side effects. Call your doctor for medical advice about side effects. You may report side effects to FDA at 1-800-FDA-1088. Where should I keep my medication? Keep out of the reach of children and pets. Store in the refrigerator. Protect from light. Allow to come to room temperature naturally. Do not use artificial heat. If protected from light, the injection may be stored between 20 and 30 degrees C (70 and 86 degrees F) for 14 days. After the initial use, throw away  any unused portion of a multiple dose vial after 14 days. Get rid of any unused portions of the ampules after use. To get rid of medications that are no longer needed or have expired: Take the medication to a medication take-back program. Ask your pharmacy or law enforcement to find a location. If you cannot return the medication, ask your pharmacist or care team how to get rid of the medication safely. NOTE: This sheet is a summary. It may not cover all possible information. If you have questions about this medicine, talk to your doctor, pharmacist, or health care provider.  2024 Elsevier/Gold Standard (2023-01-30 00:00:00)

## 2023-08-11 NOTE — Progress Notes (Unsigned)
 Patient seen by Dr. Charleston Conrad are within treatment parameters.  Labs reviewed: and are within treatment parameters.  Per physician team, patient is ready for treatment and there are NO modifications to the treatment plan.

## 2023-08-11 NOTE — Assessment & Plan Note (Addendum)
 Please review oncology history for additional details and timeline of events.    CT abdomen and pelvis on 06/20/2023 showed multiple large heterogeneous liver lesions, largest measuring 10.6 cm in the right hepatic lobe and 5.7 cm in the left hepatic lobe.  Also noted was heterogeneous and has a mass in the superior pole of the right kidney measuring 3.5 cm, concerning for renal cell carcinoma.  Mesenteric mass or lymphadenopathy in the left central mesentery measuring 3.9 cm.  CT chest showed no evidence of metastatic disease in the chest.  On 06/22/2023, she underwent ultrasound-guided biopsy of the liver lesion.  Pathology showed well-differentiated neuroendocrine tumor, grade 1, Ki-67 index of 2%.  The neoplastic cells were diffusely and strongly positive for the neuroendocrine markers chromogranin and synaptic ficin.  They were negative for cytokeratin 7 and cytokeratin 20.  The tumor exhibits focal patchy nonspecific positivity for PAX 8 which is of uncertain significance.    Previously I discussed diagnosis, prognosis, plan of care, treatment options.  Reviewed NCCN guidelines.  Stage IV disease because of extensive liver metastatic disease.  The tumor is well-differentiated, low-grade, with a Ki-67 of 2%, indicating slow growth.  She does not have any carcinoid symptoms.  Despite its indolent nature, the significant hepatic tumor burden necessitates treatment to prevent liver function compromise. The primary site is suspected to be the gut lining but is not definitively identified. The tumor is not expected to significantly affect life expectancy. Treatment aims to control disease progression and prevent further spread, as a complete cure is not feasible due to extensive liver involvement.  On her initial consultation with us  on 06/30/2023, chromogranin A was elevated at 1495 ng/mL.  Request submitted for PET dotatate scan.    On 07/09/2023, PET dotatate scan showed bilobar tracer avid liver  metastases, large lesion measuring 10.7 x 8.5 cm in the right lobe, 6.4 x 3.8 cm in the left lobe.  Multiple tracer avid lymph nodes identified within the small bowel mesentery, compatible with nodal metastasis.  Focal area of increased radiotracer uptake localized to the loop of small bowel within the left lower quadrant, likely small bowel primary.  No uptake noted in the right kidney lesion.   Patient does not have carcinoid symptoms.  However given the burden of disease, decision was made to proceed with octreotide  injections, which she started from 07/15/2023.  She tolerated the injections well.  Dose given today.  Plan to continue monthly.   She had urology evaluation for the kidney mass.  She was not deemed to be a surgical candidate.  Cystoscopy showed no bladder tumor.  Surveillance versus IR evaluation for possible ablation was recommended.  Referral sent to IR Dr. Mabel Savage and I also discussed her case with him previously over the phone.  Schedule follow-up scan in 3-4 months to assess tumor response.  RTC in 4 weeks for labs, office visit and octreotide  injection.

## 2023-08-11 NOTE — Assessment & Plan Note (Signed)
 Tumor located at the upper pole of the right kidney, measuring approximately 3.9 cm. Imaging suggests renal carcinoma, but further evaluation is required. Urology will assess the possibility of surgical resection versus ablation.  She does have an appointment coming up with their office next week.    The tumor is distinct from the neuroendocrine tumor and is suspected to be primary renal cancer. Surgical resection is preferred if feasible to obtain tissue for definitive diagnosis and to assess the grade of the tumor.   She had urology evaluation for the kidney mass.  She was not deemed to be a surgical candidate.  Cystoscopy showed no bladder tumor.  Surveillance versus IR evaluation for possible ablation was recommended.  Referral sent to IR Dr. Mabel Savage.

## 2023-08-11 NOTE — Progress Notes (Unsigned)
 [facility]  ONCOLOGY CLINIC PROGRESS NOTE   Patient Care Team: Roslyn Coombe, MD as PCP - General (Internal Medicine) Tessa Figures, MD as Consulting Physician (Gastroenterology) [place], Heather, OD (Optometry) Devon Fogo, MD (Inactive) as Consulting Physician (Dermatology)  PATIENT NAME: Hannah Madden   MR#: [id number] DOB: 11/17/1934  Date of visit: 08/11/2023   ASSESSMENT & PLAN:   YAMILE ROEDL is a 89 y.o.  lady with a past medical history of GERD, glaucoma, dyslipidemia, hiatal hernia, alopecia, was referred to our clinic for newly diagnosed neuroendocrine tumor with multiple liver mets.  Also has renal mass, concerning for separate renal primary.   Metastatic malignant neuroendocrine tumor to liver [facility]) Please review oncology history for additional details and timeline of events.    CT abdomen and pelvis on 06/20/2023 showed multiple large heterogeneous liver lesions, largest measuring 10.6 cm in the right hepatic lobe and 5.7 cm in the left hepatic lobe.  Also noted was heterogeneous and has a mass in the superior pole of the right kidney measuring 3.5 cm, concerning for renal cell carcinoma.  Mesenteric mass or lymphadenopathy in the left central mesentery measuring 3.9 cm.  CT chest showed no evidence of metastatic disease in the chest.  On 06/22/2023, Hannah Madden underwent ultrasound-guided biopsy of the liver lesion.  Pathology showed well-differentiated neuroendocrine tumor, grade 1, Ki-67 index of 2%.  The neoplastic cells were diffusely and strongly positive for the neuroendocrine markers chromogranin and synaptic ficin.  They were negative for cytokeratin 7 and cytokeratin 20.  The tumor exhibits focal patchy nonspecific positivity for PAX 8 which is of uncertain significance.    Previously I discussed diagnosis, prognosis, plan of care, treatment options.  Reviewed NCCN guidelines.  Stage IV disease because of extensive liver metastatic  disease.  The tumor is well-differentiated, low-grade, with a Ki-67 of 2%, indicating slow growth.  Hannah Madden does not have any carcinoid symptoms.  Despite its indolent nature, the significant hepatic tumor burden necessitates treatment to prevent liver function compromise. The primary site is suspected to be the gut lining but is not definitively identified. The tumor is not expected to significantly affect life expectancy. Treatment aims to control disease progression and prevent further spread, as a complete cure is not feasible due to extensive liver involvement.  On her initial consultation with us  on 06/30/2023, chromogranin A was elevated at 1495 ng/mL.  Request submitted for PET dotatate scan.    On 07/09/2023, PET dotatate scan showed bilobar tracer avid liver metastases, large lesion measuring 10.7 x 8.5 cm in the right lobe, 6.4 x 3.8 cm in the left lobe.  Multiple tracer avid lymph nodes identified within the small bowel mesentery, compatible with nodal metastasis.  Focal area of increased radiotracer uptake localized to the loop of small bowel within the left lower quadrant, likely small bowel primary.  No uptake noted in the right kidney lesion.   Patient does not have carcinoid symptoms.  However given the burden of disease, decision was made to proceed with octreotide  injections, which Hannah Madden started from 07/15/2023.  Hannah Madden tolerated the injections well.  Dose given today.  Plan to continue monthly.   Hannah Madden had urology evaluation for the kidney mass.  Hannah Madden was not deemed to be a surgical candidate.  Cystoscopy showed no bladder tumor.  Surveillance versus IR evaluation for possible ablation was recommended.  Referral sent to IR Dr. Mabel Savage and I also discussed her case with him previously over the phone.  Schedule  follow-up scan in 3-4 months to assess tumor response.  RTC in 4 weeks for labs, office visit and octreotide  injection.  Right kidney mass Tumor located at the upper pole of the right  kidney, measuring approximately 3.9 cm. Imaging suggests renal carcinoma, but further evaluation is required. Urology will assess the possibility of surgical resection versus ablation.  Hannah Madden does have an appointment coming up with their office next week.    The tumor is distinct from the neuroendocrine tumor and is suspected to be primary renal cancer. Surgical resection is preferred if feasible to obtain tissue for definitive diagnosis and to assess the grade of the tumor.   Hannah Madden had urology evaluation for the kidney mass.  Hannah Madden was not deemed to be a surgical candidate.  Cystoscopy showed no bladder tumor.  Surveillance versus IR evaluation for possible ablation was recommended.  Referral sent to IR Dr. Mabel Savage.   I reviewed lab results and outside records for this visit and discussed relevant results with the patient. Diagnosis, plan of care and treatment options were also discussed in detail with the patient. Opportunity provided to ask questions and answers provided to her apparent satisfaction. Provided instructions to call our clinic with any problems, questions or concerns prior to return visit. I recommended to continue follow-up with PCP and sub-specialists. Hannah Madden verbalized understanding and agreed with the plan.   NCCN guidelines have been consulted in the planning of this patient's care.  I spent a total of 30 minutes during this encounter with the patient including review of chart and various tests results, discussions about plan of care and coordination of care plan.   Hiroko Tregre, MD   [facility] - A DEPT OF [facility] [id number] [place] [address] Dept: [phone] Dept Fax: [phone]    CHIEF COMPLAINT/ REASON FOR VISIT:   Well-differentiated neuroendocrine tumor, grade 1, with extensive liver metastatic disease, presumed to be small bowel primary.  Right kidney mass, likely separate  primary.  Current Treatment: Monthly octreotide  injections started from 07/15/2023.  INTERVAL HISTORY:   Discussed the use of AI scribe software for clinical note transcription with the patient, who gave verbal consent to proceed.  History of Present Illness  Hannah Madden is an 89 year old female who presents with a recent fainting spell. Hannah Madden is accompanied by her daughter, who is her primary caregiver.  Hannah Madden experienced a fainting episode on Friday night around 8:45 PM after dining out and consuming a margarita. Following the meal, Hannah Madden developed a rash on her hands similar to one Hannah Madden had during a previous hospital stay. Upon returning home, Hannah Madden vomited and became nonresponsive, leaning to the left, which prompted her daughter to call 911. Hannah Madden slowly regained alertness as emergency services arrived. Her blood pressure has been noted to be on the lower side, while her heart rate is on the higher side. No changes in appetite, diarrhea, or palpitations have been noted. Hannah Madden did have a headache last week, for which Hannah Madden took Tylenol .  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 Hannah Madden has not experienced fainting episodes prior to April of this year. Her daughter is concerned about the safety of her driving and the need for constant supervision due to these episodes.  Hannah Madden has a kidney tumor that is not currently being operated on, with ablation being considered as a future option. There are also large  tumors in her liver. Hannah Madden has been receiving monthly injections to manage her condition.  Hannah Madden experiences puffiness in her stomach, which has shifted from below to above the belt line. Hannah Madden has a history of acid reflux, which is influenced by her diet. Her daughter has noticed a new bulge in her abdomen.  Hannah Madden lives in a multi-story home and has adjusted her behavior to prevent falls, such as holding onto the rail and taking her time on the  stairs.   I have reviewed the past medical history, past surgical history, social history and family history with the patient and they are unchanged from previous note.  HISTORY OF PRESENT ILLNESS:   ONCOLOGY HISTORY:   Patient presented to the ED with complaints of worsening abdominal pain, back pain and near syncope on 06/19/2023 and was hospitalized for further evaluation.   CT abdomen and pelvis on 06/20/2023 showed multiple large heterogeneous liver lesions, largest measuring 10.6 cm in the right hepatic lobe and 5.7 cm in the left hepatic lobe.  Also noted was heterogeneous and has a mass in the superior pole of the right kidney measuring 3.5 cm, concerning for renal cell carcinoma.  Mesenteric mass or lymphadenopathy in the left central mesentery measuring 3.9 cm.  CT chest showed no evidence of metastatic disease in the chest.   Urology and interventional radiology was consulted.  Though Hannah Madden may have renal cell carcinoma, liver metastatic disease was not felt to be related as it is not common for renal cell carcinoma to metastasize to liver.  Hannah Madden may have a lesion in the bladder based on the CT imaging and hence outpatient cystoscopy and further workup was planned by urology.   On 06/22/2023, Hannah Madden underwent ultrasound-guided biopsy of the liver lesion.  Pathology showed well-differentiated neuroendocrine tumor, grade 1, Ki-67 index of 2%.  The neoplastic cells were diffusely and strongly positive for the neuroendocrine markers chromogranin and synaptic ficin.  They were negative for cytokeratin 7 and cytokeratin 20.  The tumor exhibits focal patchy nonspecific positivity for PAX 8 which is of uncertain significance.     On her initial consultation with us  on 06/30/2023, chromogranin A was elevated at 1495 ng/mL.  Request submitted for PET dotatate scan.    On 07/09/2023, PET dotatate scan showed bilobar tracer avid liver metastases, large lesion measuring 10.7 x 8.5 cm in the right lobe, 6.4 x  3.8 cm in the left lobe.  Multiple tracer avid lymph nodes identified within the small bowel mesentery, compatible with nodal metastasis.  Focal area of increased radiotracer uptake localized to the loop of small bowel within the left lower quadrant, likely small bowel primary.  No uptake noted in the right kidney lesion.   Patient does not have carcinoid symptoms.  However given the burden of disease, decision was made to proceed with octreotide  injections, which Hannah Madden started from 07/15/2023.  Plan to continue monthly.   Hannah Madden had urology evaluation for the kidney mass.  Hannah Madden was not deemed to be a surgical candidate.  Cystoscopy showed no bladder tumor.  Surveillance versus IR evaluation for possible ablation was recommended.  Referral sent to IR Dr. Mabel Savage.   REVIEW OF SYSTEMS:   Review of Systems - Oncology  All other pertinent systems were reviewed with the patient and are negative.  ALLERGIES: Hannah Madden is allergic to atorvastatin, macrobid [nitrofurantoin], penicillins, and ciprofloxacin .  MEDICATIONS:  Current Outpatient Medications  Medication Sig Dispense Refill   brimonidine -timolol  (COMBIGAN ) 0.2-0.5 % ophthalmic solution Place 1 drop into  the right eye 2 (two) times daily.     brinzolamide  (AZOPT ) 1 % ophthalmic suspension Place 1 drop into the right eye 2 (two) times daily.     docusate sodium  (COLACE) 100 MG capsule Take 1 capsule (100 mg total) by mouth 2 (two) times daily. (Patient not taking: Reported on 07/21/2023) 10 capsule 0   ondansetron  (ZOFRAN ) 4 MG tablet Take 1 tablet (4 mg total) by mouth every 8 (eight) hours as needed for nausea or vomiting. 90 tablet 3   pantoprazole  (PROTONIX ) 40 MG tablet Take 1 tablet (40 mg total) by mouth daily. 90 tablet 3   tobramycin (TOBREX) 0.3 % ophthalmic solution Place 1 drop into the right eye as needed (before eye injection).     No current facility-administered medications for this visit.     VITALS:   Blood pressure 124/76, pulse 66,  temperature 97.7 F (36.5 C), resp. rate 17, weight 113 lb (51.3 kg), SpO2 100%.  Wt Readings from Last 3 Encounters:  08/11/23 113 lb (51.3 kg)  07/21/23 112 lb 9.6 oz (51.1 kg)  06/30/23 113 lb 12.8 oz (51.6 kg)    Body mass index is 18.8 kg/m.        PHYSICAL EXAM:   Physical Exam Constitutional:      General: Hannah Madden is not in acute distress.    Appearance: Normal appearance.  HENT:     Head: Normocephalic and atraumatic.   Cardiovascular:     Rate and Rhythm: Normal rate.  Pulmonary:     Effort: Pulmonary effort is normal. No respiratory distress.  Abdominal:     General: There is no distension.   Neurological:     General: No focal deficit present.     Mental Status: Hannah Madden is alert and oriented to person, place, and time.   Psychiatric:        Mood and Affect: Mood normal.        Behavior: Behavior normal.        LABORATORY DATA:   I have reviewed the data as listed.  Results for orders placed or performed in visit on 08/11/23  Chromogranin A  Result Value Ref Range   Chromogranin A (ng/mL) 1,600.0 (H) 0.0 - 101.8 ng/mL  CMP (Cancer Center only)  Result Value Ref Range   Sodium 135 135 - 145 mmol/L   Potassium 4.9 3.5 - 5.1 mmol/L   Chloride 101 98 - 111 mmol/L   CO2 22 22 - 32 mmol/L   Glucose, Bld 92 70 - 99 mg/dL   BUN 17 8 - 23 mg/dL   Creatinine 1.61 0.96 - 1.00 mg/dL   Calcium  9.7 8.9 - 10.3 mg/dL   Total Protein 7.2 6.5 - 8.1 g/dL   Albumin 4.3 3.5 - 5.0 g/dL   AST 21 15 - 41 U/L   ALT 10 0 - 44 U/L   Alkaline Phosphatase 98 38 - 126 U/L   Total Bilirubin 0.2 0.0 - 1.2 mg/dL   GFR, Estimated >04 >54 mL/min   Anion gap 13 5 - 15  CBC with Differential (Cancer Center Only)  Result Value Ref Range   WBC Count 8.6 4.0 - 10.5 K/uL   RBC 3.42 (L) 3.87 - 5.11 MIL/uL   Hemoglobin 10.0 (L) 12.0 - 15.0 g/dL   HCT 09.8 (L) 11.9 - 14.7 %   MCV 90.4 80.0 - 100.0 fL   MCH 29.2 26.0 - 34.0 pg   MCHC 32.4 30.0 - 36.0 g/dL   RDW 13.9  11.5 - 15.5 %    Platelet Count 229 150 - 400 K/uL   nRBC 0.0 0.0 - 0.2 %   Neutrophils Relative % 44 %   Neutro Abs 3.8 1.7 - 7.7 K/uL   Lymphocytes Relative 33 %   Lymphs Abs 2.9 0.7 - 4.0 K/uL   Monocytes Relative 8 %   Monocytes Absolute 0.7 0.1 - 1.0 K/uL   Eosinophils Relative 14 %   Eosinophils Absolute 1.2 (H) 0.0 - 0.5 K/uL   Basophils Relative 1 %   Basophils Absolute 0.1 0.0 - 0.1 K/uL   Immature Granulocytes 0 %   Abs Immature Granulocytes 0.03 0.00 - 0.07 K/uL      RADIOGRAPHIC STUDIES:  No recent pertinent imaging available.    CODE STATUS:  Code Status History     Date Active Date Inactive Code Status Order ID Comments User Context   06/20/2023 0855 06/23/2023 1921 Full Code [id number]  Danice Dural, MD Inpatient   06/20/2023 0855 06/20/2023 0855 Full Code [id number]  Danice Dural, MD Inpatient   09/12/2013 1422 09/13/2013 1539 Full Code [id number]  Jasmine Mesi, MD Inpatient    Questions for Most Recent Historical Code Status (Order [id number])     Question Answer   By: Consent: discussion documented in EHR            Orders Placed This Encounter  Procedures   CBC with Differential (Cancer Center Only)    Standing Status:   Standing    Number of Occurrences:   6    Expiration Date:   08/10/2024   CMP (Cancer Center only)    Standing Status:   Standing    Number of Occurrences:   6    Expiration Date:   08/10/2024   Magnesium     Standing Status:   Standing    Number of Occurrences:   6    Expiration Date:   08/10/2024   Chromogranin A    Standing Status:   Standing    Number of Occurrences:   4    Expiration Date:   08/10/2024     Future Appointments  Date Time Provider Department Center  08/24/2023  1:40 PM Roslyn Coombe, MD [facility] None     This document was completed utilizing speech recognition software. Grammatical errors, random word insertions, pronoun errors, and incomplete sentences are an occasional consequence of this system due to  software limitations, ambient noise, and hardware issues. Any formal questions or concerns about the content, text or information contained within the body of this dictation should be directly addressed to the provider for clarification.

## 2023-08-13 ENCOUNTER — Encounter: Payer: Self-pay | Admitting: Oncology

## 2023-08-13 LAB — CHROMOGRANIN A: Chromogranin A (ng/mL): 1600 ng/mL — ABNORMAL HIGH (ref 0.0–101.8)

## 2023-08-24 ENCOUNTER — Ambulatory Visit: Admitting: Internal Medicine

## 2023-08-28 ENCOUNTER — Ambulatory Visit: Admitting: Internal Medicine

## 2023-09-08 ENCOUNTER — Encounter: Payer: Self-pay | Admitting: Oncology

## 2023-09-08 ENCOUNTER — Inpatient Hospital Stay (HOSPITAL_BASED_OUTPATIENT_CLINIC_OR_DEPARTMENT_OTHER): Admitting: Oncology

## 2023-09-08 ENCOUNTER — Inpatient Hospital Stay: Attending: Oncology

## 2023-09-08 ENCOUNTER — Inpatient Hospital Stay

## 2023-09-08 VITALS — BP 155/65 | HR 67 | Temp 98.4°F | Resp 18 | Ht 65.0 in | Wt 114.3 lb

## 2023-09-08 VITALS — BP 170/81 | HR 72 | Temp 98.3°F | Resp 16

## 2023-09-08 DIAGNOSIS — Z79899 Other long term (current) drug therapy: Secondary | ICD-10-CM | POA: Diagnosis not present

## 2023-09-08 DIAGNOSIS — C7B8 Other secondary neuroendocrine tumors: Secondary | ICD-10-CM

## 2023-09-08 DIAGNOSIS — N2889 Other specified disorders of kidney and ureter: Secondary | ICD-10-CM | POA: Diagnosis not present

## 2023-09-08 LAB — CBC WITH DIFFERENTIAL (CANCER CENTER ONLY)
Abs Immature Granulocytes: 0.02 K/uL (ref 0.00–0.07)
Basophils Absolute: 0 K/uL (ref 0.0–0.1)
Basophils Relative: 0 %
Eosinophils Absolute: 0.7 K/uL — ABNORMAL HIGH (ref 0.0–0.5)
Eosinophils Relative: 10 %
HCT: 31.9 % — ABNORMAL LOW (ref 36.0–46.0)
Hemoglobin: 10.2 g/dL — ABNORMAL LOW (ref 12.0–15.0)
Immature Granulocytes: 0 %
Lymphocytes Relative: 35 %
Lymphs Abs: 2.6 K/uL (ref 0.7–4.0)
MCH: 28.5 pg (ref 26.0–34.0)
MCHC: 32 g/dL (ref 30.0–36.0)
MCV: 89.1 fL (ref 80.0–100.0)
Monocytes Absolute: 0.6 K/uL (ref 0.1–1.0)
Monocytes Relative: 8 %
Neutro Abs: 3.5 K/uL (ref 1.7–7.7)
Neutrophils Relative %: 47 %
Platelet Count: 242 K/uL (ref 150–400)
RBC: 3.58 MIL/uL — ABNORMAL LOW (ref 3.87–5.11)
RDW: 13.9 % (ref 11.5–15.5)
WBC Count: 7.3 K/uL (ref 4.0–10.5)
nRBC: 0 % (ref 0.0–0.2)

## 2023-09-08 LAB — CMP (CANCER CENTER ONLY)
ALT: 14 U/L (ref 0–44)
AST: 23 U/L (ref 15–41)
Albumin: 4.6 g/dL (ref 3.5–5.0)
Alkaline Phosphatase: 101 U/L (ref 38–126)
Anion gap: 13 (ref 5–15)
BUN: 16 mg/dL (ref 8–23)
CO2: 23 mmol/L (ref 22–32)
Calcium: 9.7 mg/dL (ref 8.9–10.3)
Chloride: 102 mmol/L (ref 98–111)
Creatinine: 0.91 mg/dL (ref 0.44–1.00)
GFR, Estimated: >60 mL/min (ref >60)
Glucose, Bld: 91 mg/dL (ref 70–99)
Potassium: 4.6 mmol/L (ref 3.5–5.1)
Sodium: 138 mmol/L (ref 135–145)
Total Bilirubin: 0.5 mg/dL (ref 0.0–1.2)
Total Protein: 7.6 g/dL (ref 6.5–8.1)

## 2023-09-08 LAB — MAGNESIUM: Magnesium: 1.9 mg/dL (ref 1.7–2.4)

## 2023-09-08 MED ORDER — OCTREOTIDE ACETATE 20 MG IM KIT
20.0000 mg | PACK | Freq: Once | INTRAMUSCULAR | Status: AC
Start: 2023-09-08 — End: 2023-09-08
  Administered 2023-09-08: 20 mg via INTRAMUSCULAR
  Filled 2023-09-08: qty 1

## 2023-09-08 NOTE — Progress Notes (Signed)
 Patient presents today for Sandostatin  injection. Patient tolerated injection well in left gluteal with no complaints voiced.  Site clean and dry with no bruising or swelling noted.  No complaints of pain.  Discharged with vital signs stable and no signs or symptoms of distress noted.

## 2023-09-08 NOTE — Progress Notes (Signed)
 [facility]  ONCOLOGY CLINIC PROGRESS NOTE   Patient Care Team: [name], MD as PCP - General (Internal Medicine) Jakie Alm SAUNDERS, MD as Consulting Physician (Gastroenterology) [place], Heather, OD (Optometry) Livingston Rigg, MD (Inactive) as Consulting Physician (Dermatology)  PATIENT NAME: Hannah Madden   MR#: [id number] DOB: 12-Feb-1935  Date of visit: 09/08/2023   ASSESSMENT & PLAN:   DODI LEU is a 89 y.o.  lady with a past medical history of GERD, glaucoma, dyslipidemia, hiatal hernia, alopecia, was referred to our clinic for newly diagnosed neuroendocrine tumor with multiple liver mets.  Also has renal mass, concerning for separate renal primary.   Metastatic malignant neuroendocrine tumor to liver [facility]) Please review oncology history for additional details and timeline of events.    CT abdomen and pelvis on 06/20/2023 showed multiple large heterogeneous liver lesions, largest measuring 10.6 cm in the right hepatic lobe and 5.7 cm in the left hepatic lobe.  Also noted was heterogeneous and has a mass in the superior pole of the right kidney measuring 3.5 cm, concerning for renal cell carcinoma.  Mesenteric mass or lymphadenopathy in the left central mesentery measuring 3.9 cm.  CT chest showed no evidence of metastatic disease in the chest.  On 06/22/2023, she underwent ultrasound-guided biopsy of the liver lesion.  Pathology showed well-differentiated neuroendocrine tumor, grade 1, Ki-67 index of 2%.  The neoplastic cells were diffusely and strongly positive for the neuroendocrine markers chromogranin and synaptic ficin.  They were negative for cytokeratin 7 and cytokeratin 20.  The tumor exhibits focal patchy nonspecific positivity for PAX 8 which is of uncertain significance.    Previously I discussed diagnosis, prognosis, plan of care, treatment options.  Reviewed NCCN guidelines.  Stage IV disease because of extensive liver metastatic  disease.  The tumor is well-differentiated, low-grade, with a Ki-67 of 2%, indicating slow growth.  She does not have any carcinoid symptoms.  Despite its indolent nature, the significant hepatic tumor burden necessitates treatment to prevent liver function compromise. The primary site is suspected to be the gut lining but is not definitively identified. The tumor is not expected to significantly affect life expectancy. Treatment aims to control disease progression and prevent further spread, as a complete cure is not feasible due to extensive liver involvement.  On her initial consultation with us  on 06/30/2023, chromogranin A was elevated at 1495 ng/mL.  Request submitted for PET dotatate scan.    On 07/09/2023, PET dotatate scan showed bilobar tracer avid liver metastases, large lesion measuring 10.7 x 8.5 cm in the right lobe, 6.4 x 3.8 cm in the left lobe.  Multiple tracer avid lymph nodes identified within the small bowel mesentery, compatible with nodal metastasis.  Focal area of increased radiotracer uptake localized to the loop of small bowel within the left lower quadrant, likely small bowel primary.  No uptake noted in the right kidney lesion.   Patient does not have carcinoid symptoms.  However given the burden of disease, decision was made to proceed with octreotide  injections, which she started from 07/15/2023.  She tolerated the injections well.  Dose given today.  Plan to continue monthly.   She had urology evaluation for the kidney mass.  She was not deemed to be a surgical candidate.  Cystoscopy showed no bladder tumor.  Surveillance versus IR evaluation for possible ablation was recommended.  Referral sent to IR Dr. Alona and I also discussed her case with him previously over the phone.  Intervention  is currently on hold.  Schedule follow-up scan in 3-4 months to assess tumor response.  RTC in 4 weeks for labs, office visit and octreotide  injection.  Right kidney mass Tumor located  at the upper pole of the right kidney, measuring approximately 3.9 cm. Imaging suggests renal carcinoma, but further evaluation is required. Urology will assess the possibility of surgical resection versus ablation.  She does have an appointment coming up with their office next week.    The tumor is distinct from the neuroendocrine tumor and is suspected to be primary renal cancer. Surgical resection is preferred if feasible to obtain tissue for definitive diagnosis and to assess the grade of the tumor.   She had urology evaluation for the kidney mass.  She was not deemed to be a surgical candidate.  Cystoscopy showed no bladder tumor.  Surveillance versus IR evaluation for possible ablation was recommended.  Referral sent to IR Dr. Alona.  Intervention currently on hold    I reviewed lab results and outside records for this visit and discussed relevant results with the patient. Diagnosis, plan of care and treatment options were also discussed in detail with the patient. Opportunity provided to ask questions and answers provided to her apparent satisfaction. Provided instructions to call our clinic with any problems, questions or concerns prior to return visit. I recommended to continue follow-up with PCP and sub-specialists. She verbalized understanding and agreed with the plan.   NCCN guidelines have been consulted in the planning of this patient's care.  I spent a total of 30 minutes during this encounter with the patient including review of chart and various tests results, discussions about plan of care and coordination of care plan.   Chinita Patten, MD  09/08/2023 3:58 PM  [facility] - A DEPT OF [facility] [id number] [place] [address] Dept: [phone] Dept Fax: [phone]    CHIEF COMPLAINT/ REASON FOR VISIT:   Well-differentiated neuroendocrine tumor, grade 1, with extensive liver metastatic  disease, presumed to be small bowel primary.  Right kidney mass, likely separate primary.  Current Treatment: Monthly octreotide  injections started from 07/15/2023.  INTERVAL HISTORY:   Discussed the use of AI scribe software for clinical note transcription with the patient, who gave verbal consent to proceed.  History of Present Illness  ANAISHA MAGO is an 89 year old female undergoing treatment for a tumor who presents for follow-up and injection therapy.   She has experienced an improvement in energy levels and an increased appetite, particularly feeling hungry around 10 PM. These changes are attributed to her current treatment regimen, which includes injections aimed at shrinking the tumor.  Her hemoglobin level is 10.2.  No diarrhea, loose stools, pain, palpitations, or chest pain. She occasionally experiences headaches that resolve spontaneously and had a transient episode of lower back pain while eating, which resolved without intervention.  Her blood pressure readings have fluctuated, with a systolic reading of 155 today. Previous readings have varied, with a low of 124 and a high of 152 in recent months.   I have reviewed the past medical history, past surgical history, social history and family history with the patient and they are unchanged from previous note.  HISTORY OF PRESENT ILLNESS:   ONCOLOGY HISTORY:   Patient presented to the ED with complaints of worsening abdominal pain, back pain and near syncope on 06/19/2023 and was hospitalized for further evaluation.   CT abdomen and pelvis on  06/20/2023 showed multiple large heterogeneous liver lesions, largest measuring 10.6 cm in the right hepatic lobe and 5.7 cm in the left hepatic lobe.  Also noted was heterogeneous and has a mass in the superior pole of the right kidney measuring 3.5 cm, concerning for renal cell carcinoma.  Mesenteric mass or lymphadenopathy in the left central mesentery measuring 3.9 cm.  CT chest  showed no evidence of metastatic disease in the chest.   Urology and interventional radiology was consulted.  Though she may have renal cell carcinoma, liver metastatic disease was not felt to be related as it is not common for renal cell carcinoma to metastasize to liver.  She may have a lesion in the bladder based on the CT imaging and hence outpatient cystoscopy and further workup was planned by urology.   On 06/22/2023, she underwent ultrasound-guided biopsy of the liver lesion.  Pathology showed well-differentiated neuroendocrine tumor, grade 1, Ki-67 index of 2%.  The neoplastic cells were diffusely and strongly positive for the neuroendocrine markers chromogranin and synaptic ficin.  They were negative for cytokeratin 7 and cytokeratin 20.  The tumor exhibits focal patchy nonspecific positivity for PAX 8 which is of uncertain significance.     On her initial consultation with us  on 06/30/2023, chromogranin A was elevated at 1495 ng/mL.  Request submitted for PET dotatate scan.    On 07/09/2023, PET dotatate scan showed bilobar tracer avid liver metastases, large lesion measuring 10.7 x 8.5 cm in the right lobe, 6.4 x 3.8 cm in the left lobe.  Multiple tracer avid lymph nodes identified within the small bowel mesentery, compatible with nodal metastasis.  Focal area of increased radiotracer uptake localized to the loop of small bowel within the left lower quadrant, likely small bowel primary.  No uptake noted in the right kidney lesion.   Patient does not have carcinoid symptoms.  However given the burden of disease, decision was made to proceed with octreotide  injections, which she started from 07/15/2023.  Plan to continue monthly.   She had urology evaluation for the kidney mass.  She was not deemed to be a surgical candidate.  Cystoscopy showed no bladder tumor.  Surveillance versus IR evaluation for possible ablation was recommended.  Referral sent to IR Dr. Alona.   REVIEW OF SYSTEMS:    Review of Systems - Oncology  All other pertinent systems were reviewed with the patient and are negative.  ALLERGIES: She is allergic to atorvastatin, macrobid [nitrofurantoin], penicillins, and ciprofloxacin .  MEDICATIONS:  Current Outpatient Medications  Medication Sig Dispense Refill   brimonidine -timolol  (COMBIGAN ) 0.2-0.5 % ophthalmic solution Place 1 drop into the right eye 2 (two) times daily.     brinzolamide  (AZOPT ) 1 % ophthalmic suspension Place 1 drop into the right eye 2 (two) times daily.     ondansetron  (ZOFRAN ) 4 MG tablet Take 1 tablet (4 mg total) by mouth every 8 (eight) hours as needed for nausea or vomiting. 90 tablet 3   tobramycin (TOBREX) 0.3 % ophthalmic solution Place 1 drop into the right eye as needed (before eye injection).     docusate sodium  (COLACE) 100 MG capsule Take 1 capsule (100 mg total) by mouth 2 (two) times daily. (Patient not taking: Reported on 09/08/2023) 10 capsule 0   pantoprazole  (PROTONIX ) 40 MG tablet Take 1 tablet (40 mg total) by mouth daily. (Patient not taking: Reported on 09/08/2023) 90 tablet 3   No current facility-administered medications for this visit.     VITALS:   Blood  pressure (!) 155/65, pulse 67, temperature 98.4 F (36.9 C), temperature source Oral, resp. rate 18, height 5' 5 (1.651 m), weight 114 lb 4.8 oz (51.8 kg), SpO2 100%.  Wt Readings from Last 3 Encounters:  09/08/23 114 lb 4.8 oz (51.8 kg)  08/11/23 113 lb (51.3 kg)  07/21/23 112 lb 9.6 oz (51.1 kg)    Body mass index is 19.02 kg/m.     Onc Performance Status - 09/08/23 1504       ECOG Perf Status   ECOG Perf Status Ambulatory and capable of all selfcare but unable to carry out any work activities.  Up and about more than 50% of waking hours      KPS SCALE   KPS % SCORE Normal activity with effort, some s/s of disease            PHYSICAL EXAM:   Physical Exam Constitutional:      General: She is not in acute distress.    Appearance:  Normal appearance.  HENT:     Head: Normocephalic and atraumatic.  Cardiovascular:     Rate and Rhythm: Normal rate.  Pulmonary:     Effort: Pulmonary effort is normal. No respiratory distress.  Abdominal:     General: There is no distension.  Neurological:     General: No focal deficit present.     Mental Status: She is alert and oriented to person, place, and time.  Psychiatric:        Mood and Affect: Mood normal.        Behavior: Behavior normal.        LABORATORY DATA:   I have reviewed the data as listed.  Results for orders placed or performed in visit on 09/08/23  Magnesium   Result Value Ref Range   Magnesium  1.9 1.7 - 2.4 mg/dL  CMP (Cancer Center only)  Result Value Ref Range   Sodium 138 135 - 145 mmol/L   Potassium 4.6 3.5 - 5.1 mmol/L   Chloride 102 98 - 111 mmol/L   CO2 23 22 - 32 mmol/L   Glucose, Bld 91 70 - 99 mg/dL   BUN 16 8 - 23 mg/dL   Creatinine 9.08 9.55 - 1.00 mg/dL   Calcium  9.7 8.9 - 10.3 mg/dL   Total Protein 7.6 6.5 - 8.1 g/dL   Albumin 4.6 3.5 - 5.0 g/dL   AST 23 15 - 41 U/L   ALT 14 0 - 44 U/L   Alkaline Phosphatase 101 38 - 126 U/L   Total Bilirubin 0.5 0.0 - 1.2 mg/dL   GFR, Estimated >39 >39 mL/min   Anion gap 13 5 - 15  CBC with Differential (Cancer Center Only)  Result Value Ref Range   WBC Count 7.3 4.0 - 10.5 K/uL   RBC 3.58 (L) 3.87 - 5.11 MIL/uL   Hemoglobin 10.2 (L) 12.0 - 15.0 g/dL   HCT 68.0 (L) 63.9 - 53.9 %   MCV 89.1 80.0 - 100.0 fL   MCH 28.5 26.0 - 34.0 pg   MCHC 32.0 30.0 - 36.0 g/dL   RDW 86.0 88.4 - 84.4 %   Platelet Count 242 150 - 400 K/uL   nRBC 0.0 0.0 - 0.2 %   Neutrophils Relative % 47 %   Neutro Abs 3.5 1.7 - 7.7 K/uL   Lymphocytes Relative 35 %   Lymphs Abs 2.6 0.7 - 4.0 K/uL   Monocytes Relative 8 %   Monocytes Absolute 0.6 0.1 - 1.0 K/uL  Eosinophils Relative 10 %   Eosinophils Absolute 0.7 (H) 0.0 - 0.5 K/uL   Basophils Relative 0 %   Basophils Absolute 0.0 0.0 - 0.1 K/uL   Immature  Granulocytes 0 %   Abs Immature Granulocytes 0.02 0.00 - 0.07 K/uL      RADIOGRAPHIC STUDIES:  No recent pertinent imaging available.    CODE STATUS:  Code Status History     Date Active Date Inactive Code Status Order ID Comments User Context   06/20/2023 0855 06/23/2023 1921 Full Code [id number]  Celinda Alm Lot, MD Inpatient   06/20/2023 0855 06/20/2023 0855 Full Code [id number]  Celinda Alm Lot, MD Inpatient   09/12/2013 1422 09/13/2013 1539 Full Code [id number]  Addie Cordella Hamilton, MD Inpatient    Questions for Most Recent Historical Code Status (Order [id number])     Question Answer   By: Consent: discussion documented in EHR            Orders Placed This Encounter  Procedures   CBC with Differential (Cancer Center Only)    Standing Status:   Standing    Number of Occurrences:   6    Expiration Date:   09/07/2024   CMP (Cancer Center only)    Standing Status:   Standing    Number of Occurrences:   6    Expiration Date:   09/07/2024   Chromogranin A    Standing Status:   Standing    Number of Occurrences:   3    Expiration Date:   09/07/2024       This document was completed utilizing speech recognition software. Grammatical errors, random word insertions, pronoun errors, and incomplete sentences are an occasional consequence of this system due to software limitations, ambient noise, and hardware issues. Any formal questions or concerns about the content, text or information contained within the body of this dictation should be directly addressed to the provider for clarification.

## 2023-09-08 NOTE — Patient Instructions (Signed)
 [facility] - A DEPT OF [facility]  Discharge Instructions: Thank you for choosing [facility] to provide your oncology and hematology care.   If you have a lab appointment with the Cancer Center, please go directly to the Cancer Center and check in at the registration area.   Wear comfortable clothing and clothing appropriate for easy access to any Portacath or PICC line.   We strive to give you quality time with your provider. You may need to reschedule your appointment if you arrive late (15 or more minutes).  Arriving late affects you and other patients whose appointments are after yours.  Also, if you miss three or more appointments without notifying the office, you may be dismissed from the clinic at the provider's discretion.      For prescription refill requests, have your pharmacy contact our office and allow 72 hours for refills to be completed.    Today you received the following chemotherapy and/or immunotherapy agents Sandostatin  injection.  Octreotide  Injection Solution What is this medication? OCTREOTIDE  (ok TREE oh tide) treats high levels of growth hormone (acromegaly). It works by reducing the amount of growth hormone your body makes. This reduces symptoms and the risk of health problems caused by too much growth hormone, such as diabetes and heart disease. It may also be used to treat diarrhea caused by neuroendocrine tumors. It works by slowing down the release of serotonin from the tumor cells. This reduces the number of bowel movements you have. This medicine may be used for other purposes; ask your health care provider or pharmacist if you have questions. COMMON BRAND NAME(S): Bynfezia, Sandostatin  What should I tell my care team before I take this medication? They need to know if you have any of these conditions: Diabetes Gallbladder disease Heart disease Kidney disease Liver disease Pancreatic disease Thyroid  disease An unusual or allergic reaction to octreotide , other medications, foods, dyes, or preservatives Pregnant or trying to get pregnant Breastfeeding How should I use this medication? This medication is injected under the skin or into a vein. It is usually given by your care team in a hospital or clinic setting. If you get this medication at home, you will be taught how to prepare and give it. Use exactly as directed. Take it as directed on the prescription label at the same time every day. Keep taking it unless your care team tells you to stop. Allow the injection solution to come to room temperature before use. Do not warm it artificially. It is important that you put your used needles and syringes in a special sharps container. Do not put them in a trash can. If you do not have a sharps container, call your pharmacist or care team to get one. Talk to your care team about the use of this medication in children. Special care may be needed. Overdosage: If you think you have taken too much of this medicine contact a poison control center or emergency room at once. NOTE: This medicine is only for you. Do not share this medicine with others. What if I miss a dose? If you miss a dose, take it as soon as you can. If it is almost time for your next dose, take only that dose. Do not take double or extra doses. What may interact with this medication? Bromocriptine Certain medications for blood pressure, heart disease, irregular heartbeat Cyclosporine Diuretics Medications for diabetes, including insulin Quinidine This list may not describe all  possible interactions. Give your health care provider a list of all the medicines, herbs, non-prescription drugs, or dietary supplements you use. Also tell them if you smoke, drink alcohol, or use illegal drugs. Some items may interact with your medicine. What should I watch for while using this medication? Visit your care team for regular checks on your  progress. Tell your care team if your symptoms do not start to get better or if they get worse. To help reduce irritation at the injection site, use a different site for each injection and make sure the solution is at room temperature before use. This medication may cause decreases in blood sugar. Signs of low blood sugar include chills, cool, pale skin or cold sweats, drowsiness, extreme hunger, fast heartbeat, headache, nausea, nervousness or anxiety, shakiness, trembling, unsteadiness, tiredness, or weakness. Contact your care team right away if you experience any of these symptoms. This medication may increase blood sugar. The risk may be higher in patients who already have diabetes. Ask your care team what you can do to lower your risk of diabetes while taking this medication. You should make sure you get enough vitamin B12 while you are taking this medication. Discuss the foods you eat and the vitamins you take with your care team. What side effects may I notice from receiving this medication? Side effects that you should report to your care team as soon as possible: Allergic reactions--skin rash, itching, hives, swelling of the face, lips, tongue, or throat Gallbladder problems--severe stomach pain, nausea, vomiting, fever Heart rhythm changes--fast or irregular heartbeat, dizziness, feeling faint or lightheaded, chest pain, trouble breathing High blood sugar (hyperglycemia)--increased thirst or amount of urine, unusual weakness or fatigue, blurry vision Low blood sugar (hypoglycemia)--tremors or shaking, anxiety, sweating, cold or clammy skin, confusion, dizziness, rapid heartbeat Low thyroid levels (hypothyroidism)--unusual weakness or fatigue, increased sensitivity to cold, constipation, hair loss, dry skin, weight gain, feelings of depression Low vitamin B12 level--pain, tingling, or numbness in the hands or feet, muscle weakness, dizziness, confusion, trouble concentrating Oily or  light-colored stools, diarrhea, bloating, weight loss Pancreatitis--severe stomach pain that spreads to your back or gets worse after eating or when touched, fever, nausea, vomiting Slow heartbeat--dizziness, feeling faint or lightheaded, confusion, trouble breathing, unusual weakness or fatigue Side effects that usually do not require medical attention (report these to your care team if they continue or are bothersome): Diarrhea Dizziness Headache Nausea Pain, redness, or irritation at injection site Stomach pain This list may not describe all possible side effects. Call your doctor for medical advice about side effects. You may report side effects to FDA at 1-800-FDA-1088. Where should I keep my medication? Keep out of the reach of children and pets. Store in the refrigerator. Protect from light. Allow to come to room temperature naturally. Do not use artificial heat. If protected from light, the injection may be stored between 20 and 30 degrees C (70 and 86 degrees F) for 14 days. After the initial use, throw away any unused portion of a multiple dose vial after 14 days. Get rid of any unused portions of the ampules after use. To get rid of medications that are no longer needed or have expired: Take the medication to a medication take-back program. Ask your pharmacy or law enforcement to find a location. If you cannot return the medication, ask your pharmacist or care team how to get rid of the medication safely. NOTE: This sheet is a summary. It may not cover all possible information. If you  have questions about this medicine, talk to your doctor, pharmacist, or health care provider.  2024 Elsevier/Gold Standard (2023-01-30 00:00:00)   To help prevent nausea and vomiting after your treatment, we encourage you to take your nausea medication as directed.  BELOW ARE SYMPTOMS THAT SHOULD BE REPORTED IMMEDIATELY: *FEVER GREATER THAN 100.4 F (38 C) OR HIGHER *CHILLS OR SWEATING *NAUSEA AND  VOMITING THAT IS NOT CONTROLLED WITH YOUR NAUSEA MEDICATION *UNUSUAL SHORTNESS OF BREATH *UNUSUAL BRUISING OR BLEEDING *URINARY PROBLEMS (pain or burning when urinating, or frequent urination) *BOWEL PROBLEMS (unusual diarrhea, constipation, pain near the anus) TENDERNESS IN MOUTH AND THROAT WITH OR WITHOUT PRESENCE OF ULCERS (sore throat, sores in mouth, or a toothache) UNUSUAL RASH, SWELLING OR PAIN  UNUSUAL VAGINAL DISCHARGE OR ITCHING   Items with * indicate a potential emergency and should be followed up as soon as possible or go to the Emergency Department if any problems should occur.  Please show the CHEMOTHERAPY ALERT CARD or IMMUNOTHERAPY ALERT CARD at check-in to the Emergency Department and triage nurse.  Should you have questions after your visit or need to cancel or reschedule your appointment, please contact [facility] - A DEPT OF [facility]  Dept: [phone]  and follow the prompts.  Office hours are 8:00 a.m. to 4:30 p.m. Monday - Friday. Please note that voicemails left after 4:00 p.m. may not be returned until the following business day.  We are closed weekends and major holidays. You have access to a nurse at all times for urgent questions. Please call the main number to the clinic Dept: [phone] and follow the prompts.   For any non-urgent questions, you may also contact your provider using MyChart. We now offer e-Visits for anyone 23 and older to request care online for non-urgent symptoms. For details visit [email or web address].   Also download the MyChart app! Go to the app store, search "MyChart", open the app, select [place], and log in with your MyChart username and password.

## 2023-09-08 NOTE — Assessment & Plan Note (Addendum)
 Please review oncology history for additional details and timeline of events.    CT abdomen and pelvis on 06/20/2023 showed multiple large heterogeneous liver lesions, largest measuring 10.6 cm in the right hepatic lobe and 5.7 cm in the left hepatic lobe.  Also noted was heterogeneous and has a mass in the superior pole of the right kidney measuring 3.5 cm, concerning for renal cell carcinoma.  Mesenteric mass or lymphadenopathy in the left central mesentery measuring 3.9 cm.  CT chest showed no evidence of metastatic disease in the chest.  On 06/22/2023, she underwent ultrasound-guided biopsy of the liver lesion.  Pathology showed well-differentiated neuroendocrine tumor, grade 1, Ki-67 index of 2%.  The neoplastic cells were diffusely and strongly positive for the neuroendocrine markers chromogranin and synaptic ficin.  They were negative for cytokeratin 7 and cytokeratin 20.  The tumor exhibits focal patchy nonspecific positivity for PAX 8 which is of uncertain significance.    Previously I discussed diagnosis, prognosis, plan of care, treatment options.  Reviewed NCCN guidelines.  Stage IV disease because of extensive liver metastatic disease.  The tumor is well-differentiated, low-grade, with a Ki-67 of 2%, indicating slow growth.  She does not have any carcinoid symptoms.  Despite its indolent nature, the significant hepatic tumor burden necessitates treatment to prevent liver function compromise. The primary site is suspected to be the gut lining but is not definitively identified. The tumor is not expected to significantly affect life expectancy. Treatment aims to control disease progression and prevent further spread, as a complete cure is not feasible due to extensive liver involvement.  On her initial consultation with us  on 06/30/2023, chromogranin A was elevated at 1495 ng/mL.  Request submitted for PET dotatate scan.    On 07/09/2023, PET dotatate scan showed bilobar tracer avid liver  metastases, large lesion measuring 10.7 x 8.5 cm in the right lobe, 6.4 x 3.8 cm in the left lobe.  Multiple tracer avid lymph nodes identified within the small bowel mesentery, compatible with nodal metastasis.  Focal area of increased radiotracer uptake localized to the loop of small bowel within the left lower quadrant, likely small bowel primary.  No uptake noted in the right kidney lesion.   Patient does not have carcinoid symptoms.  However given the burden of disease, decision was made to proceed with octreotide  injections, which she started from 07/15/2023.  She tolerated the injections well.  Dose given today.  Plan to continue monthly.   She had urology evaluation for the kidney mass.  She was not deemed to be a surgical candidate.  Cystoscopy showed no bladder tumor.  Surveillance versus IR evaluation for possible ablation was recommended.  Referral sent to IR Dr. Alona and I also discussed her case with him previously over the phone.  Intervention is currently on hold.  Schedule follow-up scan in 3-4 months to assess tumor response.  RTC in 4 weeks for labs, office visit and octreotide  injection.

## 2023-09-08 NOTE — Assessment & Plan Note (Signed)
 Tumor located at the upper pole of the right kidney, measuring approximately 3.9 cm. Imaging suggests renal carcinoma, but further evaluation is required. Urology will assess the possibility of surgical resection versus ablation.  She does have an appointment coming up with their office next week.    The tumor is distinct from the neuroendocrine tumor and is suspected to be primary renal cancer. Surgical resection is preferred if feasible to obtain tissue for definitive diagnosis and to assess the grade of the tumor.   She had urology evaluation for the kidney mass.  She was not deemed to be a surgical candidate.  Cystoscopy showed no bladder tumor.  Surveillance versus IR evaluation for possible ablation was recommended.  Referral sent to IR Dr. Alona.  Intervention currently on hold

## 2023-09-10 LAB — CHROMOGRANIN A: Chromogranin A (ng/mL): 1431 ng/mL — ABNORMAL HIGH (ref 0.0–101.8)

## 2023-09-14 ENCOUNTER — Other Ambulatory Visit

## 2023-09-14 ENCOUNTER — Ambulatory Visit

## 2023-09-14 ENCOUNTER — Ambulatory Visit: Admitting: Oncology

## 2023-10-05 ENCOUNTER — Other Ambulatory Visit (INDEPENDENT_AMBULATORY_CARE_PROVIDER_SITE_OTHER): Payer: Self-pay

## 2023-10-05 ENCOUNTER — Ambulatory Visit (INDEPENDENT_AMBULATORY_CARE_PROVIDER_SITE_OTHER): Admitting: Orthopedic Surgery

## 2023-10-05 DIAGNOSIS — M25562 Pain in left knee: Secondary | ICD-10-CM | POA: Diagnosis not present

## 2023-10-05 DIAGNOSIS — M25561 Pain in right knee: Secondary | ICD-10-CM

## 2023-10-05 DIAGNOSIS — M170 Bilateral primary osteoarthritis of knee: Secondary | ICD-10-CM

## 2023-10-06 ENCOUNTER — Encounter: Payer: Self-pay | Admitting: Orthopedic Surgery

## 2023-10-06 MED ORDER — TRIAMCINOLONE ACETONIDE 40 MG/ML IJ SUSP
40.0000 mg | INTRAMUSCULAR | Status: AC | PRN
  Administered 2023-10-05: 40 mg via INTRA_ARTICULAR

## 2023-10-06 MED ORDER — LIDOCAINE HCL 1 % IJ SOLN
5.0000 mL | INTRAMUSCULAR | Status: AC | PRN
  Administered 2023-10-05: 5 mL

## 2023-10-06 MED ORDER — BUPIVACAINE HCL 0.25 % IJ SOLN
4.0000 mL | INTRAMUSCULAR | Status: AC | PRN
  Administered 2023-10-05: 4 mL via INTRA_ARTICULAR

## 2023-10-06 NOTE — Progress Notes (Signed)
 Office Visit Note   Patient: Hannah Madden           Date of Birth: 1935-01-03           MRN: [id number] Visit Date: 10/05/2023 Requested by: [name], MD [address] [place],  [place] [address] PCP: [name], MD  Subjective: Chief Complaint  Patient presents with   Other    Bil knee pain    HPI: Hannah Madden is a 89 y.o. female who presents to the office reporting bilateral knee pain left greater than right.  Has a known history of left knee arthritis.  Patient describes some weakness and giving way.  History of prior arthroscopy and meniscal debridement on that side.  Describes stiffness and swelling.  Currently being treated for neuroectodermal tumor.  Here with her daughter today..                ROS: All systems reviewed are negative as they relate to the chief complaint within the history of present illness.  Patient denies fevers or chills.  Assessment & Plan: Visit Diagnoses:  1. Pain in both knees, unspecified chronicity     Plan: Impression is left greater than right knee arthritis with fairly impressive medial compartment arthritis.  Cortisone injections have helped her in the past.  Gel to a lesser degree.  We will inject with cortisone today and preapproved for for gel.  Follow-up when this cortisone shot wears off. This patient is diagnosed with osteoarthritis of the knee(s).    Radiographs show evidence of joint space narrowing, osteophytes, subchondral sclerosis and/or subchondral cysts.  This patient has knee pain which interferes with functional and activities of daily living.    This patient has experienced inadequate response, adverse effects and/or intolerance with conservative treatments such as acetaminophen , NSAIDS, topical creams, physical therapy or regular exercise, knee bracing and/or weight loss.   This patient has experienced inadequate response or has a contraindication to intra articular steroid injections for at least 3 months.    This patient is not scheduled to have a total knee replacement within 6 months of starting treatment with viscosupplementation.   Follow-Up Instructions: No follow-ups on file.   Orders:  Orders Placed This Encounter  Procedures   XR KNEE 3 VIEW RIGHT   XR KNEE 3 VIEW LEFT   No orders of the defined types were placed in this encounter.     Procedures: Large Joint Inj: L knee on 10/05/2023 11:03 AM Indications: diagnostic evaluation, joint swelling and pain Details: 18 G 1.5 in needle, superolateral approach  Arthrogram: No  Medications: 5 mL lidocaine  1 %; 4 mL bupivacaine  0.25 %; 40 mg triamcinolone  acetonide 40 MG/ML Outcome: tolerated well, no immediate complications Procedure, treatment alternatives, risks and benefits explained, specific risks discussed. Consent was given by the patient. Immediately prior to procedure a time out was called to verify the correct patient, procedure, equipment, support staff and site/side marked as required. Patient was prepped and draped in the usual sterile fashion.       Clinical Data: No additional findings.  Objective: Vital Signs: There were no vitals taken for this visit.  Physical Exam:  Constitutional: Patient appears well-developed HEENT:  Head: Normocephalic Eyes:EOM are normal Neck: Normal range of motion Cardiovascular: Normal rate Pulmonary/chest: Effort normal Neurologic: Patient is alert Skin: Skin is warm Psychiatric: Patient has normal mood and affect  Ortho Exam: Ortho exam demonstrates mild effusion in the left knee.  No effusion right  knee.  3 to 4 degree flexion contracture in the left knee right knee is straight.  Bends past 90 degrees in both knees.  Pedal pulses palpable.  No groin pain with internal/external Tatian of the leg.  Collateral cruciate ligaments are stable in that left knee.  Specialty Comments:  No specialty comments available.  Imaging: XR KNEE 3 VIEW RIGHT Result Date: 10/06/2023 AP  lateral merchant radiographs right knee reviewed.  Moderate arthritis is present in the medial greater than lateral compartment.  No acute fracture.  Bone slightly osteopenic.  XR KNEE 3 VIEW LEFT Result Date: 10/06/2023 AP lateral merchant radiographs left knee reviewed.  Severe end-stage arthritis is present worse in the medial compartment.  No acute fracture.  Bones look slightly osteopenic.  Bone-on-bone changes noted most severely in the medial compartment    PMFS History: Patient Active Problem List   Diagnosis Date Noted   Metastatic malignant neuroendocrine tumor to liver (HCC) 07/01/2023   Right kidney mass 07/01/2023   Carotid artery disease (HCC) 06/30/2023   Dysuria 06/24/2023   Elevated lipase 06/20/2023   Intraabdominal mass 06/20/2023   Low back pain 09/18/2022   Bilateral knee pain 09/18/2022   Allergic drug rash 07/19/2021   B12 deficiency 12/31/2020   Diarrhea 08/07/2020   Urinary frequency 08/07/2020   Vitamin D  deficiency 12/24/2019   Hyperglycemia 08/08/2019   Blood pressure elevated without history of HTN 07/07/2019   Upper back pain on left side 06/25/2019   Acute pharyngitis 05/21/2018   Memory changes 03/16/2018   Branch retinal vein occlusion of both eyes 04/03/2017   Peripheral focal chorioretinal inflammation of both eyes 04/03/2017   Primary open-angle glaucoma, bilateral, moderate stage 04/03/2017   Pseudophakia of both eyes 04/03/2017   Cough 03/01/2015   Macular edema 02/15/2015   Narrow angle glaucoma suspect of both eyes 02/15/2015   Arthritis of left lower extremity 08/24/2014   Pain and swelling of left knee 08/22/2014   Acute medial meniscal tear 09/12/2013   Encounter for well adult exam with abnormal findings 08/24/2012   CONTACT DERMATITIS 09/17/2007   Normocytic anemia 08/24/2007   GLAUCOMA 08/24/2007   DEGENERATIVE JOINT DISEASE 08/24/2007   Pure hypercholesterolemia 08/23/2007   Anxiety state 08/23/2007   GERD 08/23/2007    ALOPECIA 08/23/2007   Allergic rhinitis 08/23/2007   Past Medical History:  Diagnosis Date   Alopecia    stress related and resolved since her Mother moved to SNF   Carotid artery occlusion    GERD (gastroesophageal reflux disease)    takes Nexium  daily   Glaucoma    uses eye drops daily   H/O hiatal hernia    Hypercholesteremia    takes Simvastatin  daily   Pneumonia 03/03/1958   Syncope 08/20/2020   UTI (urinary tract infection) 06/20/2023   Vitamin D  deficiency 12/24/2019    Family History  Problem Relation Age of Onset   COPD Neg Hx    Diabetes Neg Hx    Heart disease Neg Hx    Stroke Neg Hx    Colon cancer Neg Hx    Esophageal cancer Neg Hx    Pancreatic cancer Neg Hx    Stomach cancer Neg Hx     Past Surgical History:  Procedure Laterality Date   ABDOMINAL HYSTERECTOMY     fibroids.   COLONOSCOPY     KNEE ARTHROSCOPY WITH MEDIAL MENISECTOMY Left 09/12/2013   Procedure: KNEE ARTHROSCOPY WITH DEBRIDEMENT;  Surgeon: Cordella Glendia Hutchinson, MD;  Location: [facility] OR;  Service:  Orthopedics;  Laterality: Left;   Social History   Occupational History   Occupation: financial services    Comment: Museum/gallery exhibitions officer, now Chief Technology Officer  Tobacco Use   Smoking status: Never   Smokeless tobacco: Never  Vaping Use   Vaping status: Never Used  Substance and Sexual Activity   Alcohol  use: No   Drug use: No   Sexual activity: Yes    Partners: Female    Birth control/protection: Surgical

## 2023-10-07 ENCOUNTER — Telehealth: Payer: Self-pay

## 2023-10-07 NOTE — Telephone Encounter (Signed)
-----   Message from KANDICE Glendia Hutchinson sent at 10/06/2023  9:16 PM EDT ----- Erskin Maxwell can you approve gel for [facility] left knee thanks

## 2023-10-07 NOTE — Telephone Encounter (Signed)
Auth needed for gel 

## 2023-10-08 ENCOUNTER — Inpatient Hospital Stay (HOSPITAL_BASED_OUTPATIENT_CLINIC_OR_DEPARTMENT_OTHER): Admitting: Physician Assistant

## 2023-10-08 ENCOUNTER — Inpatient Hospital Stay: Attending: Oncology

## 2023-10-08 ENCOUNTER — Encounter: Payer: Self-pay | Admitting: Physician Assistant

## 2023-10-08 ENCOUNTER — Ambulatory Visit: Payer: Self-pay | Admitting: Physician Assistant

## 2023-10-08 ENCOUNTER — Inpatient Hospital Stay

## 2023-10-08 VITALS — BP 152/71 | HR 74 | Temp 97.8°F | Resp 16 | Ht 65.0 in | Wt 114.4 lb

## 2023-10-08 DIAGNOSIS — D72828 Other elevated white blood cell count: Secondary | ICD-10-CM

## 2023-10-08 DIAGNOSIS — K219 Gastro-esophageal reflux disease without esophagitis: Secondary | ICD-10-CM | POA: Diagnosis not present

## 2023-10-08 DIAGNOSIS — C7B8 Other secondary neuroendocrine tumors: Secondary | ICD-10-CM

## 2023-10-08 DIAGNOSIS — D72829 Elevated white blood cell count, unspecified: Secondary | ICD-10-CM | POA: Diagnosis not present

## 2023-10-08 DIAGNOSIS — Z79899 Other long term (current) drug therapy: Secondary | ICD-10-CM | POA: Diagnosis not present

## 2023-10-08 LAB — CBC WITH DIFFERENTIAL (CANCER CENTER ONLY)
Abs Immature Granulocytes: 0.06 K/uL (ref 0.00–0.07)
Basophils Absolute: 0 K/uL (ref 0.0–0.1)
Basophils Relative: 0 %
Eosinophils Absolute: 0.1 K/uL (ref 0.0–0.5)
Eosinophils Relative: 1 %
HCT: 37.6 % (ref 36.0–46.0)
Hemoglobin: 12.1 g/dL (ref 12.0–15.0)
Immature Granulocytes: 1 %
Lymphocytes Relative: 28 %
Lymphs Abs: 3.4 K/uL (ref 0.7–4.0)
MCH: 28.9 pg (ref 26.0–34.0)
MCHC: 32.2 g/dL (ref 30.0–36.0)
MCV: 89.7 fL (ref 80.0–100.0)
Monocytes Absolute: 1 K/uL (ref 0.1–1.0)
Monocytes Relative: 8 %
Neutro Abs: 7.4 K/uL (ref 1.7–7.7)
Neutrophils Relative %: 62 %
Platelet Count: 242 K/uL (ref 150–400)
RBC: 4.19 MIL/uL (ref 3.87–5.11)
RDW: 13.8 % (ref 11.5–15.5)
WBC Count: 12 K/uL — ABNORMAL HIGH (ref 4.0–10.5)
nRBC: 0.2 % (ref 0.0–0.2)

## 2023-10-08 LAB — CMP (CANCER CENTER ONLY)
ALT: 19 U/L (ref 0–44)
AST: 34 U/L (ref 15–41)
Albumin: 5 g/dL (ref 3.5–5.0)
Alkaline Phosphatase: 108 U/L (ref 38–126)
Anion gap: 14 (ref 5–15)
BUN: 25 mg/dL — ABNORMAL HIGH (ref 8–23)
CO2: 23 mmol/L (ref 22–32)
Calcium: 10 mg/dL (ref 8.9–10.3)
Chloride: 101 mmol/L (ref 98–111)
Creatinine: 1 mg/dL (ref 0.44–1.00)
GFR, Estimated: 55 mL/min — ABNORMAL LOW (ref >60)
Glucose, Bld: 97 mg/dL (ref 70–99)
Potassium: 4.7 mmol/L (ref 3.5–5.1)
Sodium: 138 mmol/L (ref 135–145)
Total Bilirubin: 0.3 mg/dL (ref 0.0–1.2)
Total Protein: 8.1 g/dL (ref 6.5–8.1)

## 2023-10-08 LAB — URINALYSIS, COMPLETE (UACMP) WITH MICROSCOPIC
Bacteria, UA: NONE SEEN
Bilirubin Urine: NEGATIVE
Glucose, UA: NEGATIVE mg/dL
Hgb urine dipstick: NEGATIVE
Ketones, ur: NEGATIVE mg/dL
Leukocytes,Ua: NEGATIVE
Nitrite: NEGATIVE
Protein, ur: NEGATIVE mg/dL
Specific Gravity, Urine: 1.014 (ref 1.005–1.030)
pH: 6 (ref 5.0–8.0)

## 2023-10-08 LAB — MAGNESIUM: Magnesium: 2.1 mg/dL (ref 1.7–2.4)

## 2023-10-08 MED ORDER — OCTREOTIDE ACETATE 20 MG IM KIT
20.0000 mg | PACK | Freq: Once | INTRAMUSCULAR | Status: AC
Start: 2023-10-08 — End: 2023-10-08
  Administered 2023-10-08: 20 mg via INTRAMUSCULAR
  Filled 2023-10-08: qty 1

## 2023-10-08 NOTE — Progress Notes (Signed)
 [facility]  PROGRESS NOTE  Patient Care Team: [name], MD as PCP - General (Internal Medicine) Jakie Alm SAUNDERS, MD as Consulting Physician (Gastroenterology) [place], Heather, OD (Optometry) Livingston Rigg, MD as Consulting Physician (Dermatology)   CHIEF COMPLAINTS/PURPOSE OF CONSULTATION:  Metastatic neuroendocrine tumor involving the liver.   ONCOLOGIC HISTORY: CT abdomen and pelvis on 06/20/2023 showed multiple large heterogeneous liver lesions, largest measuring 10.6 cm in the right hepatic lobe and 5.7 cm in the left hepatic lobe.  Also noted was heterogeneous and has a mass in the superior pole of the right kidney measuring 3.5 cm, concerning for renal cell carcinoma.  Mesenteric mass or lymphadenopathy in the left central mesentery measuring 3.9 cm.  CT chest showed no evidence of metastatic disease in the chest. On 06/22/2023, she underwent ultrasound-guided biopsy of the liver lesion.  Pathology showed well-differentiated neuroendocrine tumor, grade 1, Ki-67 index of 2%.  The neoplastic cells were diffusely and strongly positive for the neuroendocrine markers chromogranin and synaptic ficin.  They were negative for cytokeratin 7 and cytokeratin 20.  The tumor exhibits focal patchy nonspecific positivity for PAX 8 which is of uncertain significance.   On 06/30/2023, chromogranin A was elevated at 1495 ng/mL.   On 07/09/2023, PET dotatate scan showed bilobar tracer avid liver metastases, large lesion measuring 10.7 x 8.5 cm in the right lobe, 6.4 x 3.8 cm in the left lobe.  Multiple tracer avid lymph nodes identified within the small bowel mesentery, compatible with nodal metastasis.  Focal area of increased radiotracer uptake localized to the loop of small bowel within the left lower quadrant, likely small bowel primary.  No uptake noted in the right kidney lesion.  Patient does not have carcinoid symptoms.  However given the burden of disease, decision was made to  proceed with octreotide  injections, which she started from 07/15/2023.  HISTORY OF PRESENTING ILLNESS:  Hannah Madden 89 y.o. female returns for follow-up for continued management for metastatic neuroendocrine tumor involving the liver.  She is accompanied by her daughter for this visit.  On exam today, Ms. Mullen reports she is tolerating treatment without any new or concerning symptoms.  Her energy levels are fairly stable and she is able to complete her baseline ADLs on her own.  She does have some acid reflux, nausea that is mainly present in the morning.  She suspects her diet is triggering some of her symptoms including eating tomatoes, drinking soda and coffee.  She takes Protonix  as needed along with Zofran .  She denies any vomiting episodes and her bowel habits are regular.  She denies easy bruising or signs of active bleeding.  Patient does have periodic episodes of sinusitis that is associated with drainage. She denies fevers, chills, sweats, shortness of breath, chest pain, cough, headaches, dizziness, palpitations or urinary symptoms. She has no other complaints. Rest of the ROS is below.   MEDICAL HISTORY:  Past Medical History:  Diagnosis Date   Alopecia    stress related and resolved since her Mother moved to SNF   Carotid artery occlusion    GERD (gastroesophageal reflux disease)    takes Nexium  daily   Glaucoma    uses eye drops daily   H/O hiatal hernia    Hypercholesteremia    takes Simvastatin  daily   Pneumonia 03/03/1958   Syncope 08/20/2020   UTI (urinary tract infection) 06/20/2023   Vitamin D  deficiency 12/24/2019    SURGICAL HISTORY: Past Surgical History:  Procedure Laterality Date  ABDOMINAL HYSTERECTOMY     fibroids.   COLONOSCOPY     KNEE ARTHROSCOPY WITH MEDIAL MENISECTOMY Left 09/12/2013   Procedure: KNEE ARTHROSCOPY WITH DEBRIDEMENT;  Surgeon: Cordella Glendia Hutchinson, MD;  Location: [facility] OR;  Service: Orthopedics;  Laterality: Left;    SOCIAL  HISTORY: Social History   Socioeconomic History   Marital status: Widowed    Spouse name: Not on file   Number of children: 4   Years of education: 16+   Highest education level: Not on file  Occupational History   Occupation: financial services    Comment: Museum/gallery exhibitions officer, now Chief Technology Officer  Tobacco Use   Smoking status: Never   Smokeless tobacco: Never  Vaping Use   Vaping status: Never Used  Substance and Sexual Activity   Alcohol  use: No   Drug use: No   Sexual activity: Yes    Partners: Female    Birth control/protection: Surgical  Other Topics Concern   Not on file  Social History Narrative   HS[employer], [employer], [employer], [employer] - no degree. Married '56. 2 sons - '64, '71; 2 dtrs - '57, '59; 6 grandchildren. 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 She goes to the gym regularly, bowls, takes classes. ACP - discussed the gift of Love of ACP.   Social Drivers of Corporate investment banker Strain: Not on file  Food Insecurity: No Food Insecurity (06/30/2023)   Hunger Vital Sign    Worried About Running Out of Food in the Last Year: Never true    Ran Out of Food in the Last Year: Never true  Transportation Needs: No Transportation Needs (06/30/2023)   PRAPARE - Administrator, Civil Service (Medical): No    Lack of Transportation (Non-Medical): No  Physical Activity: Not on file  Stress: Not on file  Social Connections: Moderately Integrated (06/20/2023)   Social Connection and Isolation Panel    Frequency of Communication with Friends and Family: Three times a week    Frequency of Social Gatherings with Friends and Family: More than three times a week    Attends Religious Services: More than 4 times per year    Active Member of Clubs or Organizations: Yes    Attends Banker Meetings: More than 4 times per year    Marital Status: Widowed  Intimate Partner  Violence: Not At Risk (06/30/2023)   Humiliation, Afraid, Rape, and Kick questionnaire    Fear of Current or Ex-Partner: No    Emotionally Abused: No    Physically Abused: No    Sexually Abused: No    FAMILY HISTORY: Family History  Problem Relation Age of Onset   COPD Neg Hx    Diabetes Neg Hx    Heart disease Neg Hx    Stroke Neg Hx    Colon cancer Neg Hx    Esophageal cancer Neg Hx    Pancreatic cancer Neg Hx    Stomach cancer Neg Hx     ALLERGIES:  is allergic to atorvastatin, macrobid [nitrofurantoin], penicillins, and ciprofloxacin .  MEDICATIONS:  Current Outpatient Medications  Medication Sig Dispense Refill   brimonidine -timolol  (COMBIGAN ) 0.2-0.5 % ophthalmic solution Place 1 drop into the right eye 2 (two) times daily.     brinzolamide  (AZOPT ) 1 % ophthalmic suspension Place 1 drop into the right eye 2 (two) times daily.     ondansetron  (ZOFRAN ) 4 MG tablet Take 1 tablet (4 mg total) by mouth  every 8 (eight) hours as needed for nausea or vomiting. 90 tablet 3   tobramycin  (TOBREX ) 0.3 % ophthalmic solution Place 1 drop into the right eye as needed (before eye injection).     docusate sodium  (COLACE) 100 MG capsule Take 1 capsule (100 mg total) by mouth 2 (two) times daily. (Patient not taking: Reported on 10/08/2023) 10 capsule 0   pantoprazole  (PROTONIX ) 40 MG tablet Take 1 tablet (40 mg total) by mouth daily. (Patient not taking: Reported on 10/08/2023) 90 tablet 3   No current facility-administered medications for this visit.   Facility-Administered Medications Ordered in Other Visits  Medication Dose Route Frequency Provider Last Rate Last Admin   octreotide  (SANDOSTATIN  LAR) IM injection 20 mg  20 mg Intramuscular Once Pasam, Avinash, MD        REVIEW OF SYSTEMS:   Constitutional: ( - ) fevers, ( - )  chills , ( - ) night sweats Eyes: ( - ) blurriness of vision, ( - ) double vision, ( - ) watery eyes Ears, nose, mouth, throat, and face: ( - ) mucositis, ( - ) sore  throat Respiratory: ( - ) cough, ( - ) dyspnea, ( - ) wheezes Cardiovascular: ( - ) palpitation, ( - ) chest discomfort, ( - ) lower extremity swelling Gastrointestinal:  ( + ) nausea, ( - ) heartburn, ( - ) change in bowel habits Skin: ( - ) abnormal skin rashes Lymphatics: ( - ) new lymphadenopathy, ( - ) easy bruising Neurological: ( - ) numbness, ( - ) tingling, ( - ) new weaknesses Behavioral/Psych: ( - ) mood change, ( - ) new changes  All other systems were reviewed with the patient and are negative.  PHYSICAL EXAMINATION: ECOG PERFORMANCE STATUS: 1 - Symptomatic but completely ambulatory  Vitals:   10/08/23 1000 10/08/23 1001  BP: (!) 153/82 (!) 152/71  Pulse: 74   Resp: 16   Temp: 97.8 F (36.6 C)   SpO2: 100%    Filed Weights   10/08/23 1000  Weight: 114 lb 6.4 oz (51.9 kg)    GENERAL: well appearing female in NAD  SKIN: skin color, texture, turgor are normal, no rashes or significant lesions EYES: conjunctiva are pink and non-injected, sclera clear OROPHARYNX: no exudate, no erythema; lips, buccal mucosa, and tongue normal  LUNGS: clear to auscultation and percussion with normal breathing effort HEART: regular rate & rhythm and no murmurs and no lower extremity edema ABDOMEN: soft, non-tender, non-distended, normal bowel sounds Musculoskeletal: no cyanosis of digits and no clubbing  PSYCH: alert & oriented x 3, fluent speech NEURO: no focal motor/sensory deficits  LABORATORY DATA:  I have reviewed the data as listed    Latest Ref Rng & Units 10/08/2023    9:45 AM 09/08/2023    2:41 PM 08/11/2023    2:40 PM  CBC  WBC 4.0 - 10.5 K/uL 12.0  7.3  8.6   Hemoglobin 12.0 - 15.0 g/dL 87.8  89.7  89.9   Hematocrit 36.0 - 46.0 % 37.6  31.9  30.9   Platelets 150 - 400 K/uL 242  242  229        Latest Ref Rng & Units 10/08/2023    9:45 AM 09/08/2023    2:41 PM 08/11/2023    2:40 PM  CMP  Glucose 70 - 99 mg/dL 97  91  92   BUN 8 - 23 mg/dL 25  16  17    Creatinine 0.44  - 1.00 mg/dL 8.99  0.91  0.85   Sodium 135 - 145 mmol/L 138  138  135   Potassium 3.5 - 5.1 mmol/L 4.7  4.6  4.9   Chloride 98 - 111 mmol/L 101  102  101   CO2 22 - 32 mmol/L 23  23  22    Calcium  8.9 - 10.3 mg/dL 89.9  9.7  9.7   Total Protein 6.5 - 8.1 g/dL 8.1  7.6  7.2   Total Bilirubin 0.0 - 1.2 mg/dL 0.3  0.5  0.2   Alkaline Phos 38 - 126 U/L 108  101  98   AST 15 - 41 U/L 34  23  21   ALT 0 - 44 U/L 19  14  10     RADIOGRAPHIC STUDIES: I have personally reviewed the radiological images as listed and agreed with the findings in the report. XR KNEE 3 VIEW RIGHT Result Date: 10/06/2023 AP lateral merchant radiographs right knee reviewed.  Moderate arthritis is present in the medial greater than lateral compartment.  No acute fracture.  Bone slightly osteopenic.  XR KNEE 3 VIEW LEFT Result Date: 10/06/2023 AP lateral merchant radiographs left knee reviewed.  Severe end-stage arthritis is present worse in the medial compartment.  No acute fracture.  Bones look slightly osteopenic.  Bone-on-bone changes noted most severely in the medial compartment   ASSESSMENT & PLAN Hannah Madden is a 89 y.o. female who presents to the clinic for continued management of neuroendocrine tumor involving the liver.  #Metastatic neuroendocrine tumor involving the liver: --Currently on monthly octreotide  injection started on 07/15/2023.  --Labs from today were reviewed and adequate for treatment. WBC 12.0, Hgb 12.1, Plt 242, creatinine and LFTs are normal. Chromogranin levels are pending.  --Proceed with treatment today --RTC in 4 weeks with labs and follow up prior to next octreotide  injection  #Acid reflux with nausea: --Advised to avoid trigger foods including caffeine, citric foods, tomatoes, etc.  --Patient takes PPI as needed. Advised to take daily if needed to help with symptoms. Advised to avoid sleeping or being in supine position 1-2 hours after eating.   #Leukocytosis: --No infectious  symptoms identified including fevers, chills, cough, urinary symptoms. --Will check UA to rule out UTI --Monitor closely and follow up if she develops infectious symptoms.    Orders Placed This Encounter  Procedures   Culture, Urine    Standing Status:   Future    Number of Occurrences:   1    Expiration Date:   10/07/2024   Urinalysis, Complete w Microscopic    Standing Status:   Future    Number of Occurrences:   1    Expiration Date:   10/07/2024    All questions were answered. The patient knows to call the clinic with any problems, questions or concerns.  I have spent a total of 30 minutes minutes of face-to-face and non-face-to-face time, preparing to see the patient,performing a medically appropriate examination, counseling and educating the patient, ordering medications/tests/procedures, documenting clinical information in the electronic health record, independently interpreting results and communicating results to the patient, and care coordination.   Johnston Police, PA-C Department of Hematology/Oncology [facility]

## 2023-10-08 NOTE — Progress Notes (Signed)
 Patient seen by Johnston Police  Vitals are within treatment parameters:Yes   Labs are within treatment parameters: Yes   Treatment plan has been signed: Yes   Per physician team, Patient is ready for treatment and there are NO modifications to the treatment plan.

## 2023-10-08 NOTE — Patient Instructions (Signed)
 Octreotide Injection Solution What is this medication? OCTREOTIDE (ok TREE oh tide) treats high levels of growth hormone (acromegaly). It works by reducing the amount of growth hormone your body makes. This reduces symptoms and the risk of health problems caused by too much growth hormone, such as diabetes and heart disease. It may also be used to treat diarrhea caused by neuroendocrine tumors. It works by slowing down the release of serotonin from the tumor cells. This reduces the number of bowel movements you have. This medicine may be used for other purposes; ask your health care provider or pharmacist if you have questions. COMMON BRAND NAME(S): Berline Lopes, Sandostatin What should I tell my care team before I take this medication? They need to know if you have any of these conditions: Diabetes Gallbladder disease Heart disease Kidney disease Liver disease Pancreatic disease Thyroid disease An unusual or allergic reaction to octreotide, other medications, foods, dyes, or preservatives Pregnant or trying to get pregnant Breastfeeding How should I use this medication? This medication is injected under the skin or into a vein. It is usually given by your care team in a hospital or clinic setting. If you get this medication at home, you will be taught how to prepare and give it. Use exactly as directed. Take it as directed on the prescription label at the same time every day. Keep taking it unless your care team tells you to stop. Allow the injection solution to come to room temperature before use. Do not warm it artificially. It is important that you put your used needles and syringes in a special sharps container. Do not put them in a trash can. If you do not have a sharps container, call your pharmacist or care team to get one. Talk to your care team about the use of this medication in children. Special care may be needed. Overdosage: If you think you have taken too much of this medicine  contact a poison control center or emergency room at once. NOTE: This medicine is only for you. Do not share this medicine with others. What if I miss a dose? If you miss a dose, take it as soon as you can. If it is almost time for your next dose, take only that dose. Do not take double or extra doses. What may interact with this medication? Bromocriptine Certain medications for blood pressure, heart disease, irregular heartbeat Cyclosporine Diuretics Medications for diabetes, including insulin Quinidine This list may not describe all possible interactions. Give your health care provider a list of all the medicines, herbs, non-prescription drugs, or dietary supplements you use. Also tell them if you smoke, drink alcohol, or use illegal drugs. Some items may interact with your medicine. What should I watch for while using this medication? Visit your care team for regular checks on your progress. Tell your care team if your symptoms do not start to get better or if they get worse. To help reduce irritation at the injection site, use a different site for each injection and make sure the solution is at room temperature before use. This medication may cause decreases in blood sugar. Signs of low blood sugar include chills, cool, pale skin or cold sweats, drowsiness, extreme hunger, fast heartbeat, headache, nausea, nervousness or anxiety, shakiness, trembling, unsteadiness, tiredness, or weakness. Contact your care team right away if you experience any of these symptoms. This medication may increase blood sugar. The risk may be higher in patients who already have diabetes. Ask your care team what you can  do to lower your risk of diabetes while taking this medication. You should make sure you get enough vitamin B12 while you are taking this medication. Discuss the foods you eat and the vitamins you take with your care team. What side effects may I notice from receiving this medication? Side effects that  you should report to your care team as soon as possible: Allergic reactions--skin rash, itching, hives, swelling of the face, lips, tongue, or throat Gallbladder problems--severe stomach pain, nausea, vomiting, fever Heart rhythm changes--fast or irregular heartbeat, dizziness, feeling faint or lightheaded, chest pain, trouble breathing High blood sugar (hyperglycemia)--increased thirst or amount of urine, unusual weakness or fatigue, blurry vision Low blood sugar (hypoglycemia)--tremors or shaking, anxiety, sweating, cold or clammy skin, confusion, dizziness, rapid heartbeat Low thyroid levels (hypothyroidism)--unusual weakness or fatigue, increased sensitivity to cold, constipation, hair loss, dry skin, weight gain, feelings of depression Low vitamin B12 level--pain, tingling, or numbness in the hands or feet, muscle weakness, dizziness, confusion, trouble concentrating Oily or light-colored stools, diarrhea, bloating, weight loss Pancreatitis--severe stomach pain that spreads to your back or gets worse after eating or when touched, fever, nausea, vomiting Slow heartbeat--dizziness, feeling faint or lightheaded, confusion, trouble breathing, unusual weakness or fatigue Side effects that usually do not require medical attention (report these to your care team if they continue or are bothersome): Diarrhea Dizziness Headache Nausea Pain, redness, or irritation at injection site Stomach pain This list may not describe all possible side effects. Call your doctor for medical advice about side effects. You may report side effects to FDA at 1-800-FDA-1088. Where should I keep my medication? Keep out of the reach of children and pets. Store in the refrigerator. Protect from light. Allow to come to room temperature naturally. Do not use artificial heat. If protected from light, the injection may be stored between 20 and 30 degrees C (70 and 86 degrees F) for 14 days. After the initial use, throw away  any unused portion of a multiple dose vial after 14 days. Get rid of any unused portions of the ampules after use. To get rid of medications that are no longer needed or have expired: Take the medication to a medication take-back program. Ask your pharmacy or law enforcement to find a location. If you cannot return the medication, ask your pharmacist or care team how to get rid of the medication safely. NOTE: This sheet is a summary. It may not cover all possible information. If you have questions about this medicine, talk to your doctor, pharmacist, or health care provider.  2024 Elsevier/Gold Standard (2023-01-30 00:00:00)

## 2023-10-09 ENCOUNTER — Other Ambulatory Visit: Payer: Self-pay

## 2023-10-09 ENCOUNTER — Encounter (HOSPITAL_COMMUNITY): Payer: Self-pay | Admitting: Radiology

## 2023-10-09 ENCOUNTER — Observation Stay (HOSPITAL_COMMUNITY)
Admission: EM | Admit: 2023-10-09 | Discharge: 2023-10-10 | Disposition: A | Discharge status: Home / Self Care | Attending: Internal Medicine | Admitting: Internal Medicine

## 2023-10-09 ENCOUNTER — Emergency Department (HOSPITAL_COMMUNITY)

## 2023-10-09 DIAGNOSIS — W19XXXA Unspecified fall, initial encounter: Secondary | ICD-10-CM | POA: Diagnosis not present

## 2023-10-09 DIAGNOSIS — R55 Syncope and collapse: Principal | ICD-10-CM | POA: Diagnosis present

## 2023-10-09 DIAGNOSIS — K769 Liver disease, unspecified: Secondary | ICD-10-CM | POA: Diagnosis not present

## 2023-10-09 DIAGNOSIS — R42 Dizziness and giddiness: Secondary | ICD-10-CM | POA: Diagnosis not present

## 2023-10-09 DIAGNOSIS — E041 Nontoxic single thyroid nodule: Secondary | ICD-10-CM | POA: Diagnosis not present

## 2023-10-09 DIAGNOSIS — R918 Other nonspecific abnormal finding of lung field: Secondary | ICD-10-CM | POA: Diagnosis not present

## 2023-10-09 DIAGNOSIS — R110 Nausea: Secondary | ICD-10-CM | POA: Diagnosis not present

## 2023-10-09 DIAGNOSIS — C7B02 Secondary carcinoid tumors of liver: Secondary | ICD-10-CM | POA: Insufficient documentation

## 2023-10-09 DIAGNOSIS — K219 Gastro-esophageal reflux disease without esophagitis: Secondary | ICD-10-CM | POA: Diagnosis not present

## 2023-10-09 DIAGNOSIS — I491 Atrial premature depolarization: Secondary | ICD-10-CM | POA: Diagnosis not present

## 2023-10-09 DIAGNOSIS — R262 Difficulty in walking, not elsewhere classified: Secondary | ICD-10-CM | POA: Insufficient documentation

## 2023-10-09 DIAGNOSIS — C799 Secondary malignant neoplasm of unspecified site: Secondary | ICD-10-CM | POA: Diagnosis not present

## 2023-10-09 LAB — URINALYSIS, ROUTINE W REFLEX MICROSCOPIC
Bilirubin Urine: NEGATIVE
Glucose, UA: NEGATIVE mg/dL
Hgb urine dipstick: NEGATIVE
Ketones, ur: NEGATIVE mg/dL
Leukocytes,Ua: NEGATIVE
Nitrite: NEGATIVE
Protein, ur: NEGATIVE mg/dL
Specific Gravity, Urine: 1.006 (ref 1.005–1.030)
pH: 6 (ref 5.0–8.0)

## 2023-10-09 LAB — D-DIMER, QUANTITATIVE: D-Dimer, Quant: 3.89 ug{FEU}/mL — ABNORMAL HIGH (ref 0.00–0.50)

## 2023-10-09 LAB — COMPREHENSIVE METABOLIC PANEL WITH GFR
ALT: 16 U/L (ref 0–44)
AST: 27 U/L (ref 15–41)
Albumin: 3.8 g/dL (ref 3.5–5.0)
Alkaline Phosphatase: 71 U/L (ref 38–126)
Anion gap: 11 (ref 5–15)
BUN: 23 mg/dL (ref 8–23)
CO2: 20 mmol/L — ABNORMAL LOW (ref 22–32)
Calcium: 8.3 mg/dL — ABNORMAL LOW (ref 8.9–10.3)
Chloride: 105 mmol/L (ref 98–111)
Creatinine, Ser: 0.75 mg/dL (ref 0.44–1.00)
GFR, Estimated: >60 mL/min (ref >60)
Glucose, Bld: 98 mg/dL (ref 70–99)
Potassium: 3.8 mmol/L (ref 3.5–5.1)
Sodium: 136 mmol/L (ref 135–145)
Total Bilirubin: 0.5 mg/dL (ref 0.0–1.2)
Total Protein: 6.7 g/dL (ref 6.5–8.1)

## 2023-10-09 LAB — URINE CULTURE: Culture: NO GROWTH

## 2023-10-09 LAB — CBC
HCT: 35.6 % — ABNORMAL LOW (ref 36.0–46.0)
Hemoglobin: 11.1 g/dL — ABNORMAL LOW (ref 12.0–15.0)
MCH: 28.7 pg (ref 26.0–34.0)
MCHC: 31.2 g/dL (ref 30.0–36.0)
MCV: 92 fL (ref 80.0–100.0)
Platelets: 114 K/uL — ABNORMAL LOW (ref 150–400)
RBC: 3.87 MIL/uL (ref 3.87–5.11)
RDW: 13.9 % (ref 11.5–15.5)
WBC: 10.1 K/uL (ref 4.0–10.5)
nRBC: 0 % (ref 0.0–0.2)

## 2023-10-09 LAB — TROPONIN I (HIGH SENSITIVITY)
Troponin I (High Sensitivity): 6 ng/L (ref <18)
Troponin I (High Sensitivity): 5 ng/L (ref <18)

## 2023-10-09 LAB — CBG MONITORING, ED: Glucose-Capillary: 87 mg/dL (ref 70–99)

## 2023-10-09 MED ORDER — SODIUM CHLORIDE (PF) 0.9 % IJ SOLN
INTRAMUSCULAR | Status: AC
  Filled 2023-10-09: qty 50

## 2023-10-09 MED ORDER — TIMOLOL MALEATE 0.5 % OP SOLN
1.0000 [drp] | Freq: Two times a day (BID) | OPHTHALMIC | Status: DC
  Administered 2023-10-09 – 2023-10-10 (×2): 1 [drp] via OPHTHALMIC
  Filled 2023-10-09: qty 5

## 2023-10-09 MED ORDER — BRIMONIDINE TARTRATE 0.2 % OP SOLN
1.0000 [drp] | Freq: Two times a day (BID) | OPHTHALMIC | Status: DC
  Administered 2023-10-09 – 2023-10-10 (×2): 1 [drp] via OPHTHALMIC
  Filled 2023-10-09: qty 5

## 2023-10-09 MED ORDER — TOBRAMYCIN 0.3 % OP SOLN: 1.0000 [drp] | Freq: Four times a day (QID) | OPHTHALMIC | Status: DC | PRN

## 2023-10-09 MED ORDER — SODIUM CHLORIDE 0.9 % IV BOLUS
1000.0000 mL | Freq: Once | INTRAVENOUS | Status: AC
  Administered 2023-10-09: 1000 mL via INTRAVENOUS

## 2023-10-09 MED ORDER — IOHEXOL 350 MG/ML SOLN
75.0000 mL | Freq: Once | INTRAVENOUS | Status: AC | PRN
  Administered 2023-10-09: 75 mL via INTRAVENOUS

## 2023-10-09 MED ORDER — BRIMONIDINE TARTRATE-TIMOLOL 0.2-0.5 % OP SOLN: 1.0000 [drp] | Freq: Two times a day (BID) | OPHTHALMIC | Status: DC

## 2023-10-09 MED ORDER — ACETAMINOPHEN 500 MG PO TABS
500.0000 mg | ORAL_TABLET | Freq: Four times a day (QID) | ORAL | Status: DC | PRN
  Filled 2023-10-09: qty 1

## 2023-10-09 MED ORDER — ENOXAPARIN SODIUM 40 MG/0.4ML IJ SOSY
40.0000 mg | PREFILLED_SYRINGE | INTRAMUSCULAR | Status: DC
  Administered 2023-10-09: 40 mg via SUBCUTANEOUS
  Filled 2023-10-09: qty 0.4

## 2023-10-09 MED ORDER — LORATADINE 10 MG PO TABS
10.0000 mg | ORAL_TABLET | Freq: Every day | ORAL | Status: DC | PRN
  Administered 2023-10-09: 10 mg via ORAL
  Filled 2023-10-09: qty 1

## 2023-10-09 MED ORDER — ONDANSETRON HCL 4 MG PO TABS
4.0000 mg | ORAL_TABLET | Freq: Three times a day (TID) | ORAL | Status: DC | PRN
  Administered 2023-10-10: 4 mg via ORAL
  Filled 2023-10-09: qty 1

## 2023-10-09 MED ORDER — PANTOPRAZOLE SODIUM 40 MG PO TBEC
40.0000 mg | DELAYED_RELEASE_TABLET | Freq: Every day | ORAL | Status: DC
  Administered 2023-10-09 – 2023-10-10 (×2): 40 mg via ORAL
  Filled 2023-10-09 (×2): qty 1

## 2023-10-09 MED ORDER — BRINZOLAMIDE 1 % OP SUSP
1.0000 [drp] | Freq: Two times a day (BID) | OPHTHALMIC | Status: DC
  Administered 2023-10-09 – 2023-10-10 (×2): 1 [drp] via OPHTHALMIC
  Filled 2023-10-09: qty 10

## 2023-10-09 MED ORDER — ENSURE PLUS HIGH PROTEIN PO LIQD
237.0000 mL | Freq: Two times a day (BID) | ORAL | Status: DC
  Administered 2023-10-10: 237 mL via ORAL

## 2023-10-09 NOTE — ED Provider Notes (Signed)
 [place] EMERGENCY DEPARTMENT AT [facility] Provider Note   CSN: [id number] Arrival date & time: 10/09/23  1144     Patient presents with: Dizziness   Hannah Madden is a 89 y.o. female.   HPI    89 year old patient comes in with chief complaint of dizziness. Patient has history of metastatic neuroendocrine tumor of the liver and carotid artery disease.  Patient states that today she had sudden episode of dizziness, with which she felt like she was going to faint.  Symptoms lasted for several minutes.  Patient was clammy according to the daughter who went to check on her.  Patient denies any associated chest pain, palpitations.  She had similar symptoms in April, when the diagnosis of cancer was made.  Patient subsequently has followed up with cardiologist, and was found to have carotid artery disease.  Patient indicates that similar symptoms have occurred in the past, but not to this extent.  She felt like she was going to die today.  Patient does not feel comfortable going home due to the severity of her symptoms.  Review of system negative for headaches, dizziness.  Prior to Admission medications   Medication Sig Start Date End Date Taking? Authorizing Provider  brimonidine -timolol  (COMBIGAN ) 0.2-0.5 % ophthalmic solution Place 1 drop into the right eye 2 (two) times daily.    [provider]  brinzolamide  (AZOPT ) 1 % ophthalmic suspension Place 1 drop into the right eye 2 (two) times daily.    [provider]  docusate sodium  (COLACE) 100 MG capsule Take 1 capsule (100 mg total) by mouth 2 (two) times daily. 06/23/23   Hannah Guadalajara, MD  ondansetron  (ZOFRAN ) 4 MG tablet Take 1 tablet (4 mg total) by mouth every 8 (eight) hours as needed for nausea or vomiting. 04/17/23   Hannah Madden ORN, MD  pantoprazole  (PROTONIX ) 40 MG tablet Take 1 tablet (40 mg total) by mouth daily. Patient not taking: Reported on 10/08/2023 04/17/23   Hannah Madden ORN, MD  tobramycin  (TOBREX )  0.3 % ophthalmic solution Place 1 drop into the right eye as needed (before eye injection). 03/08/19   [provider]    Allergies: Atorvastatin, Macrobid [nitrofurantoin], Penicillins, and Ciprofloxacin     Review of Systems  All other systems reviewed and are negative.   Updated Vital Signs BP (!) 147/74 (BP Location: Right Arm)   Pulse 80   Temp 97.6 F (36.4 C) (Oral)   Resp 10   SpO2 98%   Physical Exam Vitals and nursing note reviewed.  Constitutional:      Appearance: She is well-developed.  HENT:     Head: Atraumatic.  Eyes:     Extraocular Movements: Extraocular movements intact.     Pupils: Pupils are equal, round, and reactive to light.  Cardiovascular:     Rate and Rhythm: Normal rate.  Pulmonary:     Effort: Pulmonary effort is normal.  Musculoskeletal:     Cervical back: Normal range of motion and neck supple.  Skin:    General: Skin is warm and dry.  Neurological:     Mental Status: She is alert and oriented to person, place, and time.     (all labs ordered are listed, but only abnormal results are displayed) Labs Reviewed  COMPREHENSIVE METABOLIC PANEL WITH GFR - Abnormal; Notable for the following components:      Result Value   CO2 20 (*)    Calcium  8.3 (*)    All other components within normal  limits  CBC - Abnormal; Notable for the following components:   Hemoglobin 11.1 (*)    HCT 35.6 (*)    Platelets 114 (*)    All other components within normal limits  D-DIMER, QUANTITATIVE - Abnormal; Notable for the following components:   D-Dimer, Quant 3.89 (*)    All other components within normal limits  URINALYSIS, ROUTINE W REFLEX MICROSCOPIC  CBG MONITORING, ED  TROPONIN I (HIGH SENSITIVITY)  TROPONIN I (HIGH SENSITIVITY)    EKG: EKG Interpretation Date/Time:  Friday October 09 2023 11:56:27 EDT Ventricular Rate:  79 PR Interval:  136 QRS Duration:  93 QT Interval:  382 QTC Calculation: 438 R Axis:   39  Text  Interpretation: Sinus rhythm Atrial premature complexes RSR' in V1 or V2, right VCD or RVH No acute changes No significant change since last tracing Confirmed by Charlyn Sora [phone]) on 10/09/2023 1:01:09 PM  Radiology: No results found.   Procedures   Medications Ordered in the ED  sodium chloride  0.9 % bolus 1,000 mL (has no administration in time range)  iohexol  (OMNIPAQUE ) 350 MG/ML injection 75 mL (has no administration in time range)    Clinical Course as of 10/09/23 1631  Fri Oct 09, 2023  1516 Signout; near syncope. Neuroendocrine CA history. CTA pending. Plan for admission for syncope.  [TY]    Clinical Course User Index [TY] Neysa Caron PARAS, DO                                 Medical Decision Making Amount and/or Complexity of Data Reviewed Labs: ordered. Radiology: ordered.  Risk Prescription drug management.   89 year old patient with metastatic neuroendocrine cancer comes in with chief complaint of near fainting, dizziness and clamminess.  Symptoms were transient, lasted for few minutes.  Differential diagnosis considered for this patient includes: Cancer related adrenergic reaction Orthostatic hypotension Stroke Dysrhythmia PE Vasovagal/neurocardiogenic syncope Aortic stenosis Valvular disorder/Cardiomyopathy Anemia  I have reviewed records including patient's discharge summary from April.  Patient ;s daughter informs me, that back in April when she was admitted to the hospital, rapid response had to be called because patient had near fainting spell with concerns that she was going to arrest.  Patient has complaint of dizziness to her subsequently, but because of her normal cardiac workup they have not paid significant attention to her symptoms.  However, today the patient's symptoms were pretty pronounced.  She was clammy and patient told her that she felt like she was going to die.   Given this information, plan is to get basic labs, D-dimer, troponin.   Patient's initial EKG does show some right-sided strain.  If workup is negative, will admit patient for syncope observation. Patient's care has been signed out to incoming team.  Final diagnoses:  Near syncope    ED Discharge Orders     None          Charlyn Sora, MD 10/09/23 [phone]

## 2023-10-09 NOTE — H&P (Signed)
 History and Physical    Hannah Madden FMW:[id number] DOB: [birth year] DOA: 10/09/2023  I have briefly reviewed the patient's prior medical records in [facility]  PCP: [name], MD  Patient coming from: home  Chief Complaint: Presyncope, dizziness, generalized weakness  HPI: Hannah Madden is a 89 y.o. female with medical history significant of recently diagnosed metastatic neuroendocrine tumor of the liver, carotid artery disease comes into the hospital with sudden episode of dizziness, feeling like she was about to pass out.  She was also complaining of significant weakness and guided herself to the floor.  At 1 point she felt like she could not move her legs as they are very weak.  Daughter was home, and felt like the patient was clammy and not as responsive.  Symptoms lasted a few minutes.  She has had several episodes like this, including one few months ago, she was admitted and her cancer was diagnosed at that time.  Today's episode was the worst.  She denies any recent fever, chills, no chest pain, no shortness of breath.  No abdominal discomfort, nausea or vomiting or diarrhea.  ED Course: In the emergency room she is afebrile, blood pressure slightly on the high side in the 160s, blood work is essentially unremarkable.  She had a slightly elevated D-dimer for which she underwent a CT angiogram without evidence of PE  Review of Systems: All systems reviewed, and apart from HPI, all negative  Past Medical History:  Diagnosis Date   Alopecia    stress related and resolved since her Mother moved to SNF   Carotid artery occlusion    GERD (gastroesophageal reflux disease)    takes Nexium  daily   Glaucoma    uses eye drops daily   H/O hiatal hernia    Hypercholesteremia    takes Simvastatin  daily   Pneumonia 03/03/1958   Syncope 08/20/2020   UTI (urinary tract infection) 06/20/2023   Vitamin D  deficiency 12/24/2019    Past Surgical History:  Procedure Laterality  Date   ABDOMINAL HYSTERECTOMY     fibroids.   COLONOSCOPY     KNEE ARTHROSCOPY WITH MEDIAL MENISECTOMY Left 09/12/2013   Procedure: KNEE ARTHROSCOPY WITH DEBRIDEMENT;  Surgeon: Cordella Glendia Hutchinson, MD;  Location: [facility] OR;  Service: Orthopedics;  Laterality: Left;     reports that she has never smoked. She has never used smokeless tobacco. She reports that she does not drink alcohol  and does not use drugs.  Allergies  Allergen Reactions   Atorvastatin     REACTION: INTOL to Lipitor w/ leg pain   Macrobid [Nitrofurantoin] Other (See Comments)    diarrhea   Penicillins     REACTION: hives   Ciprofloxacin  Rash    Family History  Problem Relation Age of Onset   COPD Neg Hx    Diabetes Neg Hx    Heart disease Neg Hx    Stroke Neg Hx    Colon cancer Neg Hx    Esophageal cancer Neg Hx    Pancreatic cancer Neg Hx    Stomach cancer Neg Hx     Prior to Admission medications   Medication Sig Start Date End Date Taking? Authorizing Provider  acetaminophen  (TYLENOL ) 500 MG tablet Take 500 mg by mouth every 6 (six) hours as needed for moderate pain (pain score 4-6).   Yes [provider]  brimonidine -timolol  (COMBIGAN ) 0.2-0.5 % ophthalmic solution Place 1 drop into the right eye 2 (two) times daily.   Yes [provider]  brinzolamide  (AZOPT ) 1 % ophthalmic suspension Place 1 drop into the right eye 2 (two) times daily.   Yes [provider]  ondansetron  (ZOFRAN ) 4 MG tablet Take 1 tablet (4 mg total) by mouth every 8 (eight) hours as needed for nausea or vomiting. 04/17/23  Yes [name], MD  pantoprazole  (PROTONIX ) 40 MG tablet Take 1 tablet (40 mg total) by mouth daily. 04/17/23  Yes [name], MD  tobramycin  (TOBREX ) 0.3 % ophthalmic solution Place 1 drop into the right eye as needed (before eye injection). 03/08/19  Yes [provider]    Physical Exam: Vitals:   10/09/23 1522 10/09/23 1800 10/09/23 1815 10/09/23 1904  BP: (!) 147/74 (!) 161/73  (!) 166/73   Pulse: 80 78 78   Resp: 10 12 10    Temp: 97.6 F (36.4 C)   97.9 F (36.6 C)  TempSrc: Oral     SpO2: 98% 100% 100%       Constitutional: NAD, calm, comfortable Eyes: PERRL, lids and conjunctivae normal ENMT: Mucous membranes are moist. Posterior pharynx clear of any exudate or lesions.Normal dentition.  Neck: normal, supple Respiratory: clear to auscultation bilaterally, no wheezing, no crackles. Normal respiratory effort. No accessory muscle use.  Cardiovascular: Regular rate and rhythm, no murmurs / rubs / gallops. No extremity edema. 2+ pedal pulses.  Abdomen: no tenderness, no masses palpated. Bowel sounds positive.  Musculoskeletal: no clubbing / cyanosis. Normal muscle tone.  Skin: no rashes, lesions, ulcers. No induration Neurologic: CN 2-12 grossly intact. Strength 5/5 in all 4.  Psychiatric: Normal judgment and insight. Alert and oriented x 3. Normal mood.   Labs on Admission: I have personally reviewed following labs and imaging studies  CBC: Recent Labs  Lab 10/08/23 0945 10/09/23 1234  WBC 12.0* 10.1  NEUTROABS 7.4  --   HGB 12.1 11.1*  HCT 37.6 35.6*  MCV 89.7 92.0  PLT 242 114*   Basic Metabolic Panel: Recent Labs  Lab 10/08/23 0945 10/09/23 1234  NA 138 136  K 4.7 3.8  CL 101 105  CO2 23 20*  GLUCOSE 97 98  BUN 25* 23  CREATININE 1.00 0.75  CALCIUM  10.0 8.3*  MG 2.1  --    Liver Function Tests: Recent Labs  Lab 10/08/23 0945 10/09/23 1234  AST 34 27  ALT 19 16  ALKPHOS 108 71  BILITOT 0.3 0.5  PROT 8.1 6.7  ALBUMIN 5.0 3.8   Coagulation Profile: No results for input(s): INR, PROTIME in the last 168 hours. BNP (last 3 results) No results for input(s): PROBNP in the last 8760 hours. CBG: Recent Labs  Lab 10/09/23 1235  GLUCAP 87   Thyroid  Function Tests: No results for input(s): TSH, T4TOTAL, FREET4, T3FREE, THYROIDAB in the last 72 hours. Urine analysis:    Component Value Date/Time    COLORURINE STRAW (A) 10/09/2023 1549   APPEARANCEUR CLEAR 10/09/2023 1549   LABSPEC 1.006 10/09/2023 1549   PHURINE 6.0 10/09/2023 1549   GLUCOSEU NEGATIVE 10/09/2023 1549   GLUCOSEU NEGATIVE 06/24/2023 1613   HGBUR NEGATIVE 10/09/2023 1549   BILIRUBINUR NEGATIVE 10/09/2023 1549   BILIRUBINUR n 09/19/2013 0853   KETONESUR NEGATIVE 10/09/2023 1549   PROTEINUR NEGATIVE 10/09/2023 1549   UROBILINOGEN 0.2 06/24/2023 1613   NITRITE NEGATIVE 10/09/2023 1549   LEUKOCYTESUR NEGATIVE 10/09/2023 1549     Radiological Exams on Admission: CT Angio Chest PE W and/or Wo Contrast Result Date: 10/09/2023 EXAM: CTA of the Chest with contrast for  PE 10/09/2023 05:02:31 PM TECHNIQUE: CTA of the chest was performed after the administration of intravenous contrast. Multiplanar reformatted images are provided for review. MIP images are provided for review. Automated exposure control, iterative reconstruction, and/or weight based adjustment of the mA/kV was utilized to reduce the radiation dose to as low as reasonably achievable. COMPARISON: CT chest dated 06/20/2023. CLINICAL HISTORY: Pulmonary embolism (PE) suspected, high prob. History of metastatic neuroendocrine cancer with near fainting, dizziness, and clamminess. FINDINGS: PULMONARY ARTERIES: Pulmonary arteries are adequately opacified for evaluation. No pulmonary embolism. Main pulmonary artery is normal in caliber. MEDIASTINUM: Multiple bilateral thyroid  nodules measuring up to 1.5 cm on the right. The heart and pericardium demonstrate no acute abnormality. There is no acute abnormality of the thoracic aorta. Coronary artery calcifications and atherosclerosis. LYMPH NODES: No mediastinal, hilar or axillary lymphadenopathy. LUNGS AND PLEURA: Mild diffuse bronchial wall thickening. No focal consolidation or pulmonary edema. No pleural effusion or pneumothorax. UPPER ABDOMEN: Partially imaged multifocal hepatic lesions, some of which are hyperattenuating and others  hypoattenuating measuring up to 11.3 x 8.8 cm in the right hepatic lobe, in keeping with known malignancy. Partially imaged right upper pole exophytic renal mass, better evaluated on prior CT abdomen and pelvis. SOFT TISSUES AND BONES: No acute bone or soft tissue abnormality. IMPRESSION: 1. No evidence of pulmonary embolism. 2. Bilateral thyroid  nodules measuring up to 1.5 cm on the right. Recommend further evaluation with thyroid  ultrasound examination. 3. Multifocal hepatic lesions, in keeping with known malignancy. 4. Partially imaged right upper pole exophytic renal mass, better evaluated on prior CT abdomen and pelvis. Electronically signed by: Manford Cummins MD 10/09/2023 05:44 PM EDT RP Workstation: [id number]   EKG: Independently reviewed.  Sinus rhythm  Assessment/Plan Principal problem Presyncope, dizziness, weakness -patient has been having few episodes like this in the last couple of months.  She was admitted and worked up for this in the past with a 2D echocardiogram done in April 2025 which was fairly unremarkable.  Carotid ultrasound showed 50% CCA plaque, she is seeing vascular surgery and being monitored. -I do not see any brain imaging in the past 2 years, we will obtain an MRI to rule out other causes - I wonder whether this may be related to her underlying neuroendocrine tumor  Active problems Stage IV metastatic neuroendocrine tumor involving the liver-currently on octreotide , follows with oncology as an outpatient  Thyroid  nodules-up to 1.5 cm on the right, TSH has been checked earlier this year and it was normal.  Outpatient follow up  GERD -on PPI  DVT prophylaxis: Lovenox  Code Status: Full code Family Communication: Daughter present at bedside Disposition Plan: Home when ready Bed Type: Telemetry Consults called: None Obs/Inp: Observation   Nilda Fendt, MD, PhD [facility]  Contact via [email or web address]  10/09/2023, 7:23 PM

## 2023-10-09 NOTE — ED Triage Notes (Signed)
 BIBA for dizziness. Pt states she got dizzy and laid down on the floor. Pt denies CP, or SHOB, does have nausea. Pt has hx of CA and is currently being treated with a chemo shot. 170/90 BP 72 HR 140 cbg 20 g left forearm

## 2023-10-10 ENCOUNTER — Encounter (HOSPITAL_COMMUNITY): Payer: Self-pay | Admitting: Internal Medicine

## 2023-10-10 ENCOUNTER — Observation Stay (HOSPITAL_COMMUNITY)

## 2023-10-10 DIAGNOSIS — G319 Degenerative disease of nervous system, unspecified: Secondary | ICD-10-CM | POA: Diagnosis not present

## 2023-10-10 DIAGNOSIS — R4182 Altered mental status, unspecified: Secondary | ICD-10-CM | POA: Diagnosis not present

## 2023-10-10 DIAGNOSIS — R55 Syncope and collapse: Secondary | ICD-10-CM | POA: Diagnosis not present

## 2023-10-10 LAB — CBC
HCT: 32 % — ABNORMAL LOW (ref 36.0–46.0)
Hemoglobin: 10 g/dL — ABNORMAL LOW (ref 12.0–15.0)
MCH: 28.3 pg (ref 26.0–34.0)
MCHC: 31.3 g/dL (ref 30.0–36.0)
MCV: 90.7 fL (ref 80.0–100.0)
Platelets: 222 K/uL (ref 150–400)
RBC: 3.53 MIL/uL — ABNORMAL LOW (ref 3.87–5.11)
RDW: 13.7 % (ref 11.5–15.5)
WBC: 8 K/uL (ref 4.0–10.5)
nRBC: 0 % (ref 0.0–0.2)

## 2023-10-10 LAB — COMPREHENSIVE METABOLIC PANEL WITH GFR
ALT: 13 U/L (ref 0–44)
AST: 22 U/L (ref 15–41)
Albumin: 3.4 g/dL — ABNORMAL LOW (ref 3.5–5.0)
Alkaline Phosphatase: 56 U/L (ref 38–126)
Anion gap: 10 (ref 5–15)
BUN: 17 mg/dL (ref 8–23)
CO2: 23 mmol/L (ref 22–32)
Calcium: 8.8 mg/dL — ABNORMAL LOW (ref 8.9–10.3)
Chloride: 106 mmol/L (ref 98–111)
Creatinine, Ser: 0.83 mg/dL (ref 0.44–1.00)
GFR, Estimated: >60 mL/min (ref >60)
Glucose, Bld: 93 mg/dL (ref 70–99)
Potassium: 3.9 mmol/L (ref 3.5–5.1)
Sodium: 139 mmol/L (ref 135–145)
Total Bilirubin: 0.7 mg/dL (ref 0.0–1.2)
Total Protein: 6.3 g/dL — ABNORMAL LOW (ref 6.5–8.1)

## 2023-10-10 LAB — CHROMOGRANIN A: Chromogranin A (ng/mL): 1483 ng/mL — ABNORMAL HIGH (ref 0.0–101.8)

## 2023-10-10 LAB — MAGNESIUM: Magnesium: 2 mg/dL (ref 1.7–2.4)

## 2023-10-10 MED ORDER — PROMETHAZINE HCL 6.25 MG/5ML PO SOLN
12.5000 mg | ORAL | Status: DC | PRN
  Administered 2023-10-10: 12.5 mg via ORAL
  Filled 2023-10-10 (×2): qty 10

## 2023-10-10 MED ORDER — VITAMIN B-12 1000 MCG PO TABS: 1000.0000 ug | ORAL_TABLET | Freq: Every day | ORAL | 0 refills | Status: AC

## 2023-10-10 NOTE — Plan of Care (Signed)

## 2023-10-10 NOTE — Discharge Summary (Addendum)
 Physician Discharge Summary  Karsten Vaughn [facility] FMW:[id number] DOB: [birth year] DOA: 10/09/2023  PCP: [name], MD  Admit date: 10/09/2023 Discharge date: 10/10/2023  Admitted From: Home Disposition: Home  Recommendations for Outpatient Follow-up:  Follow up with PCP in 1-2 weeks Follow-up with oncology as scheduled  Home Health: Home health PT Equipment/Devices: Rolling walker, use  Discharge Condition: Stable CODE STATUS: Full Diet recommendation: As tolerated  Brief/Interim Summary: Hannah Madden is a 89 y.o. female with medical history significant of recently diagnosed metastatic neuroendocrine tumor of the liver, carotid artery disease comes into the hospital with acute onset weakness lowering herself to the floor but denies any overt syncope or mental status changes.  Symptoms lasted a few minutes at most.  She has had previous episodes similar to this in the past although not as severe as most recent episode.  Patient admitted as above with concern over orthostatic symptoms versus neuroendocrine tumor complications.  Patient's blood pressure while here continues to be somewhat labile, while orthostatics are negative she had systolic blood pressure ranged from 100-200.  Likely secondary to neuroendocrine tumor given recent and new metastatic process in the liver.  Recommend close follow-up with oncology outpatient for further treatment and evaluation, currently on octreotide  monthly.  Chromogranin A 1483(essentially stable over the past 3 months despite treatment).  Of note D-dimer was collected without complaints of lower extremity edema, hypoxia or tachycardia (CTA chest negative for PE) - likely elevated in the setting of metastatic disease, no indication at this time for further imaging or testing.   Discharge Diagnoses:  Principal Problem:   Pre-syncope  Presyncope, dizziness, weakness  - Likely secondary to neuroendocrine tumor with noted metastatic process -Blood pressure  remains labile as above likely complicating her symptoms - Close follow-up with oncology as discussed - Patient has noted 50% carotid artery stenosis with noted plaque -monitored by vascular surgery, likely unrelated to patient's symptoms - Previous echo unremarkable - MRI brain without acute findings  Stage IV metastatic neuroendocrine tumor involving the liver-currently on octreotide , follows with oncology as an outpatient   Thyroid  nodules-up to 1.5 cm on the right, TSH has been checked earlier this year and it was normal.  Outpatient follow up  GERD -on PPI  Discharge Instructions  Discharge Instructions     Call MD for:  difficulty breathing, headache or visual disturbances   Complete by: As directed    Call MD for:  extreme fatigue   Complete by: As directed    Call MD for:  hives   Complete by: As directed    Call MD for:  persistant dizziness or light-headedness   Complete by: As directed    Call MD for:  persistant nausea and vomiting   Complete by: As directed    Call MD for:  severe uncontrolled pain   Complete by: As directed    Call MD for:  temperature >100.4   Complete by: As directed    Diet - low sodium heart healthy   Complete by: As directed    Discharge instructions   Complete by: As directed    Follow-up with primary care team as well as oncology for further evaluation and treatment.  Continue to monitor blood pressure at home and make note of any markedly elevated or low blood pressures and symptoms.   Increase activity slowly   Complete by: As directed       Allergies as of 10/10/2023       Reactions   Atorvastatin  REACTION: INTOL to Lipitor w/ leg pain   Macrobid [nitrofurantoin] Other (See Comments)   diarrhea   Penicillins    REACTION: hives   Ciprofloxacin  Rash        Medication List     TAKE these medications    acetaminophen  500 MG tablet Commonly known as: TYLENOL  Take 500 mg by mouth every 6 (six) hours as needed for  moderate pain (pain score 4-6).   brimonidine -timolol  0.2-0.5 % ophthalmic solution Commonly known as: COMBIGAN  Place 1 drop into the right eye 2 (two) times daily.   brinzolamide  1 % ophthalmic suspension Commonly known as: AZOPT  Place 1 drop into the right eye 2 (two) times daily.   ondansetron  4 MG tablet Commonly known as: ZOFRAN  Take 1 tablet (4 mg total) by mouth every 8 (eight) hours as needed for nausea or vomiting.   pantoprazole  40 MG tablet Commonly known as: PROTONIX  Take 1 tablet (40 mg total) by mouth daily.   tobramycin  0.3 % ophthalmic solution Commonly known as: TOBREX  Place 1 drop into the right eye as needed (before eye injection).        Follow-up Information     [facility] Follow up.   Specialty: Home Health Services Why: This provider will reach out to you 24-48 hours after discharge to begin home health PT/OT services. Contact information: [address] [place] [address] [phone]                Allergies  Allergen Reactions   Atorvastatin     REACTION: INTOL to Lipitor w/ leg pain   Macrobid [Nitrofurantoin] Other (See Comments)    diarrhea   Penicillins     REACTION: hives   Ciprofloxacin  Rash    Consultations: None -reached out to on-call oncology -no recommendations  Procedures/Studies: MR BRAIN WO CONTRAST Result Date: 10/10/2023 EXAM: MRI BRAIN WITHOUT CONTRAST 10/10/2023 07:47:12 AM TECHNIQUE: Multiplanar multisequence MRI of the head/brain was performed without the administration of intravenous contrast. COMPARISON: None available. CLINICAL HISTORY: Mental status change, unknown cause. FINDINGS: BRAIN AND VENTRICLES: No acute infarct. No intracranial hemorrhage. No mass. No midline shift. No hydrocephalus. The sella is unremarkable. Normal flow voids. Mild atrophy and periventricular white matter changes likely reflect the sequelae of chronic microvascular ischemia. ORBITS: Bilateral lens  replacements are noted. The globes and orbits are otherwise within normal limits. SINUSES AND MASTOIDS: No acute abnormality. BONES AND SOFT TISSUES: Normal marrow signal. No acute soft tissue abnormality. IMPRESSION: 1. No acute intracranial abnormality. 2. Mild atrophy and periventricular white matter changes, likely sequelae of chronic microvascular ischemia. Electronically signed by: Lonni Necessary MD 10/10/2023 08:13 AM EDT RP Workstation: [id number]   CT Angio Chest PE W and/or Wo Contrast Result Date: 10/09/2023 EXAM: CTA of the Chest with contrast for PE 10/09/2023 05:02:31 PM TECHNIQUE: CTA of the chest was performed after the administration of intravenous contrast. Multiplanar reformatted images are provided for review. MIP images are provided for review. Automated exposure control, iterative reconstruction, and/or weight based adjustment of the mA/kV was utilized to reduce the radiation dose to as low as reasonably achievable. COMPARISON: CT chest dated 06/20/2023. CLINICAL HISTORY: Pulmonary embolism (PE) suspected, high prob. History of metastatic neuroendocrine cancer with near fainting, dizziness, and clamminess. FINDINGS: PULMONARY ARTERIES: Pulmonary arteries are adequately opacified for evaluation. No pulmonary embolism. Main pulmonary artery is normal in caliber. MEDIASTINUM: Multiple bilateral thyroid  nodules measuring up to 1.5 cm on the right. The heart and pericardium demonstrate no acute abnormality.  There is no acute abnormality of the thoracic aorta. Coronary artery calcifications and atherosclerosis. LYMPH NODES: No mediastinal, hilar or axillary lymphadenopathy. LUNGS AND PLEURA: Mild diffuse bronchial wall thickening. No focal consolidation or pulmonary edema. No pleural effusion or pneumothorax. UPPER ABDOMEN: Partially imaged multifocal hepatic lesions, some of which are hyperattenuating and others hypoattenuating measuring up to 11.3 x 8.8 cm in the right hepatic lobe, in  keeping with known malignancy. Partially imaged right upper pole exophytic renal mass, better evaluated on prior CT abdomen and pelvis. SOFT TISSUES AND BONES: No acute bone or soft tissue abnormality. IMPRESSION: 1. No evidence of pulmonary embolism. 2. Bilateral thyroid  nodules measuring up to 1.5 cm on the right. Recommend further evaluation with thyroid  ultrasound examination. 3. Multifocal hepatic lesions, in keeping with known malignancy. 4. Partially imaged right upper pole exophytic renal mass, better evaluated on prior CT abdomen and pelvis. Electronically signed by: Limin Xu MD 10/09/2023 05:44 PM EDT RP Workstation: [id number]   XR KNEE 3 VIEW RIGHT Result Date: 10/06/2023 AP lateral merchant radiographs right knee reviewed.  Moderate arthritis is present in the medial greater than lateral compartment.  No acute fracture.  Bone slightly osteopenic.  XR KNEE 3 VIEW LEFT Result Date: 10/06/2023 AP lateral merchant radiographs left knee reviewed.  Severe end-stage arthritis is present worse in the medial compartment.  No acute fracture.  Bones look slightly osteopenic.  Bone-on-bone changes noted most severely in the medial compartment    Subjective: No acute issues or events overnight   Discharge Exam: Vitals:   10/10/23 0406 10/10/23 0841  BP: 100/64 (!) 166/82  Pulse: 75 69  Resp: 16 14  Temp: 98.2 F (36.8 C) 97.7 F (36.5 C)  SpO2: 100% 100%   Vitals:   10/09/23 2003 10/10/23 0003 10/10/23 0406 10/10/23 0841  BP: (!) 162/84 127/71 100/64 (!) 166/82  Pulse: 82 71 75 69  Resp: 18  16 14   Temp: 98.4 F (36.9 C) 98.5 F (36.9 C) 98.2 F (36.8 C) 97.7 F (36.5 C)  TempSrc: Oral Oral Oral Oral  SpO2: 100% 100% 100% 100%  Weight:      Height:        General: Pt is alert, awake, not in acute distress Cardiovascular: RRR, S1/S2 +, no rubs, no gallops Respiratory: CTA bilaterally, no wheezing, no rhonchi Abdominal: Soft, NT, ND, bowel sounds + Extremities: no edema, no  cyanosis    The results of significant diagnostics from this hospitalization (including imaging, microbiology, ancillary and laboratory) are listed below for reference.     Microbiology: Recent Results (from the past 240 hours)  Culture, Urine     Status: None   Collection Time: 10/08/23 11:28 AM   Specimen: Urine, Clean Catch  Result Value Ref Range Status   Specimen Description   Final    URINE, CLEAN CATCH Performed at [facility], [address] [place], [place] [address]    Special Requests   Final    NONE Performed at [facility], [address] [place], [place] [address]    Culture   Final    NO GROWTH Performed at [facility], [address] [place], [place] [address]    Report Status 10/09/2023 FINAL  Final     Labs: BNP (last 3 results) No results for input(s): BNP in the last 8760 hours. Basic Metabolic Panel: Recent Labs  Lab 10/08/23 0945 10/09/23 1234 10/10/23 0634  NA 138 136 139  K 4.7 3.8 3.9  CL 101 105 106  CO2 23 20* 23  GLUCOSE 97 98 93  BUN 25* 23 17  CREATININE 1.00 0.75 0.83  CALCIUM  10.0 8.3* 8.8*  MG 2.1  --  2.0   Liver Function Tests: Recent Labs  Lab 10/08/23 0945 10/09/23 1234 10/10/23 0634  AST 34 27 22  ALT 19 16 13   ALKPHOS 108 71 56  BILITOT 0.3 0.5 0.7  PROT 8.1 6.7 6.3*  ALBUMIN 5.0 3.8 3.4*   No results for input(s): LIPASE, AMYLASE in the last 168 hours. No results for input(s): AMMONIA in the last 168 hours. CBC: Recent Labs  Lab 10/08/23 0945 10/09/23 1234 10/10/23 0634  WBC 12.0* 10.1 8.0  NEUTROABS 7.4  --   --   HGB 12.1 11.1* 10.0*  HCT 37.6 35.6* 32.0*  MCV 89.7 92.0 90.7  PLT 242 114* 222   Cardiac Enzymes: No results for input(s): CKTOTAL, CKMB, CKMBINDEX, TROPONINI in the last 168 hours. BNP: Invalid input(s): POCBNP CBG: Recent Labs  Lab 10/09/23 1235  GLUCAP 87   D-Dimer Recent Labs    10/09/23 1445  DDIMER  3.89*   Hgb A1c No results for input(s): HGBA1C in the last 72 hours. Lipid Profile No results for input(s): CHOL, HDL, LDLCALC, TRIG, CHOLHDL, LDLDIRECT in the last 72 hours. Thyroid  function studies No results for input(s): TSH, T4TOTAL, T3FREE, THYROIDAB in the last 72 hours.  Invalid input(s): FREET3 Anemia work up No results for input(s): VITAMINB12, FOLATE, FERRITIN, TIBC, IRON, RETICCTPCT in the last 72 hours. Urinalysis    Component Value Date/Time   COLORURINE STRAW (A) 10/09/2023 1549   APPEARANCEUR CLEAR 10/09/2023 1549   LABSPEC 1.006 10/09/2023 1549   PHURINE 6.0 10/09/2023 1549   GLUCOSEU NEGATIVE 10/09/2023 1549   GLUCOSEU NEGATIVE 06/24/2023 1613   HGBUR NEGATIVE 10/09/2023 1549   BILIRUBINUR NEGATIVE 10/09/2023 1549   BILIRUBINUR n 09/19/2013 0853   KETONESUR NEGATIVE 10/09/2023 1549   PROTEINUR NEGATIVE 10/09/2023 1549   UROBILINOGEN 0.2 06/24/2023 1613   NITRITE NEGATIVE 10/09/2023 1549   LEUKOCYTESUR NEGATIVE 10/09/2023 1549   Sepsis Labs Recent Labs  Lab 10/08/23 0945 10/09/23 1234 10/10/23 0634  WBC 12.0* 10.1 8.0   Microbiology Recent Results (from the past 240 hours)  Culture, Urine     Status: None   Collection Time: 10/08/23 11:28 AM   Specimen: Urine, Clean Catch  Result Value Ref Range Status   Specimen Description   Final    URINE, CLEAN CATCH Performed at [facility], [address] [place], [place] [address]    Special Requests   Final    NONE Performed at [facility], [address] [place], [place] [address]    Culture   Final    NO GROWTH Performed at [facility], [address] [place], [place] [address]    Report Status 10/09/2023 FINAL  Final     Time coordinating discharge: Over 30 minutes  SIGNED:   Elsie JAYSON [place], DO [facility] 10/10/2023, 1:23 PM Pager   If 7PM-7AM, please contact  night-coverage [email or web address]

## 2023-10-10 NOTE — TOC Transition Note (Signed)
 Transition of Care [facility]) - Discharge Note   Patient Details  Name: Hannah Madden MRN: [id number] Date of Birth: 04-07-1934  Transition of Care [facility]) CM/SW Contact:  Heather DELENA Saltness, LCSW Phone Number: 10/10/2023, 12:10 PM   Clinical Narrative:    Pt discharging home today. PT recommended [facility] PT/OT services and youth RW upon discharge. HH PT/OT set up with [facility]. Youth RW ordered through [employer], to be delivered to bedside. No further TOC needs at this time.   Final next level of care: Home w Home Health Services Barriers to Discharge: Barriers Resolved   Patient Goals and CMS Choice Patient states their goals for this hospitalization and ongoing recovery are:: To return home          Discharge Placement  Home              Patient to be transferred to facility by: Daughter Name of family member notified: Pt and daughter Patient and family notified of of transfer: 10/10/23  Discharge Plan and Services Additional resources added to the After Visit Summary for  [facility]                DME Arranged: Vannie rolling DME Agency: [employer] Date DME Agency Contacted: 10/10/23 Time DME Agency Contacted: 1209 Representative spoke with at DME Agency: [place] HH Arranged: PT, OT HH Agency: [facility] Date [facility] Agency Contacted: 10/10/23 Time HH Agency Contacted: 1209 Representative spoke with at [facility] Agency: Cindie  Social Drivers of Health (SDOH) Interventions SDOH Screenings   Food Insecurity: No Food Insecurity (10/10/2023)  Housing: Low Risk  (10/10/2023)  Transportation Needs: No Transportation Needs (10/10/2023)  Utilities: Not At Risk (10/10/2023)  Depression (PHQ2-9): Low Risk  (10/08/2023)  Social Connections: Moderately Integrated (10/10/2023)  Tobacco Use: Low Risk  (10/10/2023)     Readmission Risk Interventions    06/22/2023    1:55 PM  Readmission Risk Prevention Plan  Post Dischage Appt Complete  Medication Screening Complete   Transportation Screening Complete    Signed: Heather Saltness, MSW, LCSW Clinical Social Worker Inpatient Care Management 10/10/2023 12:11 PM

## 2023-10-10 NOTE — Evaluation (Signed)
 Physical Therapy Evaluation Patient Details Name: Hannah Madden MRN: [id number] DOB: 1935-02-24 Today's Date: 10/10/2023  History of Present Illness  Hannah Madden is a 89 y.o. female comes into the hospital with sudden episode of dizziness, feeling like she was about to pass out.  She was also complaining of significant weakness and guided herself to the floor.  At 1 point she felt like she could not move her legs as they are very weak Admitted for obs with dx of presyncope, weakness and dizziness PMH:  recently diagnosed metastatic neuroendocrine tumor of the liver, carotid artery disease, GERD, glaucoma, PNA, UTI, hx of hiatal hernia  Clinical Impression  Per patient (limited historian d/t decreased STM) patient lives alone and independent with daughter recently staying with her. Currently, patient presents with deficits outlined below (see PT Problem List for details) most significantly generalized weakness, reports of fatigue and nausea impacting activity tolerance, decreased balance and cognitive deficits limiting safety and functional mobility.  As session evolved, STM deficits and decreased safety became more apparent during assessment. Patient requires continued Acute care hospital level PT services to progress safety and functional performance and allow for discharge. Patient will require supervision upon discharge with assist for higher level IADL's and medication management with HHPT services recommended with use of RW for safe functional mobility.       If plan is discharge home, recommend the following: A little help with walking and/or transfers;A little help with bathing/dressing/bathroom;Assistance with cooking/housework;Assist for transportation;Help with stairs or ramp for entrance   Can travel by private vehicle        Equipment Recommendations Rolling walker (2 wheels) (youth level)  Recommendations for Other Services       Functional Status Assessment Patient has had  a recent decline in their functional status and demonstrates the ability to make significant improvements in function in a reasonable and predictable amount of time.     Precautions / Restrictions Precautions Precautions: Fall Recall of Precautions/Restrictions: Impaired Precaution/Restrictions Comments: check orthostatic vitals Restrictions Weight Bearing Restrictions Per Provider Order: No      Mobility  Bed Mobility Overal bed mobility: Needs Assistance Bed Mobility: Supine to Sit, Sit to Supine     Supine to sit: HOB elevated, Used rails, Supervision Sit to supine: HOB elevated, Used rails, Supervision   General bed mobility comments: min cues for focus on task    Transfers Overall transfer level: Needs assistance Equipment used: Rolling walker (2 wheels) Transfers: Sit to/from Stand, Bed to chair/wheelchair/BSC Sit to Stand: Contact guard assist   Step pivot transfers: Contact guard assist, From elevated surface       General transfer comment: Steady assist with cues for hand placement and for safety to decelerate sit to stand with UE support    Ambulation/Gait Ambulation/Gait assistance: Contact guard assist Gait Distance (Feet): 30 Feet (15' twice to/from bathroom) Assistive device: Rolling walker (2 wheels) Gait Pattern/deviations: Step-to pattern, Step-through pattern, Decreased step length - right, Decreased step length - left, Shuffle, Trunk flexed Gait velocity: decr     General Gait Details: CGA for safety with cues for posture and position from RW; distance ltd by nausea  Stairs            Wheelchair Mobility     Tilt Bed    Modified Rankin (Stroke Patients Only)       Balance Overall balance assessment: Mild deficits observed, not formally tested  Pertinent Vitals/Pain Pain Assessment Pain Assessment: No/denies pain    Home Living Family/patient expects to be discharged  to:: Private residence Living Arrangements: Alone Available Help at Discharge: Family;Available 24 hours/day Type of Home: House Home Access: Stairs to enter Entrance Stairs-Rails: Doctor, general practice of Steps: 12   Home Layout: Two level;Able to live on main level with bedroom/bathroom Home Equipment: Shower seat Additional Comments: patient had forgotten daughter is now staying with her    Prior Function Prior Level of Function : Independent/Modified Independent             Mobility Comments: reports no AD but limited historian ADLs Comments: reports indep with ADL's and IADL's but question STM for history     Extremity/Trunk Assessment   Upper Extremity Assessment Upper Extremity Assessment: Generalized weakness    Lower Extremity Assessment Lower Extremity Assessment: Generalized weakness    Cervical / Trunk Assessment Cervical / Trunk Assessment: Normal  Communication   Communication Communication: No apparent difficulties    Cognition Arousal: Alert Behavior During Therapy: WFL for tasks assessed/performed                             Following commands: Intact       Cueing Cueing Techniques: Verbal cues     General Comments General comments (skin integrity, edema, etc.): limited by nausea and fatigue orthostatic vitals as follows: HR 88 BP R UE supine, supine after 5 minutes 184/96; 175/83, HR 78; sitting BP 166 84 HR 78; Standing BP  151/83  HR 82 3 minutes standing  BP 155/82  HR 96    Exercises     Assessment/Plan    PT Assessment Patient needs continued PT services  PT Problem List Decreased strength;Decreased range of motion;Decreased activity tolerance;Decreased balance;Decreased mobility;Decreased knowledge of use of DME       PT Treatment Interventions DME instruction;Gait training;Stair training;Functional mobility training;Therapeutic activities;Therapeutic exercise;Patient/family education    PT Goals (Current  goals can be found in the Care Plan section)  Acute Rehab PT Goals Patient Stated Goal: Regain IND and overcome nausea PT Goal Formulation: With patient Time For Goal Achievement: 10/24/23 Potential to Achieve Goals: Good    Frequency Min 3X/week     Co-evaluation PT/OT/SLP Co-Evaluation/Treatment: Yes Reason for Co-Treatment: For patient/therapist safety PT goals addressed during session: Mobility/safety with mobility;Balance;Proper use of DME OT goals addressed during session: ADL's and self-care;Proper use of Adaptive equipment and DME       AM-PAC PT 6 Clicks Mobility  Outcome Measure Help needed turning from your back to your side while in a flat bed without using bedrails?: None Help needed moving from lying on your back to sitting on the side of a flat bed without using bedrails?: None Help needed moving to and from a bed to a chair (including a wheelchair)?: A Little Help needed standing up from a chair using your arms (e.g., wheelchair or bedside chair)?: A Little Help needed to walk in hospital room?: A Little Help needed climbing 3-5 steps with a railing? : A Lot 6 Click Score: 19    End of Session Equipment Utilized During Treatment: Gait belt Activity Tolerance: Other (comment) (limited by nausea) Patient left: in chair;with call bell/phone within reach;with chair alarm set Nurse Communication: Mobility status PT Visit Diagnosis: Difficulty in walking, not elsewhere classified (R26.2)    Time: 8991-8944 PT Time Calculation (min) (ACUTE ONLY): 47 min   Charges:   PT Evaluation $  PT Eval Low Complexity: 1 Low   PT General Charges $$ ACUTE PT VISIT: 1 Visit         [facility] PT Acute Rehabilitation Services Office [phone]   Houa Ackert 10/10/2023, 12:48 PM

## 2023-10-10 NOTE — Plan of Care (Signed)
   Problem: Education: Goal: Knowledge of General Education information will improve Description Including pain rating scale, medication(s)/side effects and non-pharmacologic comfort measures Outcome: Progressing

## 2023-10-10 NOTE — Evaluation (Addendum)
 Occupational Therapy Evaluation Patient Details Name: Hannah Madden MRN: [id number] DOB: 1935/03/02 Today's Date: 10/10/2023   History of Present Illness   Hannah Madden is a 89 y.o. female comes into the hospital with sudden episode of dizziness, feeling like she was about to pass out.  She was also complaining of significant weakness and guided herself to the floor.  At 1 point she felt like she could not move her legs as they are very weak Admitted for obs with dx of presyncope, weakness and dizziness PMH:  recently diagnosed metastatic neuroendocrine tumor of the liver, carotid artery disease, GERD, glaucoma, PNA, UTI, hx of hiatal hernia     Clinical Impressions Per patient (limited historian d/t decreased STM) patient lives alone and independent with daughter recently staying with her. Currently, patient presents with deficits outlined below (see OT Problem List for details) most significantly generalized weakness, reports of fatigue and nausea impacting activity tolerance, decreased balance and cognitive deficits limiting BADL's and functional mobility.  As session evolved, STM deficits and decreased safety became more apparent during assessment. Patient requires continued Acute care hospital level OT services to progress safety and functional performance and allow for discharge. Patient will require supervision upon discharge with assist for higher level IADL's and medication management with HHOT services recommended with use of RW for safe functional mobility.    VSR: Orthostatic readings as follows- R 88 BP R UE supine, supine after 5 minutes 184/96; 175/83, HR 78; sitting BP 166 84 HR 78; Standing BP  151/83  HR 82;  3 minutes standing BP 155/82  HR 96     If plan is discharge home, recommend the following:   A little help with walking and/or transfers;A little help with bathing/dressing/bathroom;Assistance with cooking/housework;Direct supervision/assist for medications  management;Direct supervision/assist for financial management;Assist for transportation;Help with stairs or ramp for entrance;Supervision due to cognitive status     Functional Status Assessment   Patient has had a recent decline in their functional status and demonstrates the ability to make significant improvements in function in a reasonable and predictable amount of time.     Equipment Recommendations   None recommended by OT;Other (comment) (other than RW)      Precautions/Restrictions   Precautions Precautions: Fall Recall of Precautions/Restrictions: Impaired Precaution/Restrictions Comments: check orthostatic vitals Restrictions Weight Bearing Restrictions Per Provider Order: No     Mobility Bed Mobility Overal bed mobility: Needs Assistance Bed Mobility: Supine to Sit, Sit to Supine     Supine to sit: HOB elevated, Used rails, Supervision Sit to supine: HOB elevated, Used rails, Supervision   General bed mobility comments: min cues for focus on task    Transfers Overall transfer level: Needs assistance Equipment used: Rolling walker (2 wheels) Transfers: Sit to/from Stand, Bed to chair/wheelchair/BSC Sit to Stand: Contact guard assist     Step pivot transfers: Contact guard assist, From elevated surface     General transfer comment: cues for hand placement and for safety to decelerate sit to stand with UE support, cues for directionality and RW integration amb to and from bathroom to chair      Balance Overall balance assessment: Mild deficits observed, not formally tested                                         ADL either performed or assessed with clinical judgement   ADL Overall ADL's :  Needs assistance/impaired Eating/Feeding: Independent   Grooming: Wash/dry hands;Wash/dry face;Oral care;Set up;Sitting   Upper Body Bathing: Set up;Sitting   Lower Body Bathing: Contact guard assist;Sit to/from stand   Upper Body  Dressing : Set up;Sitting   Lower Body Dressing: Contact guard assist;Sit to/from stand;Cueing for sequencing;Cueing for safety   Toilet Transfer: Contact guard assist;Regular Toilet;Rolling walker (2 wheels);Cueing for safety;Cueing for sequencing   Toileting- Clothing Manipulation and Hygiene: Set up;Sitting/lateral lean       Functional mobility during ADLs: Contact guard assist;Cueing for sequencing;Cueing for safety;Rolling walker (2 wheels) General ADL Comments: needs redirection to focus on task at hand     Vision Baseline Vision/History: 0 No visual deficits              Pertinent Vitals/Pain Pain Assessment Pain Assessment: No/denies pain     Extremity/Trunk Assessment Upper Extremity Assessment Upper Extremity Assessment: Generalized weakness;Right hand dominant   Lower Extremity Assessment Lower Extremity Assessment: Defer to PT evaluation   Cervical / Trunk Assessment Cervical / Trunk Assessment: Normal   Communication Communication Communication: No apparent difficulties   Cognition Arousal: Alert Behavior During Therapy: WFL for tasks assessed/performed Cognition: Cognition impaired   Orientation impairments: Situation, Time Awareness: Intellectual awareness impaired, Online awareness impaired Memory impairment (select all impairments): Short-term memory Attention impairment (select first level of impairment): Sustained attention (needs cues to stay on basic tasks due to increased conversation) Executive functioning impairment (select all impairments): Problem solving, Reasoning OT - Cognition Comments: decreased insignt into deficits and clarity for course of hospitalization and even plof , decreased higher level safety and carryover                 Following commands: Intact       Cueing  General Comments   Cueing Techniques: Verbal cues  HR 88 BP R UE supine, supine after 5 minutes 184/96; 175/83, HR 78; sitting BP 166 84 HR 78;  Standing BP  151/83  HR 82 3 minutes standing BP 155/82  HR 96           Home Living Family/patient expects to be discharged to:: Private residence Living Arrangements: Alone Available Help at Discharge: Family;Available 24 hours/day Type of Home: House Home Access: Stairs to enter [employer] of Steps: 12 Entrance Stairs-Rails: Right;Left Home Layout: Two level;Able to live on main level with bedroom/bathroom     Bathroom Shower/Tub: Walk-in shower;Door   [employer] Toilet: Standard     Home Equipment: TEFL teacher Comments: patient had forgotten daughter is now staying with her      Prior Functioning/Environment Prior Level of Function : Independent/Modified Independent             Mobility Comments: reports no AD but limited historian ADLs Comments: reports indep with ADL's and IADL's but question STM for history    OT Problem List: Decreased strength;Decreased activity tolerance;Impaired balance (sitting and/or standing);Decreased cognition;Decreased safety awareness;Decreased knowledge of use of DME or AE;Decreased knowledge of precautions;Cardiopulmonary status limiting activity   OT Treatment/Interventions: Self-care/ADL training;Therapeutic exercise;Neuromuscular education;Energy conservation;DME and/or AE instruction;Therapeutic activities;Cognitive remediation/compensation;Patient/family education;Balance training      OT Goals(Current goals can be found in the care plan section)   Acute Rehab OT Goals Patient Stated Goal: to feel stronger OT Goal Formulation: With patient Time For Goal Achievement: 10/24/23 Potential to Achieve Goals: Good ADL Goals Pt Will Perform Lower Body Bathing: with modified independence;sit to/from stand Pt Will Perform Lower Body Dressing: with modified independence;sit to/from stand Pt  Will Transfer to Toilet: with modified independence;ambulating;regular height toilet Pt Will Perform Toileting -  Clothing Manipulation and hygiene: with modified independence;sit to/from stand Pt Will Perform Tub/Shower Transfer: Shower transfer;with modified independence;shower seat   OT Frequency:  Min 2X/week    Co-evaluation PT/OT/SLP Co-Evaluation/Treatment: Yes Reason for Co-Treatment: For patient/therapist safety PT goals addressed during session: Mobility/safety with mobility;Balance;Proper use of DME OT goals addressed during session: ADL's and self-care;Proper use of Adaptive equipment and DME      AM-PAC OT 6 Clicks Daily Activity     Outcome Measure Help from another person eating meals?: None Help from another person taking care of personal grooming?: None Help from another person toileting, which includes using toliet, bedpan, or urinal?: A Little Help from another person bathing (including washing, rinsing, drying)?: A Little Help from another person to put on and taking off regular upper body clothing?: None Help from another person to put on and taking off regular lower body clothing?: A Little 6 Click Score: 21   End of Session Equipment Utilized During Treatment: Gait belt;Rolling walker (2 wheels) Nurse Communication: Mobility status;Other (comment) (need for nausea meds)  Activity Tolerance: Patient limited by fatigue;Other (comment) (nausea) Patient left: in bed;with call bell/phone within reach  OT Visit Diagnosis: History of falling (Z91.81);Unsteadiness on feet (R26.81);Muscle weakness (generalized) (M62.81);Cognitive communication deficit (R41.841) Symptoms and signs involving cognitive functions: Other cerebrovascular disease                Time: 8993-8956 OT Time Calculation (min): 37 min Charges:  OT Evaluation $OT Eval Low Complexity: 1 Low Mylo Driskill OT/L Acute Rehabilitation Department  [phone]  10/10/2023, 12:09 PM

## 2023-10-12 ENCOUNTER — Telehealth: Payer: Self-pay

## 2023-10-12 DIAGNOSIS — H409 Unspecified glaucoma: Secondary | ICD-10-CM | POA: Diagnosis not present

## 2023-10-12 DIAGNOSIS — G319 Degenerative disease of nervous system, unspecified: Secondary | ICD-10-CM | POA: Diagnosis not present

## 2023-10-12 DIAGNOSIS — E7800 Pure hypercholesterolemia, unspecified: Secondary | ICD-10-CM | POA: Diagnosis not present

## 2023-10-12 DIAGNOSIS — E042 Nontoxic multinodular goiter: Secondary | ICD-10-CM | POA: Diagnosis not present

## 2023-10-12 DIAGNOSIS — C7B8 Other secondary neuroendocrine tumors: Secondary | ICD-10-CM | POA: Diagnosis not present

## 2023-10-12 DIAGNOSIS — D849 Immunodeficiency, unspecified: Secondary | ICD-10-CM | POA: Diagnosis not present

## 2023-10-12 DIAGNOSIS — I6529 Occlusion and stenosis of unspecified carotid artery: Secondary | ICD-10-CM | POA: Diagnosis not present

## 2023-10-12 DIAGNOSIS — E559 Vitamin D deficiency, unspecified: Secondary | ICD-10-CM | POA: Diagnosis not present

## 2023-10-12 DIAGNOSIS — K219 Gastro-esophageal reflux disease without esophagitis: Secondary | ICD-10-CM | POA: Diagnosis not present

## 2023-10-12 NOTE — Telephone Encounter (Signed)
 Copied from CRM [id number]. Topic: Clinical - Home Health Verbal Orders >> Oct 12, 2023  3:28 PM Harlene ORN wrote: Caller/Agency: christine -  [facility] Callback Number: [phone] (secure voicemail) Service Requested: Occupational Therapy, and home health aid and speech therapy Frequency: once a week for 5 weeks Any new concerns about the patient? No

## 2023-10-12 NOTE — Telephone Encounter (Signed)
 Submitted

## 2023-10-12 NOTE — Telephone Encounter (Signed)
 VOB submitted for [employer], left knee

## 2023-10-13 DIAGNOSIS — G319 Degenerative disease of nervous system, unspecified: Secondary | ICD-10-CM | POA: Diagnosis not present

## 2023-10-13 DIAGNOSIS — K219 Gastro-esophageal reflux disease without esophagitis: Secondary | ICD-10-CM | POA: Diagnosis not present

## 2023-10-13 DIAGNOSIS — E042 Nontoxic multinodular goiter: Secondary | ICD-10-CM | POA: Diagnosis not present

## 2023-10-13 DIAGNOSIS — D849 Immunodeficiency, unspecified: Secondary | ICD-10-CM | POA: Diagnosis not present

## 2023-10-13 DIAGNOSIS — H409 Unspecified glaucoma: Secondary | ICD-10-CM | POA: Diagnosis not present

## 2023-10-13 DIAGNOSIS — C7B8 Other secondary neuroendocrine tumors: Secondary | ICD-10-CM | POA: Diagnosis not present

## 2023-10-13 DIAGNOSIS — E559 Vitamin D deficiency, unspecified: Secondary | ICD-10-CM | POA: Diagnosis not present

## 2023-10-13 DIAGNOSIS — I6529 Occlusion and stenosis of unspecified carotid artery: Secondary | ICD-10-CM | POA: Diagnosis not present

## 2023-10-13 DIAGNOSIS — E7800 Pure hypercholesterolemia, unspecified: Secondary | ICD-10-CM | POA: Diagnosis not present

## 2023-10-14 ENCOUNTER — Telehealth: Payer: Self-pay

## 2023-10-14 DIAGNOSIS — M170 Bilateral primary osteoarthritis of knee: Secondary | ICD-10-CM

## 2023-10-14 NOTE — Telephone Encounter (Signed)
 Ok for [employer]

## 2023-10-14 NOTE — Telephone Encounter (Signed)
 Talked with patients daughter and she stated patient is doing okay and will CB when patient is ready to proceed with gel injection.  See referrals tab

## 2023-10-15 DIAGNOSIS — D849 Immunodeficiency, unspecified: Secondary | ICD-10-CM | POA: Diagnosis not present

## 2023-10-15 DIAGNOSIS — E042 Nontoxic multinodular goiter: Secondary | ICD-10-CM | POA: Diagnosis not present

## 2023-10-15 DIAGNOSIS — H409 Unspecified glaucoma: Secondary | ICD-10-CM | POA: Diagnosis not present

## 2023-10-15 DIAGNOSIS — C7B8 Other secondary neuroendocrine tumors: Secondary | ICD-10-CM | POA: Diagnosis not present

## 2023-10-15 DIAGNOSIS — G319 Degenerative disease of nervous system, unspecified: Secondary | ICD-10-CM | POA: Diagnosis not present

## 2023-10-15 DIAGNOSIS — E559 Vitamin D deficiency, unspecified: Secondary | ICD-10-CM | POA: Diagnosis not present

## 2023-10-15 DIAGNOSIS — E7800 Pure hypercholesterolemia, unspecified: Secondary | ICD-10-CM | POA: Diagnosis not present

## 2023-10-15 DIAGNOSIS — K219 Gastro-esophageal reflux disease without esophagitis: Secondary | ICD-10-CM | POA: Diagnosis not present

## 2023-10-15 DIAGNOSIS — I6529 Occlusion and stenosis of unspecified carotid artery: Secondary | ICD-10-CM | POA: Diagnosis not present

## 2023-10-15 NOTE — Telephone Encounter (Signed)
 Called Hannah Madden back at [place] and left detailed voice message on secure line informing her of approval from PCP for orders listed.

## 2023-10-20 ENCOUNTER — Telehealth: Payer: Self-pay

## 2023-10-20 DIAGNOSIS — C7B8 Other secondary neuroendocrine tumors: Secondary | ICD-10-CM | POA: Diagnosis not present

## 2023-10-20 DIAGNOSIS — I6529 Occlusion and stenosis of unspecified carotid artery: Secondary | ICD-10-CM | POA: Diagnosis not present

## 2023-10-20 DIAGNOSIS — G319 Degenerative disease of nervous system, unspecified: Secondary | ICD-10-CM | POA: Diagnosis not present

## 2023-10-20 DIAGNOSIS — D849 Immunodeficiency, unspecified: Secondary | ICD-10-CM | POA: Diagnosis not present

## 2023-10-20 DIAGNOSIS — E7800 Pure hypercholesterolemia, unspecified: Secondary | ICD-10-CM | POA: Diagnosis not present

## 2023-10-20 DIAGNOSIS — K219 Gastro-esophageal reflux disease without esophagitis: Secondary | ICD-10-CM | POA: Diagnosis not present

## 2023-10-20 DIAGNOSIS — E042 Nontoxic multinodular goiter: Secondary | ICD-10-CM | POA: Diagnosis not present

## 2023-10-20 DIAGNOSIS — H409 Unspecified glaucoma: Secondary | ICD-10-CM | POA: Diagnosis not present

## 2023-10-20 DIAGNOSIS — E559 Vitamin D deficiency, unspecified: Secondary | ICD-10-CM | POA: Diagnosis not present

## 2023-10-20 NOTE — Telephone Encounter (Signed)
 Copied from CRM [id number]. Topic: Clinical - Medical Advice >> Oct 20, 2023 10:15 AM Chasity T wrote: Reason for CRM: Christine from [facility] is calling in to report high bp for patient if 160/95. Please call back at [phone]

## 2023-10-21 NOTE — Telephone Encounter (Signed)
 Needs ROV as pt only left the hospital 10 days ago

## 2023-10-22 DIAGNOSIS — I6529 Occlusion and stenosis of unspecified carotid artery: Secondary | ICD-10-CM | POA: Diagnosis not present

## 2023-10-22 DIAGNOSIS — E559 Vitamin D deficiency, unspecified: Secondary | ICD-10-CM | POA: Diagnosis not present

## 2023-10-22 DIAGNOSIS — H409 Unspecified glaucoma: Secondary | ICD-10-CM | POA: Diagnosis not present

## 2023-10-22 DIAGNOSIS — C7B8 Other secondary neuroendocrine tumors: Secondary | ICD-10-CM | POA: Diagnosis not present

## 2023-10-22 DIAGNOSIS — E042 Nontoxic multinodular goiter: Secondary | ICD-10-CM | POA: Diagnosis not present

## 2023-10-22 DIAGNOSIS — D849 Immunodeficiency, unspecified: Secondary | ICD-10-CM | POA: Diagnosis not present

## 2023-10-22 DIAGNOSIS — G319 Degenerative disease of nervous system, unspecified: Secondary | ICD-10-CM | POA: Diagnosis not present

## 2023-10-22 DIAGNOSIS — E7800 Pure hypercholesterolemia, unspecified: Secondary | ICD-10-CM | POA: Diagnosis not present

## 2023-10-22 DIAGNOSIS — K219 Gastro-esophageal reflux disease without esophagitis: Secondary | ICD-10-CM | POA: Diagnosis not present

## 2023-10-23 ENCOUNTER — Ambulatory Visit: Payer: Self-pay | Admitting: Nurse Practitioner

## 2023-10-23 ENCOUNTER — Telehealth: Payer: Self-pay | Admitting: Oncology

## 2023-10-23 NOTE — Telephone Encounter (Signed)
 Patient has been scheduled for follow-up visit per 10/23/23 LOS.  LVM notifying pt of appt details, provided my direct number to pt if appt changes need to be made.

## 2023-10-27 DIAGNOSIS — E7800 Pure hypercholesterolemia, unspecified: Secondary | ICD-10-CM | POA: Diagnosis not present

## 2023-10-27 DIAGNOSIS — E559 Vitamin D deficiency, unspecified: Secondary | ICD-10-CM | POA: Diagnosis not present

## 2023-10-27 DIAGNOSIS — K219 Gastro-esophageal reflux disease without esophagitis: Secondary | ICD-10-CM | POA: Diagnosis not present

## 2023-10-27 DIAGNOSIS — E042 Nontoxic multinodular goiter: Secondary | ICD-10-CM | POA: Diagnosis not present

## 2023-10-27 DIAGNOSIS — C7B8 Other secondary neuroendocrine tumors: Secondary | ICD-10-CM | POA: Diagnosis not present

## 2023-10-27 DIAGNOSIS — D849 Immunodeficiency, unspecified: Secondary | ICD-10-CM | POA: Diagnosis not present

## 2023-10-27 DIAGNOSIS — G319 Degenerative disease of nervous system, unspecified: Secondary | ICD-10-CM | POA: Diagnosis not present

## 2023-10-27 DIAGNOSIS — I6529 Occlusion and stenosis of unspecified carotid artery: Secondary | ICD-10-CM | POA: Diagnosis not present

## 2023-10-27 DIAGNOSIS — H409 Unspecified glaucoma: Secondary | ICD-10-CM | POA: Diagnosis not present

## 2023-11-03 ENCOUNTER — Inpatient Hospital Stay: Attending: Oncology

## 2023-11-03 ENCOUNTER — Encounter: Payer: Self-pay | Admitting: Oncology

## 2023-11-03 ENCOUNTER — Inpatient Hospital Stay (HOSPITAL_BASED_OUTPATIENT_CLINIC_OR_DEPARTMENT_OTHER): Admitting: Oncology

## 2023-11-03 ENCOUNTER — Inpatient Hospital Stay

## 2023-11-03 VITALS — BP 154/72 | HR 69 | Temp 97.8°F | Resp 18 | Ht 65.0 in | Wt 116.5 lb

## 2023-11-03 DIAGNOSIS — Z79899 Other long term (current) drug therapy: Secondary | ICD-10-CM | POA: Diagnosis not present

## 2023-11-03 DIAGNOSIS — N2889 Other specified disorders of kidney and ureter: Secondary | ICD-10-CM | POA: Diagnosis not present

## 2023-11-03 DIAGNOSIS — C7B8 Other secondary neuroendocrine tumors: Secondary | ICD-10-CM

## 2023-11-03 DIAGNOSIS — K7689 Other specified diseases of liver: Secondary | ICD-10-CM | POA: Insufficient documentation

## 2023-11-03 DIAGNOSIS — H409 Unspecified glaucoma: Secondary | ICD-10-CM | POA: Insufficient documentation

## 2023-11-03 DIAGNOSIS — E785 Hyperlipidemia, unspecified: Secondary | ICD-10-CM | POA: Diagnosis not present

## 2023-11-03 LAB — CBC WITH DIFFERENTIAL (CANCER CENTER ONLY)
Abs Immature Granulocytes: 0.01 K/uL (ref 0.00–0.07)
Basophils Absolute: 0 K/uL (ref 0.0–0.1)
Basophils Relative: 1 %
Eosinophils Absolute: 0.8 K/uL — ABNORMAL HIGH (ref 0.0–0.5)
Eosinophils Relative: 12 %
HCT: 33.4 % — ABNORMAL LOW (ref 36.0–46.0)
Hemoglobin: 10.9 g/dL — ABNORMAL LOW (ref 12.0–15.0)
Immature Granulocytes: 0 %
Lymphocytes Relative: 33 %
Lymphs Abs: 2.3 K/uL (ref 0.7–4.0)
MCH: 29.4 pg (ref 26.0–34.0)
MCHC: 32.6 g/dL (ref 30.0–36.0)
MCV: 90 fL (ref 80.0–100.0)
Monocytes Absolute: 0.5 K/uL (ref 0.1–1.0)
Monocytes Relative: 7 %
Neutro Abs: 3.2 K/uL (ref 1.7–7.7)
Neutrophils Relative %: 47 %
Platelet Count: 252 K/uL (ref 150–400)
RBC: 3.71 MIL/uL — ABNORMAL LOW (ref 3.87–5.11)
RDW: 13.7 % (ref 11.5–15.5)
WBC Count: 6.8 K/uL (ref 4.0–10.5)
nRBC: 0 % (ref 0.0–0.2)

## 2023-11-03 LAB — CMP (CANCER CENTER ONLY)
ALT: 11 U/L (ref 0–44)
AST: 23 U/L (ref 15–41)
Albumin: 4.8 g/dL (ref 3.5–5.0)
Alkaline Phosphatase: 100 U/L (ref 38–126)
Anion gap: 13 (ref 5–15)
BUN: 13 mg/dL (ref 8–23)
CO2: 23 mmol/L (ref 22–32)
Calcium: 10 mg/dL (ref 8.9–10.3)
Chloride: 103 mmol/L (ref 98–111)
Creatinine: 0.88 mg/dL (ref 0.44–1.00)
GFR, Estimated: >60 mL/min (ref >60)
Glucose, Bld: 97 mg/dL (ref 70–99)
Potassium: 4.2 mmol/L (ref 3.5–5.1)
Sodium: 139 mmol/L (ref 135–145)
Total Bilirubin: 0.4 mg/dL (ref 0.0–1.2)
Total Protein: 7.7 g/dL (ref 6.5–8.1)

## 2023-11-03 LAB — MAGNESIUM: Magnesium: 1.9 mg/dL (ref 1.7–2.4)

## 2023-11-03 MED ORDER — OCTREOTIDE ACETATE 20 MG IM KIT
20.0000 mg | PACK | Freq: Once | INTRAMUSCULAR | Status: AC
  Administered 2023-11-03: 20 mg via INTRAMUSCULAR
  Filled 2023-11-03: qty 1

## 2023-11-03 NOTE — Patient Instructions (Signed)
 Octreotide Injection Solution What is this medication? OCTREOTIDE (ok TREE oh tide) treats high levels of growth hormone (acromegaly). It works by reducing the amount of growth hormone your body makes. This reduces symptoms and the risk of health problems caused by too much growth hormone, such as diabetes and heart disease. It may also be used to treat diarrhea caused by neuroendocrine tumors. It works by slowing down the release of serotonin from the tumor cells. This reduces the number of bowel movements you have. This medicine may be used for other purposes; ask your health care provider or pharmacist if you have questions. COMMON BRAND NAME(S): Berline Lopes, Sandostatin What should I tell my care team before I take this medication? They need to know if you have any of these conditions: Diabetes Gallbladder disease Heart disease Kidney disease Liver disease Pancreatic disease Thyroid disease An unusual or allergic reaction to octreotide, other medications, foods, dyes, or preservatives Pregnant or trying to get pregnant Breastfeeding How should I use this medication? This medication is injected under the skin or into a vein. It is usually given by your care team in a hospital or clinic setting. If you get this medication at home, you will be taught how to prepare and give it. Use exactly as directed. Take it as directed on the prescription label at the same time every day. Keep taking it unless your care team tells you to stop. Allow the injection solution to come to room temperature before use. Do not warm it artificially. It is important that you put your used needles and syringes in a special sharps container. Do not put them in a trash can. If you do not have a sharps container, call your pharmacist or care team to get one. Talk to your care team about the use of this medication in children. Special care may be needed. Overdosage: If you think you have taken too much of this medicine  contact a poison control center or emergency room at once. NOTE: This medicine is only for you. Do not share this medicine with others. What if I miss a dose? If you miss a dose, take it as soon as you can. If it is almost time for your next dose, take only that dose. Do not take double or extra doses. What may interact with this medication? Bromocriptine Certain medications for blood pressure, heart disease, irregular heartbeat Cyclosporine Diuretics Medications for diabetes, including insulin Quinidine This list may not describe all possible interactions. Give your health care provider a list of all the medicines, herbs, non-prescription drugs, or dietary supplements you use. Also tell them if you smoke, drink alcohol, or use illegal drugs. Some items may interact with your medicine. What should I watch for while using this medication? Visit your care team for regular checks on your progress. Tell your care team if your symptoms do not start to get better or if they get worse. To help reduce irritation at the injection site, use a different site for each injection and make sure the solution is at room temperature before use. This medication may cause decreases in blood sugar. Signs of low blood sugar include chills, cool, pale skin or cold sweats, drowsiness, extreme hunger, fast heartbeat, headache, nausea, nervousness or anxiety, shakiness, trembling, unsteadiness, tiredness, or weakness. Contact your care team right away if you experience any of these symptoms. This medication may increase blood sugar. The risk may be higher in patients who already have diabetes. Ask your care team what you can  do to lower your risk of diabetes while taking this medication. You should make sure you get enough vitamin B12 while you are taking this medication. Discuss the foods you eat and the vitamins you take with your care team. What side effects may I notice from receiving this medication? Side effects that  you should report to your care team as soon as possible: Allergic reactions--skin rash, itching, hives, swelling of the face, lips, tongue, or throat Gallbladder problems--severe stomach pain, nausea, vomiting, fever Heart rhythm changes--fast or irregular heartbeat, dizziness, feeling faint or lightheaded, chest pain, trouble breathing High blood sugar (hyperglycemia)--increased thirst or amount of urine, unusual weakness or fatigue, blurry vision Low blood sugar (hypoglycemia)--tremors or shaking, anxiety, sweating, cold or clammy skin, confusion, dizziness, rapid heartbeat Low thyroid levels (hypothyroidism)--unusual weakness or fatigue, increased sensitivity to cold, constipation, hair loss, dry skin, weight gain, feelings of depression Low vitamin B12 level--pain, tingling, or numbness in the hands or feet, muscle weakness, dizziness, confusion, trouble concentrating Oily or light-colored stools, diarrhea, bloating, weight loss Pancreatitis--severe stomach pain that spreads to your back or gets worse after eating or when touched, fever, nausea, vomiting Slow heartbeat--dizziness, feeling faint or lightheaded, confusion, trouble breathing, unusual weakness or fatigue Side effects that usually do not require medical attention (report these to your care team if they continue or are bothersome): Diarrhea Dizziness Headache Nausea Pain, redness, or irritation at injection site Stomach pain This list may not describe all possible side effects. Call your doctor for medical advice about side effects. You may report side effects to FDA at 1-800-FDA-1088. Where should I keep my medication? Keep out of the reach of children and pets. Store in the refrigerator. Protect from light. Allow to come to room temperature naturally. Do not use artificial heat. If protected from light, the injection may be stored between 20 and 30 degrees C (70 and 86 degrees F) for 14 days. After the initial use, throw away  any unused portion of a multiple dose vial after 14 days. Get rid of any unused portions of the ampules after use. To get rid of medications that are no longer needed or have expired: Take the medication to a medication take-back program. Ask your pharmacy or law enforcement to find a location. If you cannot return the medication, ask your pharmacist or care team how to get rid of the medication safely. NOTE: This sheet is a summary. It may not cover all possible information. If you have questions about this medicine, talk to your doctor, pharmacist, or health care provider.  2024 Elsevier/Gold Standard (2023-01-30 00:00:00)

## 2023-11-03 NOTE — Assessment & Plan Note (Signed)
 Tumor located at the upper pole of the right kidney, measuring approximately 3.9 cm. Imaging suggests renal carcinoma, but further evaluation is required. Urology will assess the possibility of surgical resection versus ablation.  She does have an appointment coming up with their office next week.    The tumor is distinct from the neuroendocrine tumor and is suspected to be primary renal cancer. Surgical resection is preferred if feasible to obtain tissue for definitive diagnosis and to assess the grade of the tumor.   She had urology evaluation for the kidney mass.  She was not deemed to be a surgical candidate.  Cystoscopy showed no bladder tumor.  Surveillance versus IR evaluation for possible ablation was recommended.  Referral sent to IR Dr. Alona.  Intervention currently on hold

## 2023-11-03 NOTE — Assessment & Plan Note (Addendum)
 Please review oncology history for additional details and timeline of events.    CT abdomen and pelvis on 06/20/2023 showed multiple large heterogeneous liver lesions, largest measuring 10.6 cm in the right hepatic lobe and 5.7 cm in the left hepatic lobe.  Also noted was heterogeneous and has a mass in the superior pole of the right kidney measuring 3.5 cm, concerning for renal cell carcinoma.  Mesenteric mass or lymphadenopathy in the left central mesentery measuring 3.9 cm.  CT chest showed no evidence of metastatic disease in the chest.  On 06/22/2023, she underwent ultrasound-guided biopsy of the liver lesion.  Pathology showed well-differentiated neuroendocrine tumor, grade 1, Ki-67 index of 2%.  The neoplastic cells were diffusely and strongly positive for the neuroendocrine markers chromogranin and synaptic ficin.  They were negative for cytokeratin 7 and cytokeratin 20.  The tumor exhibits focal patchy nonspecific positivity for PAX 8 which is of uncertain significance.    Previously I discussed diagnosis, prognosis, plan of care, treatment options.  Reviewed NCCN guidelines.  Stage IV disease because of extensive liver metastatic disease.  The tumor is well-differentiated, low-grade, with a Ki-67 of 2%, indicating slow growth.  She does not have any carcinoid symptoms.  Despite its indolent nature, the significant hepatic tumor burden necessitates treatment to prevent liver function compromise. The primary site is suspected to be the gut lining but is not definitively identified. The tumor is not expected to significantly affect life expectancy. Treatment aims to control disease progression and prevent further spread, as a complete cure is not feasible due to extensive liver involvement.  On her initial consultation with us  on 06/30/2023, chromogranin A was elevated at 1495 ng/mL.  Request submitted for PET dotatate scan.    On 07/09/2023, PET dotatate scan showed bilobar tracer avid liver  metastases, large lesion measuring 10.7 x 8.5 cm in the right lobe, 6.4 x 3.8 cm in the left lobe.  Multiple tracer avid lymph nodes identified within the small bowel mesentery, compatible with nodal metastasis.  Focal area of increased radiotracer uptake localized to the loop of small bowel within the left lower quadrant, likely small bowel primary.  No uptake noted in the right kidney lesion.   Patient did not have carcinoid symptoms.  However given the burden of disease, decision was made to proceed with octreotide  injections, which she started from 07/15/2023.  She tolerated the injections well.  Dose given today.  Plan to continue monthly.   She had urology evaluation for the kidney mass.  She was not deemed to be a surgical candidate.  Cystoscopy showed no bladder tumor.  Surveillance versus IR evaluation for possible ablation was recommended.  Referral sent to IR Dr. Alona and I also discussed her case with him previously over the phone.  Intervention is currently on hold.  Recently she was hospitalized with some dizziness/presyncopal episodes and blood pressure fluctuations.  These could be a manifestation of metastatic neuroendocrine tumor related carcinoid symptoms.  Labs today show normal chemistries and improved hemoglobin levels. The systolic blood pressure is acceptable between 140-150 mmHg, as lower pressures could cause dizziness.  Will obtain PET dotatate scan for restaging, prior to next visit.  Depending on results, will consider increasing octreotide  injections to 30 mg monthly dosing.  Proceed with octreotide  20 mg dose today.  RTC in 4 weeks for labs, office visit and octreotide  injection.

## 2023-11-03 NOTE — Progress Notes (Signed)
 [facility]  ONCOLOGY CLINIC PROGRESS NOTE   Patient Care Team: [name], MD as PCP - General (Internal Medicine) Jakie Alm SAUNDERS, MD as Consulting Physician (Gastroenterology) [place], Heather, OD (Optometry) Livingston Rigg, MD as Consulting Physician (Dermatology)  PATIENT NAME: Hannah Madden   MR#: [id number] DOB: May 08, 1934  Date of visit: 11/03/2023   ASSESSMENT & PLAN:   Hannah Madden is a 89 y.o.  lady with a past medical history of GERD, glaucoma, dyslipidemia, hiatal hernia, alopecia, was referred to our clinic for newly diagnosed neuroendocrine tumor with multiple liver mets.  Also has renal mass, concerning for separate renal primary.   Metastatic malignant neuroendocrine tumor to liver [facility]) Please review oncology history for additional details and timeline of events.    CT abdomen and pelvis on 06/20/2023 showed multiple large heterogeneous liver lesions, largest measuring 10.6 cm in the right hepatic lobe and 5.7 cm in the left hepatic lobe.  Also noted was heterogeneous and has a mass in the superior pole of the right kidney measuring 3.5 cm, concerning for renal cell carcinoma.  Mesenteric mass or lymphadenopathy in the left central mesentery measuring 3.9 cm.  CT chest showed no evidence of metastatic disease in the chest.  On 06/22/2023, she underwent ultrasound-guided biopsy of the liver lesion.  Pathology showed well-differentiated neuroendocrine tumor, grade 1, Ki-67 index of 2%.  The neoplastic cells were diffusely and strongly positive for the neuroendocrine markers chromogranin and synaptic ficin.  They were negative for cytokeratin 7 and cytokeratin 20.  The tumor exhibits focal patchy nonspecific positivity for PAX 8 which is of uncertain significance.    Previously I discussed diagnosis, prognosis, plan of care, treatment options.  Reviewed NCCN guidelines.  Stage IV disease because of extensive liver metastatic disease.  The tumor is  well-differentiated, low-grade, with a Ki-67 of 2%, indicating slow growth.  She does not have any carcinoid symptoms.  Despite its indolent nature, the significant hepatic tumor burden necessitates treatment to prevent liver function compromise. The primary site is suspected to be the gut lining but is not definitively identified. The tumor is not expected to significantly affect life expectancy. Treatment aims to control disease progression and prevent further spread, as a complete cure is not feasible due to extensive liver involvement.  On her initial consultation with us  on 06/30/2023, chromogranin A was elevated at 1495 ng/mL.  Request submitted for PET dotatate scan.    On 07/09/2023, PET dotatate scan showed bilobar tracer avid liver metastases, large lesion measuring 10.7 x 8.5 cm in the right lobe, 6.4 x 3.8 cm in the left lobe.  Multiple tracer avid lymph nodes identified within the small bowel mesentery, compatible with nodal metastasis.  Focal area of increased radiotracer uptake localized to the loop of small bowel within the left lower quadrant, likely small bowel primary.  No uptake noted in the right kidney lesion.   Patient did not have carcinoid symptoms.  However given the burden of disease, decision was made to proceed with octreotide  injections, which she started from 07/15/2023.  She tolerated the injections well.  Dose given today.  Plan to continue monthly.   She had urology evaluation for the kidney mass.  She was not deemed to be a surgical candidate.  Cystoscopy showed no bladder tumor.  Surveillance versus IR evaluation for possible ablation was recommended.  Referral sent to IR Dr. Alona and I also discussed her case with him previously over the phone.  Intervention is  currently on hold.  Recently she was hospitalized with some dizziness/presyncopal episodes and blood pressure fluctuations.  These could be a manifestation of metastatic neuroendocrine tumor related carcinoid  symptoms.  Labs today show normal chemistries and improved hemoglobin levels. The systolic blood pressure is acceptable between 140-150 mmHg, as lower pressures could cause dizziness.  Will obtain PET dotatate scan for restaging, prior to next visit.  Depending on results, will consider increasing octreotide  injections to 30 mg monthly dosing.  Proceed with octreotide  20 mg dose today.  RTC in 4 weeks for labs, office visit and octreotide  injection.  Right kidney mass Tumor located at the upper pole of the right kidney, measuring approximately 3.9 cm. Imaging suggests renal carcinoma, but further evaluation is required. Urology will assess the possibility of surgical resection versus ablation.  She does have an appointment coming up with their office next week.    The tumor is distinct from the neuroendocrine tumor and is suspected to be primary renal cancer. Surgical resection is preferred if feasible to obtain tissue for definitive diagnosis and to assess the grade of the tumor.   She had urology evaluation for the kidney mass.  She was not deemed to be a surgical candidate.  Cystoscopy showed no bladder tumor.  Surveillance versus IR evaluation for possible ablation was recommended.  Referral sent to IR Dr. Alona.  Intervention currently on hold   I reviewed lab results and outside records for this visit and discussed relevant results with the patient. Diagnosis, plan of care and treatment options were also discussed in detail with the patient. Opportunity provided to ask questions and answers provided to her apparent satisfaction. Provided instructions to call our clinic with any problems, questions or concerns prior to return visit. I recommended to continue follow-up with PCP and sub-specialists. She verbalized understanding and agreed with the plan.   NCCN guidelines have been consulted in the planning of this patient's care.  I spent a total of 30 minutes during this encounter with  the patient including review of chart and various tests results, discussions about plan of care and coordination of care plan.   Chinita Patten, MD  11/03/2023 3:40 PM  [facility] - A DEPT OF [facility] [address] [place] [address] Dept: [phone] Dept Fax: [phone]    CHIEF COMPLAINT/ REASON FOR VISIT:   Well-differentiated neuroendocrine tumor, grade 1, with extensive liver metastatic disease, presumed to be small bowel primary.  Right kidney mass, likely separate primary.  Current Treatment: Monthly octreotide  injections started from 07/15/2023.  INTERVAL HISTORY:   Discussed the use of AI scribe software for clinical note transcription with the patient, who gave verbal consent to proceed.  History of Present Illness  Hannah Madden is an 89 year old female with a neuroendocrine tumor who presents with dizziness and fainting spells. She is accompanied by her daughter, who is her primary caregiver.  She has been experiencing episodes of dizziness and fainting spells. Last month, after receiving an injection, she was hospitalized for a couple of days due to dizziness and lightheadedness. During one episode, she was aware she was about to faint and managed to break her fall. This was the first time she was aware of her dizzy spells; previously, she was not aware before fainting.  Her blood pressure has been fluctuating, with recent trends showing elevated levels. During her hospital stay, she underwent a comprehensive examination, including an MRI of the  brain and a CT scan of the chest.  She is currently receiving monthly injections to manage her condition, with the last dose being 20 mg. She reports having a good appetite and has gained weight recently.  There are days when she feels very weak and lethargic, while on other days, she is full of energy. She is able to drive, but the variability  in her energy levels and the occurrence of fainting spells are concerning.  She takes Claritin  for seasonal allergies.   I have reviewed the past medical history, past surgical history, social history and family history with the patient and they are unchanged from previous note.  HISTORY OF PRESENT ILLNESS:   ONCOLOGY HISTORY:   Patient presented to the ED with complaints of worsening abdominal pain, back pain and near syncope on 06/19/2023 and was hospitalized for further evaluation.   CT abdomen and pelvis on 06/20/2023 showed multiple large heterogeneous liver lesions, largest measuring 10.6 cm in the right hepatic lobe and 5.7 cm in the left hepatic lobe.  Also noted was heterogeneous and has a mass in the superior pole of the right kidney measuring 3.5 cm, concerning for renal cell carcinoma.  Mesenteric mass or lymphadenopathy in the left central mesentery measuring 3.9 cm.  CT chest showed no evidence of metastatic disease in the chest.   Urology and interventional radiology was consulted.  Though she may have renal cell carcinoma, liver metastatic disease was not felt to be related as it is not common for renal cell carcinoma to metastasize to liver.  She may have a lesion in the bladder based on the CT imaging and hence outpatient cystoscopy and further workup was planned by urology.   On 06/22/2023, she underwent ultrasound-guided biopsy of the liver lesion.  Pathology showed well-differentiated neuroendocrine tumor, grade 1, Ki-67 index of 2%.  The neoplastic cells were diffusely and strongly positive for the neuroendocrine markers chromogranin and synaptic ficin.  They were negative for cytokeratin 7 and cytokeratin 20.  The tumor exhibits focal patchy nonspecific positivity for PAX 8 which is of uncertain significance.     On her initial consultation with us  on 06/30/2023, chromogranin A was elevated at 1495 ng/mL.  Request submitted for PET dotatate scan.    On 07/09/2023, PET dotatate  scan showed bilobar tracer avid liver metastases, large lesion measuring 10.7 x 8.5 cm in the right lobe, 6.4 x 3.8 cm in the left lobe.  Multiple tracer avid lymph nodes identified within the small bowel mesentery, compatible with nodal metastasis.  Focal area of increased radiotracer uptake localized to the loop of small bowel within the left lower quadrant, likely small bowel primary.  No uptake noted in the right kidney lesion.   Patient does not have carcinoid symptoms.  However given the burden of disease, decision was made to proceed with octreotide  injections, which she started from 07/15/2023.  Plan to continue monthly.   She had urology evaluation for the kidney mass.  She was not deemed to be a surgical candidate.  Cystoscopy showed no bladder tumor.  Surveillance versus IR evaluation for possible ablation was recommended.  Referral sent to IR Dr. Alona.   REVIEW OF SYSTEMS:   Review of Systems - Oncology  All other pertinent systems were reviewed with the patient and are negative.  ALLERGIES: She is allergic to atorvastatin, macrobid [nitrofurantoin], penicillins, and ciprofloxacin .  MEDICATIONS:  Current Outpatient Medications  Medication Sig Dispense Refill   acetaminophen  (TYLENOL ) 500 MG tablet Take 500 mg  by mouth every 6 (six) hours as needed for moderate pain (pain score 4-6).     brimonidine -timolol  (COMBIGAN ) 0.2-0.5 % ophthalmic solution Place 1 drop into the right eye 2 (two) times daily.     brinzolamide  (AZOPT ) 1 % ophthalmic suspension Place 1 drop into the right eye 2 (two) times daily.     cyanocobalamin  (VITAMIN B12) 1000 MCG tablet Take 1 tablet (1,000 mcg total) by mouth daily. 30 tablet 0   ondansetron  (ZOFRAN ) 4 MG tablet Take 1 tablet (4 mg total) by mouth every 8 (eight) hours as needed for nausea or vomiting. 90 tablet 3   pantoprazole  (PROTONIX ) 40 MG tablet Take 1 tablet (40 mg total) by mouth daily. (Patient taking differently: Take 40 mg by mouth daily.  Pt stated she takes this PRN) 90 tablet 3   tobramycin  (TOBREX ) 0.3 % ophthalmic solution Place 1 drop into the right eye as needed (before eye injection).     No current facility-administered medications for this visit.     VITALS:   Blood pressure (!) 154/72, pulse 69, temperature 97.8 F (36.6 C), temperature source Oral, resp. rate 18, height 5' 5 (1.651 m), weight 116 lb 8 oz (52.8 kg), SpO2 100%.  Wt Readings from Last 3 Encounters:  11/03/23 116 lb 8 oz (52.8 kg)  10/09/23 114 lb 10.2 oz (52 kg)  10/08/23 114 lb 6.4 oz (51.9 kg)    Body mass index is 19.39 kg/m.     Onc Performance Status - 11/03/23 0936       ECOG Perf Status   ECOG Perf Status Ambulatory and capable of all selfcare but unable to carry out any work activities.  Up and about more than 50% of waking hours      KPS SCALE   KPS % SCORE Normal activity with effort, some s/s of disease           PHYSICAL EXAM:   Physical Exam Constitutional:      General: She is not in acute distress.    Appearance: Normal appearance.  HENT:     Head: Normocephalic and atraumatic.  Cardiovascular:     Rate and Rhythm: Normal rate.  Pulmonary:     Effort: Pulmonary effort is normal. No respiratory distress.  Abdominal:     General: There is no distension.  Neurological:     General: No focal deficit present.     Mental Status: She is alert and oriented to person, place, and time.  Psychiatric:        Mood and Affect: Mood normal.        Behavior: Behavior normal.      LABORATORY DATA:   I have reviewed the data as listed.  Results for orders placed or performed in visit on 11/03/23  CMP (Cancer Center only)  Result Value Ref Range   Sodium 139 135 - 145 mmol/L   Potassium 4.2 3.5 - 5.1 mmol/L   Chloride 103 98 - 111 mmol/L   CO2 23 22 - 32 mmol/L   Glucose, Bld 97 70 - 99 mg/dL   BUN 13 8 - 23 mg/dL   Creatinine 9.11 9.55 - 1.00 mg/dL   Calcium  10.0 8.9 - 10.3 mg/dL   Total Protein 7.7 6.5 -  8.1 g/dL   Albumin 4.8 3.5 - 5.0 g/dL   AST 23 15 - 41 U/L   ALT 11 0 - 44 U/L   Alkaline Phosphatase 100 38 - 126 U/L   Total Bilirubin 0.4  0.0 - 1.2 mg/dL   GFR, Estimated >39 >39 mL/min   Anion gap 13 5 - 15  CBC with Differential (Cancer Center Only)  Result Value Ref Range   WBC Count 6.8 4.0 - 10.5 K/uL   RBC 3.71 (L) 3.87 - 5.11 MIL/uL   Hemoglobin 10.9 (L) 12.0 - 15.0 g/dL   HCT 66.5 (L) 63.9 - 53.9 %   MCV 90.0 80.0 - 100.0 fL   MCH 29.4 26.0 - 34.0 pg   MCHC 32.6 30.0 - 36.0 g/dL   RDW 86.2 88.4 - 84.4 %   Platelet Count 252 150 - 400 K/uL   nRBC 0.0 0.0 - 0.2 %   Neutrophils Relative % 47 %   Neutro Abs 3.2 1.7 - 7.7 K/uL   Lymphocytes Relative 33 %   Lymphs Abs 2.3 0.7 - 4.0 K/uL   Monocytes Relative 7 %   Monocytes Absolute 0.5 0.1 - 1.0 K/uL   Eosinophils Relative 12 %   Eosinophils Absolute 0.8 (H) 0.0 - 0.5 K/uL   Basophils Relative 1 %   Basophils Absolute 0.0 0.0 - 0.1 K/uL   Immature Granulocytes 0 %   Abs Immature Granulocytes 0.01 0.00 - 0.07 K/uL  Magnesium   Result Value Ref Range   Magnesium  1.9 1.7 - 2.4 mg/dL       RADIOGRAPHIC STUDIES:  No recent pertinent imaging available.    CODE STATUS:  Code Status History     Date Active Date Inactive Code Status Order ID Comments User Context   06/20/2023 0855 06/23/2023 1921 Full Code [id number]  Celinda Alm Lot, MD Inpatient   06/20/2023 0855 06/20/2023 0855 Full Code [id number]  Celinda Alm Lot, MD Inpatient   09/12/2013 1422 09/13/2013 1539 Full Code [id number]  Addie Cordella Hamilton, MD Inpatient    Questions for Most Recent Historical Code Status (Order [id number])     Question Answer   By: Consent: discussion documented in EHR            Orders Placed This Encounter  Procedures   NM PET DOTATATE SKULL BASE TO MID THIGH    Standing Status:   Future    Expected Date:   11/17/2023    Expiration Date:   11/02/2024    If indicated for the ordered procedure, I authorize the  administration of a radiopharmaceutical per Radiology protocol:   Yes    Preferred imaging location?:   [facility]   CBC with Differential (Cancer Center Only)    Standing Status:   Standing    Number of Occurrences:   6    Expiration Date:   11/02/2024   CMP (Cancer Center only)    Standing Status:   Standing    Number of Occurrences:   6    Expiration Date:   11/02/2024   Magnesium     Standing Status:   Standing    Number of Occurrences:   6    Expiration Date:   11/02/2024    Future Appointments  Date Time Provider Department Center  11/18/2023  3:15 PM Addie Cordella Hamilton, MD [facility] None    This document was completed utilizing speech recognition software. Grammatical errors, random word insertions, pronoun errors, and incomplete sentences are an occasional consequence of this system due to software limitations, ambient noise, and hardware issues. Any formal questions or concerns about the content, text or information contained within the body of this dictation should be directly addressed to the provider for clarification.

## 2023-11-04 LAB — CHROMOGRANIN A: Chromogranin A (ng/mL): 1868 ng/mL — ABNORMAL HIGH (ref 0.0–101.8)

## 2023-11-05 DIAGNOSIS — D849 Immunodeficiency, unspecified: Secondary | ICD-10-CM | POA: Diagnosis not present

## 2023-11-05 DIAGNOSIS — K219 Gastro-esophageal reflux disease without esophagitis: Secondary | ICD-10-CM | POA: Diagnosis not present

## 2023-11-05 DIAGNOSIS — C7B8 Other secondary neuroendocrine tumors: Secondary | ICD-10-CM | POA: Diagnosis not present

## 2023-11-05 DIAGNOSIS — G319 Degenerative disease of nervous system, unspecified: Secondary | ICD-10-CM | POA: Diagnosis not present

## 2023-11-05 DIAGNOSIS — H409 Unspecified glaucoma: Secondary | ICD-10-CM | POA: Diagnosis not present

## 2023-11-05 DIAGNOSIS — E042 Nontoxic multinodular goiter: Secondary | ICD-10-CM | POA: Diagnosis not present

## 2023-11-05 DIAGNOSIS — I6529 Occlusion and stenosis of unspecified carotid artery: Secondary | ICD-10-CM | POA: Diagnosis not present

## 2023-11-05 DIAGNOSIS — E7800 Pure hypercholesterolemia, unspecified: Secondary | ICD-10-CM | POA: Diagnosis not present

## 2023-11-05 DIAGNOSIS — E559 Vitamin D deficiency, unspecified: Secondary | ICD-10-CM | POA: Diagnosis not present

## 2023-11-09 ENCOUNTER — Encounter: Payer: Self-pay | Admitting: Oncology

## 2023-11-10 ENCOUNTER — Encounter: Payer: Self-pay | Admitting: Internal Medicine

## 2023-11-10 ENCOUNTER — Ambulatory Visit: Admitting: Internal Medicine

## 2023-11-10 VITALS — BP 142/82 | HR 78 | Temp 98.3°F | Ht 65.0 in | Wt 118.0 lb

## 2023-11-10 DIAGNOSIS — E7800 Pure hypercholesterolemia, unspecified: Secondary | ICD-10-CM

## 2023-11-10 DIAGNOSIS — E559 Vitamin D deficiency, unspecified: Secondary | ICD-10-CM

## 2023-11-10 DIAGNOSIS — I10 Essential (primary) hypertension: Secondary | ICD-10-CM | POA: Diagnosis not present

## 2023-11-10 DIAGNOSIS — D171 Benign lipomatous neoplasm of skin and subcutaneous tissue of trunk: Secondary | ICD-10-CM

## 2023-11-10 DIAGNOSIS — D179 Benign lipomatous neoplasm, unspecified: Secondary | ICD-10-CM | POA: Insufficient documentation

## 2023-11-10 DIAGNOSIS — E538 Deficiency of other specified B group vitamins: Secondary | ICD-10-CM

## 2023-11-10 MED ORDER — TRIAMCINOLONE ACETONIDE 0.1 % EX CREA: 1.0000 | TOPICAL_CREAM | Freq: Two times a day (BID) | CUTANEOUS | 1 refills | Status: AC

## 2023-11-10 MED ORDER — LOSARTAN POTASSIUM 50 MG PO TABS: 50.0000 mg | ORAL_TABLET | Freq: Every day | ORAL | 3 refills | Status: DC

## 2023-11-10 MED ORDER — PANTOPRAZOLE SODIUM 40 MG PO TBEC: 40.0000 mg | DELAYED_RELEASE_TABLET | Freq: Every day | ORAL | 3 refills | Status: AC

## 2023-11-10 NOTE — Assessment & Plan Note (Signed)
 Lab Results  Component Value Date   VITAMINB12 94 (L) 04/17/2023   Low, to start oral replacement - b12 1000 mcg qd

## 2023-11-10 NOTE — Assessment & Plan Note (Signed)
 Last vitamin D Lab Results  Component Value Date   VD25OH 13.39 (L) 04/17/2023   Low, to start oral replacement

## 2023-11-10 NOTE — Patient Instructions (Addendum)
 You had the flu shot today  Please take all new medication as prescribed - the losartan  50 mg per day for blood pressure  Please continue all other medications as before, and refills have been done for the protonix   The eye drops would need to come by the eye doctor  Please have the pharmacy call with any other refills you may need.  Please continue your efforts at being more active, low cholesterol diet, and weight control.  Please keep your appointments with your specialists as you may have planned - oncology  We can hold on other testing today  Please make an Appointment to return in 6 months, or sooner if needed

## 2023-11-10 NOTE — Assessment & Plan Note (Signed)
 D/w pt - benign, and ok to follow

## 2023-11-10 NOTE — Assessment & Plan Note (Signed)
 BP Readings from Last 3 Encounters:  11/10/23 (!) 142/82  11/03/23 (!) 154/72  10/10/23 (!) 161/90   Uncontrolled, no willing to start losartan  50 mg every day, f/u next visit

## 2023-11-10 NOTE — Assessment & Plan Note (Signed)
 Lab Results  Component Value Date   LDLCALC 90 04/17/2023   Stable, pt to continue low chol diet

## 2023-11-10 NOTE — Progress Notes (Signed)
 Patient ID: Hannah Madden, female   DOB: 04/09/1934, 89 y.o.   MRN: [id number]        Chief Complaint: follow up htn, lipoma, low vit d and b12       HPI:  Hannah Madden is a 89 y.o. female here with sister, pt willing to start BP med for tx today though admits she has had spotty compliance with taking meds; also has a fatty soft lump to the left flank area new for her, but does not bother her at all.  Pt denies chest pain, increased sob or doe, wheezing, orthopnea, PND, increased LE swelling, palpitations, dizziness or syncope.   Pt denies polydipsia, polyuria, or new focal neuro s/s.  PET scan ordered for next wk to check on response to tx.  Wt is up several lbs.  Due for flu shot Wt Readings from Last 3 Encounters:  11/10/23 118 lb (53.5 kg)  11/03/23 116 lb 8 oz (52.8 kg)  10/09/23 114 lb 10.2 oz (52 kg)   BP Readings from Last 3 Encounters:  11/10/23 (!) 142/82  11/03/23 (!) 154/72  10/10/23 (!) 161/90         Past Medical History:  Diagnosis Date   Alopecia    stress related and resolved since her Mother moved to SNF   Carotid artery occlusion    GERD (gastroesophageal reflux disease)    takes Nexium  daily   Glaucoma    uses eye drops daily   H/O hiatal hernia    Hypercholesteremia    takes Simvastatin  daily   Pneumonia 03/03/1958   Syncope 08/20/2020   UTI (urinary tract infection) 06/20/2023   Vitamin D  deficiency 12/24/2019   Past Surgical History:  Procedure Laterality Date   ABDOMINAL HYSTERECTOMY     fibroids.   COLONOSCOPY     KNEE ARTHROSCOPY WITH MEDIAL MENISECTOMY Left 09/12/2013   Procedure: KNEE ARTHROSCOPY WITH DEBRIDEMENT;  Surgeon: Cordella Glendia Hutchinson, MD;  Location: [facility] OR;  Service: Orthopedics;  Laterality: Left;    reports that she has never smoked. She has never used smokeless tobacco. She reports that she does not drink alcohol  and does not use drugs. family history is not on file. Allergies  Allergen Reactions   Atorvastatin     REACTION:  INTOL to Lipitor w/ leg pain   Macrobid [Nitrofurantoin] Other (See Comments)    diarrhea   Penicillins     REACTION: hives   Ciprofloxacin  Rash   Current Outpatient Medications on File Prior to Visit  Medication Sig Dispense Refill   acetaminophen  (TYLENOL ) 500 MG tablet Take 500 mg by mouth every 6 (six) hours as needed for moderate pain (pain score 4-6).     brimonidine -timolol  (COMBIGAN ) 0.2-0.5 % ophthalmic solution Place 1 drop into the right eye 2 (two) times daily.     brinzolamide  (AZOPT ) 1 % ophthalmic suspension Place 1 drop into the right eye 2 (two) times daily.     cyanocobalamin  (VITAMIN B12) 1000 MCG tablet Take 1 tablet (1,000 mcg total) by mouth daily. 30 tablet 0   ondansetron  (ZOFRAN ) 4 MG tablet Take 1 tablet (4 mg total) by mouth every 8 (eight) hours as needed for nausea or vomiting. 90 tablet 3   tobramycin  (TOBREX ) 0.3 % ophthalmic solution Place 1 drop into the right eye as needed (before eye injection).     No current facility-administered medications on file prior to visit.        ROS:  All others reviewed and negative.  Objective        PE:  BP (!) 142/82   Pulse 78   Temp 98.3 F (36.8 C)   Ht 5' 5 (1.651 m)   Wt 118 lb (53.5 kg)   SpO2 99%   BMI 19.64 kg/m                 Constitutional: Pt appears in NAD               HENT: Head: NCAT.                Right Ear: External ear normal.                 Left Ear: External ear normal.                Eyes: . Pupils are equal, round, and reactive to light. Conjunctivae and EOM are normal               Nose: without d/c or deformity               Neck: Neck supple. Gross normal ROM               Cardiovascular: Normal rate and regular rhythm.                 Pulmonary/Chest: Effort normal and breath sounds without rales or wheezing.                Abd:  Soft, NT, ND, + BS, no organomegaly; also has 3 cm area of slightly raised lipoma tissue nontender, no overlying skin change                Neurological: Pt is alert. At baseline orientation, motor grossly intact               Skin: Skin is warm. No rashes, no other new lesions, LE edema - none               Psychiatric: Pt behavior is normal without agitation   Micro: none  Cardiac tracings I have personally interpreted today:  none  Pertinent Radiological findings (summarize): none   Lab Results  Component Value Date   WBC 6.8 11/03/2023   HGB 10.9 (L) 11/03/2023   HCT 33.4 (L) 11/03/2023   PLT 252 11/03/2023   GLUCOSE 97 11/03/2023   CHOL 179 04/17/2023   TRIG 116.0 04/17/2023   HDL 65.80 04/17/2023   LDLDIRECT 152.0 10/25/2015   LDLCALC 90 04/17/2023   ALT 11 11/03/2023   AST 23 11/03/2023   NA 139 11/03/2023   K 4.2 11/03/2023   CL 103 11/03/2023   CREATININE 0.88 11/03/2023   BUN 13 11/03/2023   CO2 23 11/03/2023   TSH 1.58 04/17/2023   INR 1.1 06/21/2023   HGBA1C 5.5 04/17/2023   MICROALBUR 3.5 (H) 04/17/2023   Assessment/Plan:  Hannah Madden is a 89 y.o. Black or African American [2] female with  has a past medical history of Alopecia, Carotid artery occlusion, GERD (gastroesophageal reflux disease), Glaucoma, H/O hiatal hernia, Hypercholesteremia, Pneumonia (03/03/1958), Syncope (08/20/2020), UTI (urinary tract infection) (06/20/2023), and Vitamin D  deficiency (12/24/2019).  B12 deficiency Lab Results  Component Value Date   VITAMINB12 94 (L) 04/17/2023   Low, to start oral replacement - b12 1000 mcg qd   HTN (hypertension) BP Readings from Last 3 Encounters:  11/10/23 (!) 142/82  11/03/23 (!) 154/72  10/10/23 (!) 161/90   Uncontrolled, no  willing to start losartan  50 mg every day, f/u next visit   Lipoma D/w pt - benign, and ok to follow  Vitamin D  deficiency Last vitamin D  Lab Results  Component Value Date   VD25OH 13.39 (L) 04/17/2023   Low, to start oral replacement   Pure hypercholesterolemia Lab Results  Component Value Date   LDLCALC 90 04/17/2023   Stable, pt to  continue low chol diet  Followup: Return in about 6 months (around 05/09/2024).  Lynwood Rush, MD 11/10/2023 7:49 PM [facility]  Primary Care - [facility]

## 2023-11-11 ENCOUNTER — Encounter: Payer: Self-pay | Admitting: Internal Medicine

## 2023-11-12 MED ORDER — COVID-19 MRNA VACC (MODERNA) 50 MCG/0.5ML IM SUSP: 0.5000 mL | Freq: Once | INTRAMUSCULAR | 0 refills | Status: AC

## 2023-11-13 DIAGNOSIS — E042 Nontoxic multinodular goiter: Secondary | ICD-10-CM | POA: Diagnosis not present

## 2023-11-13 DIAGNOSIS — I6529 Occlusion and stenosis of unspecified carotid artery: Secondary | ICD-10-CM | POA: Diagnosis not present

## 2023-11-13 DIAGNOSIS — K219 Gastro-esophageal reflux disease without esophagitis: Secondary | ICD-10-CM | POA: Diagnosis not present

## 2023-11-13 DIAGNOSIS — E559 Vitamin D deficiency, unspecified: Secondary | ICD-10-CM | POA: Diagnosis not present

## 2023-11-13 DIAGNOSIS — C7B8 Other secondary neuroendocrine tumors: Secondary | ICD-10-CM | POA: Diagnosis not present

## 2023-11-13 DIAGNOSIS — G319 Degenerative disease of nervous system, unspecified: Secondary | ICD-10-CM | POA: Diagnosis not present

## 2023-11-13 DIAGNOSIS — D849 Immunodeficiency, unspecified: Secondary | ICD-10-CM | POA: Diagnosis not present

## 2023-11-13 DIAGNOSIS — E7800 Pure hypercholesterolemia, unspecified: Secondary | ICD-10-CM | POA: Diagnosis not present

## 2023-11-13 DIAGNOSIS — H409 Unspecified glaucoma: Secondary | ICD-10-CM | POA: Diagnosis not present

## 2023-11-17 ENCOUNTER — Encounter (HOSPITAL_COMMUNITY)
Admission: RE | Admit: 2023-11-17 | Discharge: 2023-11-17 | Disposition: A | Discharge status: Home / Self Care | Source: Ambulatory Visit | Attending: Oncology | Admitting: Oncology

## 2023-11-17 DIAGNOSIS — R932 Abnormal findings on diagnostic imaging of liver and biliary tract: Secondary | ICD-10-CM | POA: Diagnosis not present

## 2023-11-17 DIAGNOSIS — C7B8 Other secondary neuroendocrine tumors: Secondary | ICD-10-CM | POA: Diagnosis not present

## 2023-11-17 DIAGNOSIS — C7A8 Other malignant neuroendocrine tumors: Secondary | ICD-10-CM | POA: Diagnosis not present

## 2023-11-17 MED ORDER — COPPER CU 64 DOTATATE 1 MCI/ML IV SOLN
4.0000 | Freq: Once | INTRAVENOUS | Status: AC
Start: 2023-11-17 — End: 2023-11-17
  Administered 2023-11-17: 3.89 via INTRAVENOUS

## 2023-11-18 ENCOUNTER — Encounter: Payer: Self-pay | Admitting: Orthopedic Surgery

## 2023-11-18 ENCOUNTER — Ambulatory Visit: Admitting: Orthopedic Surgery

## 2023-11-18 DIAGNOSIS — M25562 Pain in left knee: Secondary | ICD-10-CM

## 2023-11-18 DIAGNOSIS — M25561 Pain in right knee: Secondary | ICD-10-CM | POA: Diagnosis not present

## 2023-11-18 DIAGNOSIS — M170 Bilateral primary osteoarthritis of knee: Secondary | ICD-10-CM

## 2023-11-18 DIAGNOSIS — M1712 Unilateral primary osteoarthritis, left knee: Secondary | ICD-10-CM

## 2023-11-18 MED ORDER — HYALURONAN 88 MG/4ML IX SOSY
88.0000 mg | PREFILLED_SYRINGE | INTRA_ARTICULAR | Status: AC | PRN
  Administered 2023-11-18: 88 mg via INTRA_ARTICULAR

## 2023-11-18 MED ORDER — LIDOCAINE HCL 1 % IJ SOLN
5.0000 mL | INTRAMUSCULAR | Status: AC | PRN
  Administered 2023-11-18: 5 mL

## 2023-11-18 NOTE — Progress Notes (Signed)
   Procedure Note  Patient: Hannah Madden             Date of Birth: 11-Dec-1934           MRN: [id number]             Visit Date: 11/18/2023  Procedures: Visit Diagnoses:  1. Pain in both knees, unspecified chronicity     Large Joint Inj: L knee on 11/18/2023 4:37 PM Indications: pain, joint swelling and diagnostic evaluation Details: 18 G 1.5 in needle, superolateral approach  Arthrogram: No  Medications: 5 mL lidocaine  1 %; 88 mg Hyaluronan 88 MG/4ML Outcome: tolerated well, no immediate complications Procedure, treatment alternatives, risks and benefits explained, specific risks discussed. Consent was given by the patient. Immediately prior to procedure a time out was called to verify the correct patient, procedure, equipment, support staff and site/side marked as required. Patient was prepped and draped in the usual sterile fashion.    Lot #[id number] Carole did marginally well with the cortisone.  We will inject Monovisc today.  See how she does with that.  Could consider cortisone if this wears off. This patient is diagnosed with osteoarthritis of the knee(s).    Radiographs show evidence of joint space narrowing, osteophytes, subchondral sclerosis and/or subchondral cysts.  This patient has knee pain which interferes with functional and activities of daily living.    This patient has experienced inadequate response, adverse effects and/or intolerance with conservative treatments such as acetaminophen , NSAIDS, topical creams, physical therapy or regular exercise, knee bracing and/or weight loss.   This patient has experienced inadequate response or has a contraindication to intra articular steroid injections for at least 3 months.   This patient is not scheduled to have a total knee replacement within 6 months of starting treatment with viscosupplementation.

## 2023-11-20 DIAGNOSIS — E7800 Pure hypercholesterolemia, unspecified: Secondary | ICD-10-CM | POA: Diagnosis not present

## 2023-11-20 DIAGNOSIS — I6529 Occlusion and stenosis of unspecified carotid artery: Secondary | ICD-10-CM | POA: Diagnosis not present

## 2023-11-20 DIAGNOSIS — C7B8 Other secondary neuroendocrine tumors: Secondary | ICD-10-CM | POA: Diagnosis not present

## 2023-11-20 DIAGNOSIS — D849 Immunodeficiency, unspecified: Secondary | ICD-10-CM | POA: Diagnosis not present

## 2023-11-20 DIAGNOSIS — E559 Vitamin D deficiency, unspecified: Secondary | ICD-10-CM | POA: Diagnosis not present

## 2023-11-20 DIAGNOSIS — H409 Unspecified glaucoma: Secondary | ICD-10-CM | POA: Diagnosis not present

## 2023-11-20 DIAGNOSIS — K219 Gastro-esophageal reflux disease without esophagitis: Secondary | ICD-10-CM | POA: Diagnosis not present

## 2023-11-20 DIAGNOSIS — G319 Degenerative disease of nervous system, unspecified: Secondary | ICD-10-CM | POA: Diagnosis not present

## 2023-11-20 DIAGNOSIS — E042 Nontoxic multinodular goiter: Secondary | ICD-10-CM | POA: Diagnosis not present

## 2023-11-24 DIAGNOSIS — H43813 Vitreous degeneration, bilateral: Secondary | ICD-10-CM | POA: Diagnosis not present

## 2023-11-24 DIAGNOSIS — H34811 Central retinal vein occlusion, right eye, with macular edema: Secondary | ICD-10-CM | POA: Diagnosis not present

## 2023-11-24 DIAGNOSIS — H401110 Primary open-angle glaucoma, right eye, stage unspecified: Secondary | ICD-10-CM | POA: Diagnosis not present

## 2023-11-24 DIAGNOSIS — H35371 Puckering of macula, right eye: Secondary | ICD-10-CM | POA: Diagnosis not present

## 2023-12-01 ENCOUNTER — Inpatient Hospital Stay

## 2023-12-01 ENCOUNTER — Encounter: Payer: Self-pay | Admitting: Oncology

## 2023-12-01 ENCOUNTER — Inpatient Hospital Stay (HOSPITAL_BASED_OUTPATIENT_CLINIC_OR_DEPARTMENT_OTHER): Admitting: Oncology

## 2023-12-01 VITALS — BP 153/89 | HR 75 | Temp 97.8°F | Resp 18 | Ht 65.0 in | Wt 116.2 lb

## 2023-12-01 DIAGNOSIS — N2889 Other specified disorders of kidney and ureter: Secondary | ICD-10-CM

## 2023-12-01 DIAGNOSIS — C7B8 Other secondary neuroendocrine tumors: Secondary | ICD-10-CM

## 2023-12-01 DIAGNOSIS — H409 Unspecified glaucoma: Secondary | ICD-10-CM | POA: Diagnosis not present

## 2023-12-01 DIAGNOSIS — E785 Hyperlipidemia, unspecified: Secondary | ICD-10-CM | POA: Diagnosis not present

## 2023-12-01 DIAGNOSIS — Z79899 Other long term (current) drug therapy: Secondary | ICD-10-CM | POA: Diagnosis not present

## 2023-12-01 DIAGNOSIS — K7689 Other specified diseases of liver: Secondary | ICD-10-CM | POA: Diagnosis not present

## 2023-12-01 LAB — CBC WITH DIFFERENTIAL (CANCER CENTER ONLY)
Abs Immature Granulocytes: 0.03 K/uL (ref 0.00–0.07)
Basophils Absolute: 0 K/uL (ref 0.0–0.1)
Basophils Relative: 0 %
Eosinophils Absolute: 0.7 K/uL — ABNORMAL HIGH (ref 0.0–0.5)
Eosinophils Relative: 7 %
HCT: 34.9 % — ABNORMAL LOW (ref 36.0–46.0)
Hemoglobin: 11.3 g/dL — ABNORMAL LOW (ref 12.0–15.0)
Immature Granulocytes: 0 %
Lymphocytes Relative: 31 %
Lymphs Abs: 2.8 K/uL (ref 0.7–4.0)
MCH: 29.1 pg (ref 26.0–34.0)
MCHC: 32.4 g/dL (ref 30.0–36.0)
MCV: 89.9 fL (ref 80.0–100.0)
Monocytes Absolute: 0.6 K/uL (ref 0.1–1.0)
Monocytes Relative: 7 %
Neutro Abs: 4.9 K/uL (ref 1.7–7.7)
Neutrophils Relative %: 55 %
Platelet Count: 264 K/uL (ref 150–400)
RBC: 3.88 MIL/uL (ref 3.87–5.11)
RDW: 13.9 % (ref 11.5–15.5)
WBC Count: 9 K/uL (ref 4.0–10.5)
nRBC: 0 % (ref 0.0–0.2)

## 2023-12-01 LAB — CMP (CANCER CENTER ONLY)
ALT: 13 U/L (ref 0–44)
AST: 25 U/L (ref 15–41)
Albumin: 4.8 g/dL (ref 3.5–5.0)
Alkaline Phosphatase: 102 U/L (ref 38–126)
Anion gap: 14 (ref 5–15)
BUN: 16 mg/dL (ref 8–23)
CO2: 22 mmol/L (ref 22–32)
Calcium: 10.3 mg/dL (ref 8.9–10.3)
Chloride: 102 mmol/L (ref 98–111)
Creatinine: 0.87 mg/dL (ref 0.44–1.00)
GFR, Estimated: >60 mL/min (ref >60)
Glucose, Bld: 101 mg/dL — ABNORMAL HIGH (ref 70–99)
Potassium: 4.6 mmol/L (ref 3.5–5.1)
Sodium: 138 mmol/L (ref 135–145)
Total Bilirubin: 0.5 mg/dL (ref 0.0–1.2)
Total Protein: 7.9 g/dL (ref 6.5–8.1)

## 2023-12-01 LAB — MAGNESIUM: Magnesium: 1.9 mg/dL (ref 1.7–2.4)

## 2023-12-01 MED ORDER — OCTREOTIDE ACETATE 20 MG IM KIT
20.0000 mg | PACK | Freq: Once | INTRAMUSCULAR | Status: AC
  Administered 2023-12-01: 20 mg via INTRAMUSCULAR
  Filled 2023-12-01: qty 1

## 2023-12-01 NOTE — Assessment & Plan Note (Addendum)
 Please review oncology history for additional details and timeline of events.    CT abdomen and pelvis on 06/20/2023 showed multiple large heterogeneous liver lesions, largest measuring 10.6 cm in the right hepatic lobe and 5.7 cm in the left hepatic lobe.  Also noted was heterogeneous and has a mass in the superior pole of the right kidney measuring 3.5 cm, concerning for renal cell carcinoma.  Mesenteric mass or lymphadenopathy in the left central mesentery measuring 3.9 cm.  CT chest showed no evidence of metastatic disease in the chest.  On 06/22/2023, she underwent ultrasound-guided biopsy of the liver lesion.  Pathology showed well-differentiated neuroendocrine tumor, grade 1, Ki-67 index of 2%.  The neoplastic cells were diffusely and strongly positive for the neuroendocrine markers chromogranin and synaptic ficin.  They were negative for cytokeratin 7 and cytokeratin 20.  The tumor exhibits focal patchy nonspecific positivity for PAX 8 which is of uncertain significance.    Previously I discussed diagnosis, prognosis, plan of care, treatment options.  Reviewed NCCN guidelines.  Stage IV disease because of extensive liver metastatic disease.  The tumor is well-differentiated, low-grade, with a Ki-67 of 2%, indicating slow growth.  She does not have any carcinoid symptoms.  Despite its indolent nature, the significant hepatic tumor burden necessitates treatment to prevent liver function compromise. The primary site is suspected to be the gut lining but is not definitively identified. The tumor is not expected to significantly affect life expectancy. Treatment aims to control disease progression and prevent further spread, as a complete cure is not feasible due to extensive liver involvement.  On her initial consultation with us  on 06/30/2023, chromogranin A was elevated at 1495 ng/mL.  Request submitted for PET dotatate scan.    On 07/09/2023, PET dotatate scan showed bilobar tracer avid liver  metastases, large lesion measuring 10.7 x 8.5 cm in the right lobe, 6.4 x 3.8 cm in the left lobe.  Multiple tracer avid lymph nodes identified within the small bowel mesentery, compatible with nodal metastasis.  Focal area of increased radiotracer uptake localized to the loop of small bowel within the left lower quadrant, likely small bowel primary.  No uptake noted in the right kidney lesion.   Patient did not have carcinoid symptoms.  However given the burden of disease, decision was made to proceed with octreotide  injections, which she started from 07/15/2023.  She tolerated the injections well.  Dose given today.  Plan to continue monthly.   She had urology evaluation for the kidney mass.  She was not deemed to be a surgical candidate.  Cystoscopy showed no bladder tumor.  Surveillance versus IR evaluation for possible ablation was recommended.  Referral sent to IR Dr. Alona and I also discussed her case with him previously over the phone.  Intervention is currently on hold.  Recently she was hospitalized with some dizziness/presyncopal episodes and blood pressure fluctuations.  These could be a manifestation of metastatic neuroendocrine tumor related carcinoid symptoms.  Restaging PET dotatate scan on 11/17/2023 showed overall stable disease with slight improvement in some of the lesions.  She does not have any major cognitive symptoms currently.  Plan is to continue octreotide  injections monthly.  Proceed with octreotide  20 mg dose today.  RTC in 4 weeks for octreotide  injection only.  RTC in 8 weeks for labs, office visit and octreotide  injection

## 2023-12-01 NOTE — Assessment & Plan Note (Signed)
 Tumor located at the upper pole of the right kidney, measuring approximately 3.9 cm. Imaging suggests renal carcinoma, but further evaluation is required. Urology will assess the possibility of surgical resection versus ablation.  She does have an appointment coming up with their office next week.    The tumor is distinct from the neuroendocrine tumor and is suspected to be primary renal cancer. Surgical resection is preferred if feasible to obtain tissue for definitive diagnosis and to assess the grade of the tumor.   She had urology evaluation for the kidney mass.  She was not deemed to be a surgical candidate.  Cystoscopy showed no bladder tumor.  Surveillance versus IR evaluation for possible ablation was recommended.  Referral sent to IR Dr. Alona.  Intervention currently on hold

## 2023-12-01 NOTE — Patient Instructions (Signed)
 Octreotide Injection Solution What is this medication? OCTREOTIDE (ok TREE oh tide) treats high levels of growth hormone (acromegaly). It works by reducing the amount of growth hormone your body makes. This reduces symptoms and the risk of health problems caused by too much growth hormone, such as diabetes and heart disease. It may also be used to treat diarrhea caused by neuroendocrine tumors. It works by slowing down the release of serotonin from the tumor cells. This reduces the number of bowel movements you have. This medicine may be used for other purposes; ask your health care provider or pharmacist if you have questions. COMMON BRAND NAME(S): Berline Lopes, Sandostatin What should I tell my care team before I take this medication? They need to know if you have any of these conditions: Diabetes Gallbladder disease Heart disease Kidney disease Liver disease Pancreatic disease Thyroid disease An unusual or allergic reaction to octreotide, other medications, foods, dyes, or preservatives Pregnant or trying to get pregnant Breastfeeding How should I use this medication? This medication is injected under the skin or into a vein. It is usually given by your care team in a hospital or clinic setting. If you get this medication at home, you will be taught how to prepare and give it. Use exactly as directed. Take it as directed on the prescription label at the same time every day. Keep taking it unless your care team tells you to stop. Allow the injection solution to come to room temperature before use. Do not warm it artificially. It is important that you put your used needles and syringes in a special sharps container. Do not put them in a trash can. If you do not have a sharps container, call your pharmacist or care team to get one. Talk to your care team about the use of this medication in children. Special care may be needed. Overdosage: If you think you have taken too much of this medicine  contact a poison control center or emergency room at once. NOTE: This medicine is only for you. Do not share this medicine with others. What if I miss a dose? If you miss a dose, take it as soon as you can. If it is almost time for your next dose, take only that dose. Do not take double or extra doses. What may interact with this medication? Bromocriptine Certain medications for blood pressure, heart disease, irregular heartbeat Cyclosporine Diuretics Medications for diabetes, including insulin Quinidine This list may not describe all possible interactions. Give your health care provider a list of all the medicines, herbs, non-prescription drugs, or dietary supplements you use. Also tell them if you smoke, drink alcohol, or use illegal drugs. Some items may interact with your medicine. What should I watch for while using this medication? Visit your care team for regular checks on your progress. Tell your care team if your symptoms do not start to get better or if they get worse. To help reduce irritation at the injection site, use a different site for each injection and make sure the solution is at room temperature before use. This medication may cause decreases in blood sugar. Signs of low blood sugar include chills, cool, pale skin or cold sweats, drowsiness, extreme hunger, fast heartbeat, headache, nausea, nervousness or anxiety, shakiness, trembling, unsteadiness, tiredness, or weakness. Contact your care team right away if you experience any of these symptoms. This medication may increase blood sugar. The risk may be higher in patients who already have diabetes. Ask your care team what you can  do to lower your risk of diabetes while taking this medication. You should make sure you get enough vitamin B12 while you are taking this medication. Discuss the foods you eat and the vitamins you take with your care team. What side effects may I notice from receiving this medication? Side effects that  you should report to your care team as soon as possible: Allergic reactions--skin rash, itching, hives, swelling of the face, lips, tongue, or throat Gallbladder problems--severe stomach pain, nausea, vomiting, fever Heart rhythm changes--fast or irregular heartbeat, dizziness, feeling faint or lightheaded, chest pain, trouble breathing High blood sugar (hyperglycemia)--increased thirst or amount of urine, unusual weakness or fatigue, blurry vision Low blood sugar (hypoglycemia)--tremors or shaking, anxiety, sweating, cold or clammy skin, confusion, dizziness, rapid heartbeat Low thyroid levels (hypothyroidism)--unusual weakness or fatigue, increased sensitivity to cold, constipation, hair loss, dry skin, weight gain, feelings of depression Low vitamin B12 level--pain, tingling, or numbness in the hands or feet, muscle weakness, dizziness, confusion, trouble concentrating Oily or light-colored stools, diarrhea, bloating, weight loss Pancreatitis--severe stomach pain that spreads to your back or gets worse after eating or when touched, fever, nausea, vomiting Slow heartbeat--dizziness, feeling faint or lightheaded, confusion, trouble breathing, unusual weakness or fatigue Side effects that usually do not require medical attention (report these to your care team if they continue or are bothersome): Diarrhea Dizziness Headache Nausea Pain, redness, or irritation at injection site Stomach pain This list may not describe all possible side effects. Call your doctor for medical advice about side effects. You may report side effects to FDA at 1-800-FDA-1088. Where should I keep my medication? Keep out of the reach of children and pets. Store in the refrigerator. Protect from light. Allow to come to room temperature naturally. Do not use artificial heat. If protected from light, the injection may be stored between 20 and 30 degrees C (70 and 86 degrees F) for 14 days. After the initial use, throw away  any unused portion of a multiple dose vial after 14 days. Get rid of any unused portions of the ampules after use. To get rid of medications that are no longer needed or have expired: Take the medication to a medication take-back program. Ask your pharmacy or law enforcement to find a location. If you cannot return the medication, ask your pharmacist or care team how to get rid of the medication safely. NOTE: This sheet is a summary. It may not cover all possible information. If you have questions about this medicine, talk to your doctor, pharmacist, or health care provider.  2024 Elsevier/Gold Standard (2023-01-30 00:00:00)

## 2023-12-01 NOTE — Progress Notes (Signed)
 [facility]  ONCOLOGY CLINIC PROGRESS NOTE   Patient Care Team: [name], MD as PCP - General (Internal Medicine) Jakie Alm SAUNDERS, MD as Consulting Physician (Gastroenterology) [place], Heather, OD (Optometry) Livingston Rigg, MD as Consulting Physician (Dermatology)  PATIENT NAME: Hannah Madden   MR#: [id number] DOB: 01-Apr-1934  Date of visit: 12/01/2023   ASSESSMENT & PLAN:   Hannah Madden is a 89 y.o.  lady with a past medical history of GERD, glaucoma, dyslipidemia, hiatal hernia, alopecia, was referred to our clinic for newly diagnosed neuroendocrine tumor with multiple liver mets.  Also has renal mass, concerning for separate renal primary.   Metastatic malignant neuroendocrine tumor to liver [facility]) Please review oncology history for additional details and timeline of events.    CT abdomen and pelvis on 06/20/2023 showed multiple large heterogeneous liver lesions, largest measuring 10.6 cm in the right hepatic lobe and 5.7 cm in the left hepatic lobe.  Also noted was heterogeneous and has a mass in the superior pole of the right kidney measuring 3.5 cm, concerning for renal cell carcinoma.  Mesenteric mass or lymphadenopathy in the left central mesentery measuring 3.9 cm.  CT chest showed no evidence of metastatic disease in the chest.  On 06/22/2023, she underwent ultrasound-guided biopsy of the liver lesion.  Pathology showed well-differentiated neuroendocrine tumor, grade 1, Ki-67 index of 2%.  The neoplastic cells were diffusely and strongly positive for the neuroendocrine markers chromogranin and synaptic ficin.  They were negative for cytokeratin 7 and cytokeratin 20.  The tumor exhibits focal patchy nonspecific positivity for PAX 8 which is of uncertain significance.    Previously I discussed diagnosis, prognosis, plan of care, treatment options.  Reviewed NCCN guidelines.  Stage IV disease because of extensive liver metastatic disease.  The tumor is  well-differentiated, low-grade, with a Ki-67 of 2%, indicating slow growth.  She does not have any carcinoid symptoms.  Despite its indolent nature, the significant hepatic tumor burden necessitates treatment to prevent liver function compromise. The primary site is suspected to be the gut lining but is not definitively identified. The tumor is not expected to significantly affect life expectancy. Treatment aims to control disease progression and prevent further spread, as a complete cure is not feasible due to extensive liver involvement.  On her initial consultation with us  on 06/30/2023, chromogranin A was elevated at 1495 ng/mL.  Request submitted for PET dotatate scan.    On 07/09/2023, PET dotatate scan showed bilobar tracer avid liver metastases, large lesion measuring 10.7 x 8.5 cm in the right lobe, 6.4 x 3.8 cm in the left lobe.  Multiple tracer avid lymph nodes identified within the small bowel mesentery, compatible with nodal metastasis.  Focal area of increased radiotracer uptake localized to the loop of small bowel within the left lower quadrant, likely small bowel primary.  No uptake noted in the right kidney lesion.   Patient did not have carcinoid symptoms.  However given the burden of disease, decision was made to proceed with octreotide  injections, which she started from 07/15/2023.  She tolerated the injections well.  Dose given today.  Plan to continue monthly.   She had urology evaluation for the kidney mass.  She was not deemed to be a surgical candidate.  Cystoscopy showed no bladder tumor.  Surveillance versus IR evaluation for possible ablation was recommended.  Referral sent to IR Dr. Alona and I also discussed her case with him previously over the phone.  Intervention is  currently on hold.  Recently she was hospitalized with some dizziness/presyncopal episodes and blood pressure fluctuations.  These could be a manifestation of metastatic neuroendocrine tumor related carcinoid  symptoms.  Restaging PET dotatate scan on 11/17/2023 showed overall stable disease with slight improvement in some of the lesions.  She does not have any major cognitive symptoms currently.  Plan is to continue octreotide  injections monthly.  Proceed with octreotide  20 mg dose today.  RTC in 4 weeks for octreotide  injection only.  RTC in 8 weeks for labs, office visit and octreotide  injection  Right kidney mass Tumor located at the upper pole of the right kidney, measuring approximately 3.9 cm. Imaging suggests renal carcinoma, but further evaluation is required. Urology will assess the possibility of surgical resection versus ablation.  She does have an appointment coming up with their office next week.    The tumor is distinct from the neuroendocrine tumor and is suspected to be primary renal cancer. Surgical resection is preferred if feasible to obtain tissue for definitive diagnosis and to assess the grade of the tumor.   She had urology evaluation for the kidney mass.  She was not deemed to be a surgical candidate.  Cystoscopy showed no bladder tumor.  Surveillance versus IR evaluation for possible ablation was recommended.  Referral sent to IR Dr. Alona.  Intervention currently on hold   I reviewed lab results and outside records for this visit and discussed relevant results with the patient. Diagnosis, plan of care and treatment options were also discussed in detail with the patient. Opportunity provided to ask questions and answers provided to her apparent satisfaction. Provided instructions to call our clinic with any problems, questions or concerns prior to return visit. I recommended to continue follow-up with PCP and sub-specialists. She verbalized understanding and agreed with the plan.   NCCN guidelines have been consulted in the planning of this patient's care.  I spent a total of 30 minutes during this encounter with the patient including review of chart and various tests  results, discussions about plan of care and coordination of care plan.   Chinita Patten, MD  12/01/2023 4:36 PM  [facility] - A DEPT OF [facility] [address] [place] [address] Dept: [phone] Dept Fax: [phone]    CHIEF COMPLAINT/ REASON FOR VISIT:   Well-differentiated neuroendocrine tumor, grade 1, with extensive liver metastatic disease, presumed to be small bowel primary.  Right kidney mass, likely separate primary.  Current Treatment: Monthly octreotide  injections started from 07/15/2023.  INTERVAL HISTORY:   Discussed the use of AI scribe software for clinical note transcription with the patient, who gave verbal consent to proceed.  History of Present Illness  OLYMPIA ADELSBERGER is an 89 year old female who presents for follow-up after a PET scan.  The recent PET scan shows stability in most of the existing lesions, with one lesion decreasing in size from 5.9 cm to 5.6 cm. The lesions are also less bright in some areas.  She is currently on a base dose of 20 mg of octreotide  injections.  She mentions a lipoma on her back, identified by her primary care physician. It is not causing any discomfort or pressure symptoms.  Recent blood work shows improvement in hemoglobin levels, now at 11.3 g/dL, up from previous values of 10 and 10.9 g/dL. White blood cell count and platelets are within normal limits, and previous chemistry results were unremarkable. She is eating  well and has no new symptoms to report.   I have reviewed the past medical history, past surgical history, social history and family history with the patient and they are unchanged from previous note.  HISTORY OF PRESENT ILLNESS:   ONCOLOGY HISTORY:   Patient presented to the ED with complaints of worsening abdominal pain, back pain and near syncope on 06/19/2023 and was hospitalized for further evaluation.   CT abdomen and  pelvis on 06/20/2023 showed multiple large heterogeneous liver lesions, largest measuring 10.6 cm in the right hepatic lobe and 5.7 cm in the left hepatic lobe.  Also noted was heterogeneous and has a mass in the superior pole of the right kidney measuring 3.5 cm, concerning for renal cell carcinoma.  Mesenteric mass or lymphadenopathy in the left central mesentery measuring 3.9 cm.  CT chest showed no evidence of metastatic disease in the chest.   Urology and interventional radiology was consulted.  Though she may have renal cell carcinoma, liver metastatic disease was not felt to be related as it is not common for renal cell carcinoma to metastasize to liver.  She may have a lesion in the bladder based on the CT imaging and hence outpatient cystoscopy and further workup was planned by urology.   On 06/22/2023, she underwent ultrasound-guided biopsy of the liver lesion.  Pathology showed well-differentiated neuroendocrine tumor, grade 1, Ki-67 index of 2%.  The neoplastic cells were diffusely and strongly positive for the neuroendocrine markers chromogranin and synaptic ficin.  They were negative for cytokeratin 7 and cytokeratin 20.  The tumor exhibits focal patchy nonspecific positivity for PAX 8 which is of uncertain significance.     On her initial consultation with us  on 06/30/2023, chromogranin A was elevated at 1495 ng/mL.  Request submitted for PET dotatate scan.    On 07/09/2023, PET dotatate scan showed bilobar tracer avid liver metastases, large lesion measuring 10.7 x 8.5 cm in the right lobe, 6.4 x 3.8 cm in the left lobe.  Multiple tracer avid lymph nodes identified within the small bowel mesentery, compatible with nodal metastasis.  Focal area of increased radiotracer uptake localized to the loop of small bowel within the left lower quadrant, likely small bowel primary.  No uptake noted in the right kidney lesion.   Patient does not have carcinoid symptoms.  However given the burden of disease,  decision was made to proceed with octreotide  injections, which she started from 07/15/2023.  Plan to continue monthly.   She had urology evaluation for the kidney mass.  She was not deemed to be a surgical candidate.  Cystoscopy showed no bladder tumor.  Surveillance versus IR evaluation for possible ablation was recommended.  Referral sent to IR Dr. Alona.  Restaging PET dotatate scan on 11/17/2023 showed overall stable disease with slight improvement in some of the lesions.  Plan to continue octreotide  injections monthly at 20 mg dose.   REVIEW OF SYSTEMS:   Review of Systems - Oncology  All other pertinent systems were reviewed with the patient and are negative.  ALLERGIES: She is allergic to atorvastatin, macrobid [nitrofurantoin], penicillins, and ciprofloxacin .  MEDICATIONS:  Current Outpatient Medications  Medication Sig Dispense Refill   acetaminophen  (TYLENOL ) 500 MG tablet Take 500 mg by mouth every 6 (six) hours as needed for moderate pain (pain score 4-6).     brimonidine -timolol  (COMBIGAN ) 0.2-0.5 % ophthalmic solution Place 1 drop into the right eye 2 (two) times daily.     brinzolamide  (AZOPT ) 1 % ophthalmic suspension Place  1 drop into the right eye 2 (two) times daily.     cyanocobalamin  (VITAMIN B12) 1000 MCG tablet Take 1 tablet (1,000 mcg total) by mouth daily. 30 tablet 0   ondansetron  (ZOFRAN ) 4 MG tablet Take 1 tablet (4 mg total) by mouth every 8 (eight) hours as needed for nausea or vomiting. 90 tablet 3   pantoprazole  (PROTONIX ) 40 MG tablet Take 1 tablet (40 mg total) by mouth daily. 90 tablet 3   tobramycin  (TOBREX ) 0.3 % ophthalmic solution Place 1 drop into the right eye as needed (before eye injection).     triamcinolone  cream (KENALOG ) 0.1 % Apply 1 Application topically 2 (two) times daily. 30 g 1   losartan  (COZAAR ) 50 MG tablet Take 1 tablet (50 mg total) by mouth daily. (Patient not taking: Reported on 12/01/2023) 90 tablet 3   No current  facility-administered medications for this visit.     VITALS:   Blood pressure (!) 153/89, pulse 75, temperature 97.8 F (36.6 C), temperature source Temporal, resp. rate 18, height 5' 5 (1.651 m), weight 116 lb 3.2 oz (52.7 kg), SpO2 100%.  Wt Readings from Last 3 Encounters:  12/01/23 116 lb 3.2 oz (52.7 kg)  11/10/23 118 lb (53.5 kg)  11/03/23 116 lb 8 oz (52.8 kg)    Body mass index is 19.34 kg/m.     Onc Performance Status - 12/01/23 0939       ECOG Perf Status   ECOG Perf Status Restricted in physically strenuous activity but ambulatory and able to carry out work of a light or sedentary nature, e.g., light house work, office work      KPS SCALE   KPS % SCORE Able to carry on normal activity, minor s/s of disease           PHYSICAL EXAM:   Physical Exam Constitutional:      General: She is not in acute distress.    Appearance: Normal appearance.  HENT:     Head: Normocephalic and atraumatic.  Cardiovascular:     Rate and Rhythm: Normal rate.  Pulmonary:     Effort: Pulmonary effort is normal. No respiratory distress.  Abdominal:     General: There is no distension.  Neurological:     General: No focal deficit present.     Mental Status: She is alert and oriented to person, place, and time.  Psychiatric:        Mood and Affect: Mood normal.        Behavior: Behavior normal.      LABORATORY DATA:   I have reviewed the data as listed.  Results for orders placed or performed in visit on 12/01/23  CBC with Differential (Cancer Center Only)  Result Value Ref Range   WBC Count 9.0 4.0 - 10.5 K/uL   RBC 3.88 3.87 - 5.11 MIL/uL   Hemoglobin 11.3 (L) 12.0 - 15.0 g/dL   HCT 65.0 (L) 63.9 - 53.9 %   MCV 89.9 80.0 - 100.0 fL   MCH 29.1 26.0 - 34.0 pg   MCHC 32.4 30.0 - 36.0 g/dL   RDW 86.0 88.4 - 84.4 %   Platelet Count 264 150 - 400 K/uL   nRBC 0.0 0.0 - 0.2 %   Neutrophils Relative % 55 %   Neutro Abs 4.9 1.7 - 7.7 K/uL   Lymphocytes Relative 31 %    Lymphs Abs 2.8 0.7 - 4.0 K/uL   Monocytes Relative 7 %   Monocytes Absolute 0.6 0.1 -  1.0 K/uL   Eosinophils Relative 7 %   Eosinophils Absolute 0.7 (H) 0.0 - 0.5 K/uL   Basophils Relative 0 %   Basophils Absolute 0.0 0.0 - 0.1 K/uL   Immature Granulocytes 0 %   Abs Immature Granulocytes 0.03 0.00 - 0.07 K/uL  CMP (Cancer Center only)  Result Value Ref Range   Sodium 138 135 - 145 mmol/L   Potassium 4.6 3.5 - 5.1 mmol/L   Chloride 102 98 - 111 mmol/L   CO2 22 22 - 32 mmol/L   Glucose, Bld 101 (H) 70 - 99 mg/dL   BUN 16 8 - 23 mg/dL   Creatinine 9.12 9.55 - 1.00 mg/dL   Calcium  10.3 8.9 - 10.3 mg/dL   Total Protein 7.9 6.5 - 8.1 g/dL   Albumin 4.8 3.5 - 5.0 g/dL   AST 25 15 - 41 U/L   ALT 13 0 - 44 U/L   Alkaline Phosphatase 102 38 - 126 U/L   Total Bilirubin 0.5 0.0 - 1.2 mg/dL   GFR, Estimated >39 >39 mL/min   Anion gap 14 5 - 15  Magnesium   Result Value Ref Range   Magnesium  1.9 1.7 - 2.4 mg/dL       RADIOGRAPHIC STUDIES:  No recent pertinent imaging available.    CODE STATUS:  Code Status History     Date Active Date Inactive Code Status Order ID Comments User Context   06/20/2023 0855 06/23/2023 1921 Full Code [id number]  Celinda Alm Lot, MD Inpatient   06/20/2023 0855 06/20/2023 0855 Full Code [id number]  Celinda Alm Lot, MD Inpatient   09/12/2013 1422 09/13/2013 1539 Full Code [id number]  Addie Cordella Hamilton, MD Inpatient    Questions for Most Recent Historical Code Status (Order [id number])     Question Answer   By: Consent: discussion documented in EHR            Orders Placed This Encounter  Procedures   CBC with Differential (Cancer Center Only)    Standing Status:   Future    Expected Date:   01/26/2024    Expiration Date:   04/25/2024   CMP (Cancer Center only)    Standing Status:   Future    Expected Date:   01/26/2024    Expiration Date:   04/25/2024   Magnesium     Standing Status:   Future    Expected Date:   01/26/2024     Expiration Date:   04/25/2024     This document was completed utilizing speech recognition software. Grammatical errors, random word insertions, pronoun errors, and incomplete sentences are an occasional consequence of this system due to software limitations, ambient noise, and hardware issues. Any formal questions or concerns about the content, text or information contained within the body of this dictation should be directly addressed to the provider for clarification.

## 2023-12-16 ENCOUNTER — Telehealth: Payer: Self-pay | Admitting: Oncology

## 2023-12-16 ENCOUNTER — Telehealth: Payer: Self-pay

## 2023-12-16 NOTE — Telephone Encounter (Signed)
 Called PT back, regarding appts. Please call pt sister Valora before making appts

## 2023-12-16 NOTE — Telephone Encounter (Signed)
 Telephone call placed to patient's daughter, Valora, regarding request to reschedule the injection appointment on 12/30/2023. Unable to reach; voice message left. Dr. Autumn and [place], RN notified.

## 2023-12-30 ENCOUNTER — Inpatient Hospital Stay: Attending: Oncology

## 2023-12-30 ENCOUNTER — Ambulatory Visit

## 2023-12-30 VITALS — BP 146/56 | HR 81 | Temp 97.5°F | Resp 18

## 2023-12-30 DIAGNOSIS — C7B8 Other secondary neuroendocrine tumors: Secondary | ICD-10-CM | POA: Diagnosis not present

## 2023-12-30 DIAGNOSIS — Z79899 Other long term (current) drug therapy: Secondary | ICD-10-CM | POA: Insufficient documentation

## 2023-12-30 MED ORDER — OCTREOTIDE ACETATE 20 MG IM KIT
20.0000 mg | PACK | Freq: Once | INTRAMUSCULAR | Status: AC
  Administered 2023-12-30: 20 mg via INTRAMUSCULAR
  Filled 2023-12-30: qty 1

## 2023-12-30 NOTE — Patient Instructions (Signed)
 Octreotide Injection Solution What is this medication? OCTREOTIDE (ok TREE oh tide) treats high levels of growth hormone (acromegaly). It works by reducing the amount of growth hormone your body makes. This reduces symptoms and the risk of health problems caused by too much growth hormone, such as diabetes and heart disease. It may also be used to treat diarrhea caused by neuroendocrine tumors. It works by slowing down the release of serotonin from the tumor cells. This reduces the number of bowel movements you have. This medicine may be used for other purposes; ask your health care provider or pharmacist if you have questions. COMMON BRAND NAME(S): Berline Lopes, Sandostatin What should I tell my care team before I take this medication? They need to know if you have any of these conditions: Diabetes Gallbladder disease Heart disease Kidney disease Liver disease Pancreatic disease Thyroid disease An unusual or allergic reaction to octreotide, other medications, foods, dyes, or preservatives Pregnant or trying to get pregnant Breastfeeding How should I use this medication? This medication is injected under the skin or into a vein. It is usually given by your care team in a hospital or clinic setting. If you get this medication at home, you will be taught how to prepare and give it. Use exactly as directed. Take it as directed on the prescription label at the same time every day. Keep taking it unless your care team tells you to stop. Allow the injection solution to come to room temperature before use. Do not warm it artificially. It is important that you put your used needles and syringes in a special sharps container. Do not put them in a trash can. If you do not have a sharps container, call your pharmacist or care team to get one. Talk to your care team about the use of this medication in children. Special care may be needed. Overdosage: If you think you have taken too much of this medicine  contact a poison control center or emergency room at once. NOTE: This medicine is only for you. Do not share this medicine with others. What if I miss a dose? If you miss a dose, take it as soon as you can. If it is almost time for your next dose, take only that dose. Do not take double or extra doses. What may interact with this medication? Bromocriptine Certain medications for blood pressure, heart disease, irregular heartbeat Cyclosporine Diuretics Medications for diabetes, including insulin Quinidine This list may not describe all possible interactions. Give your health care provider a list of all the medicines, herbs, non-prescription drugs, or dietary supplements you use. Also tell them if you smoke, drink alcohol, or use illegal drugs. Some items may interact with your medicine. What should I watch for while using this medication? Visit your care team for regular checks on your progress. Tell your care team if your symptoms do not start to get better or if they get worse. To help reduce irritation at the injection site, use a different site for each injection and make sure the solution is at room temperature before use. This medication may cause decreases in blood sugar. Signs of low blood sugar include chills, cool, pale skin or cold sweats, drowsiness, extreme hunger, fast heartbeat, headache, nausea, nervousness or anxiety, shakiness, trembling, unsteadiness, tiredness, or weakness. Contact your care team right away if you experience any of these symptoms. This medication may increase blood sugar. The risk may be higher in patients who already have diabetes. Ask your care team what you can  do to lower your risk of diabetes while taking this medication. You should make sure you get enough vitamin B12 while you are taking this medication. Discuss the foods you eat and the vitamins you take with your care team. What side effects may I notice from receiving this medication? Side effects that  you should report to your care team as soon as possible: Allergic reactions--skin rash, itching, hives, swelling of the face, lips, tongue, or throat Gallbladder problems--severe stomach pain, nausea, vomiting, fever Heart rhythm changes--fast or irregular heartbeat, dizziness, feeling faint or lightheaded, chest pain, trouble breathing High blood sugar (hyperglycemia)--increased thirst or amount of urine, unusual weakness or fatigue, blurry vision Low blood sugar (hypoglycemia)--tremors or shaking, anxiety, sweating, cold or clammy skin, confusion, dizziness, rapid heartbeat Low thyroid levels (hypothyroidism)--unusual weakness or fatigue, increased sensitivity to cold, constipation, hair loss, dry skin, weight gain, feelings of depression Low vitamin B12 level--pain, tingling, or numbness in the hands or feet, muscle weakness, dizziness, confusion, trouble concentrating Oily or light-colored stools, diarrhea, bloating, weight loss Pancreatitis--severe stomach pain that spreads to your back or gets worse after eating or when touched, fever, nausea, vomiting Slow heartbeat--dizziness, feeling faint or lightheaded, confusion, trouble breathing, unusual weakness or fatigue Side effects that usually do not require medical attention (report these to your care team if they continue or are bothersome): Diarrhea Dizziness Headache Nausea Pain, redness, or irritation at injection site Stomach pain This list may not describe all possible side effects. Call your doctor for medical advice about side effects. You may report side effects to FDA at 1-800-FDA-1088. Where should I keep my medication? Keep out of the reach of children and pets. Store in the refrigerator. Protect from light. Allow to come to room temperature naturally. Do not use artificial heat. If protected from light, the injection may be stored between 20 and 30 degrees C (70 and 86 degrees F) for 14 days. After the initial use, throw away  any unused portion of a multiple dose vial after 14 days. Get rid of any unused portions of the ampules after use. To get rid of medications that are no longer needed or have expired: Take the medication to a medication take-back program. Ask your pharmacy or law enforcement to find a location. If you cannot return the medication, ask your pharmacist or care team how to get rid of the medication safely. NOTE: This sheet is a summary. It may not cover all possible information. If you have questions about this medicine, talk to your doctor, pharmacist, or health care provider.  2024 Elsevier/Gold Standard (2023-01-30 00:00:00)

## 2024-01-04 ENCOUNTER — Encounter: Payer: Self-pay | Admitting: Radiology

## 2024-01-26 ENCOUNTER — Inpatient Hospital Stay: Attending: Oncology

## 2024-01-26 ENCOUNTER — Inpatient Hospital Stay (HOSPITAL_BASED_OUTPATIENT_CLINIC_OR_DEPARTMENT_OTHER): Admitting: Oncology

## 2024-01-26 ENCOUNTER — Encounter: Payer: Self-pay | Admitting: Oncology

## 2024-01-26 ENCOUNTER — Inpatient Hospital Stay

## 2024-01-26 VITALS — BP 146/78 | HR 77 | Temp 97.3°F | Resp 17 | Ht 65.0 in | Wt 119.3 lb

## 2024-01-26 DIAGNOSIS — Z79899 Other long term (current) drug therapy: Secondary | ICD-10-CM | POA: Diagnosis not present

## 2024-01-26 DIAGNOSIS — E785 Hyperlipidemia, unspecified: Secondary | ICD-10-CM | POA: Insufficient documentation

## 2024-01-26 DIAGNOSIS — C7B8 Other secondary neuroendocrine tumors: Secondary | ICD-10-CM | POA: Insufficient documentation

## 2024-01-26 DIAGNOSIS — H409 Unspecified glaucoma: Secondary | ICD-10-CM | POA: Insufficient documentation

## 2024-01-26 DIAGNOSIS — N2889 Other specified disorders of kidney and ureter: Secondary | ICD-10-CM

## 2024-01-26 LAB — CBC WITH DIFFERENTIAL (CANCER CENTER ONLY)
Abs Immature Granulocytes: 0.02 K/uL (ref 0.00–0.07)
Basophils Absolute: 0.1 K/uL (ref 0.0–0.1)
Basophils Relative: 1 %
Eosinophils Absolute: 0.6 K/uL — ABNORMAL HIGH (ref 0.0–0.5)
Eosinophils Relative: 8 %
HCT: 32.1 % — ABNORMAL LOW (ref 36.0–46.0)
Hemoglobin: 10.4 g/dL — ABNORMAL LOW (ref 12.0–15.0)
Immature Granulocytes: 0 %
Lymphocytes Relative: 27 %
Lymphs Abs: 2.1 K/uL (ref 0.7–4.0)
MCH: 28.7 pg (ref 26.0–34.0)
MCHC: 32.4 g/dL (ref 30.0–36.0)
MCV: 88.7 fL (ref 80.0–100.0)
Monocytes Absolute: 0.6 K/uL (ref 0.1–1.0)
Monocytes Relative: 8 %
Neutro Abs: 4.3 K/uL (ref 1.7–7.7)
Neutrophils Relative %: 56 %
Platelet Count: 230 K/uL (ref 150–400)
RBC: 3.62 MIL/uL — ABNORMAL LOW (ref 3.87–5.11)
RDW: 13.7 % (ref 11.5–15.5)
WBC Count: 7.6 K/uL (ref 4.0–10.5)
nRBC: 0 % (ref 0.0–0.2)

## 2024-01-26 LAB — CMP (CANCER CENTER ONLY)
ALT: 12 U/L (ref 0–44)
AST: 24 U/L (ref 15–41)
Albumin: 4.3 g/dL (ref 3.5–5.0)
Alkaline Phosphatase: 93 U/L (ref 38–126)
Anion gap: 13 (ref 5–15)
BUN: 19 mg/dL (ref 8–23)
CO2: 24 mmol/L (ref 22–32)
Calcium: 9.8 mg/dL (ref 8.9–10.3)
Chloride: 103 mmol/L (ref 98–111)
Creatinine: 0.83 mg/dL (ref 0.44–1.00)
GFR, Estimated: >60 mL/min (ref >60)
Glucose, Bld: 97 mg/dL (ref 70–99)
Potassium: 4.1 mmol/L (ref 3.5–5.1)
Sodium: 139 mmol/L (ref 135–145)
Total Bilirubin: 0.4 mg/dL (ref 0.0–1.2)
Total Protein: 7.2 g/dL (ref 6.5–8.1)

## 2024-01-26 LAB — MAGNESIUM: Magnesium: 1.9 mg/dL (ref 1.7–2.4)

## 2024-01-26 MED ORDER — OCTREOTIDE ACETATE 20 MG IM KIT
20.0000 mg | PACK | Freq: Once | INTRAMUSCULAR | Status: AC
  Administered 2024-01-26: 20 mg via INTRAMUSCULAR

## 2024-01-26 NOTE — Assessment & Plan Note (Signed)
 Tumor located at the upper pole of the right kidney, measuring approximately 3.9 cm. Imaging suggests renal carcinoma, but further evaluation is required. Urology will assess the possibility of surgical resection versus ablation.  She does have an appointment coming up with their office next week.    The tumor is distinct from the neuroendocrine tumor and is suspected to be primary renal cancer. Surgical resection is preferred if feasible to obtain tissue for definitive diagnosis and to assess the grade of the tumor.   She had urology evaluation for the kidney mass.  She was not deemed to be a surgical candidate.  Cystoscopy showed no bladder tumor.  Surveillance versus IR evaluation for possible ablation was recommended.  Referral sent to IR Dr. Alona.  Intervention currently on hold

## 2024-01-26 NOTE — Patient Instructions (Signed)
 [facility] - A DEPT OF [facility]  Discharge Instructions: Thank you for choosing [facility] to provide your oncology and hematology care.   If you have a lab appointment with the Cancer Center, please go directly to the Cancer Center and check in at the registration area.   Wear comfortable clothing and clothing appropriate for easy access to any Portacath or PICC line.   We strive to give you quality time with your provider. You may need to reschedule your appointment if you arrive late (15 or more minutes).  Arriving late affects you and other patients whose appointments are after yours.  Also, if you miss three or more appointments without notifying the office, you may be dismissed from the clinic at the provider's discretion.      For prescription refill requests, have your pharmacy contact our office and allow 72 hours for refills to be completed.    Today you received the following chemotherapy and/or immunotherapy agents: sandostatin       To help prevent nausea and vomiting after your treatment, we encourage you to take your nausea medication as directed.  BELOW ARE SYMPTOMS THAT SHOULD BE REPORTED IMMEDIATELY: *FEVER GREATER THAN 100.4 F (38 C) OR HIGHER *CHILLS OR SWEATING *NAUSEA AND VOMITING THAT IS NOT CONTROLLED WITH YOUR NAUSEA MEDICATION *UNUSUAL SHORTNESS OF BREATH *UNUSUAL BRUISING OR BLEEDING *URINARY PROBLEMS (pain or burning when urinating, or frequent urination) *BOWEL PROBLEMS (unusual diarrhea, constipation, pain near the anus) TENDERNESS IN MOUTH AND THROAT WITH OR WITHOUT PRESENCE OF ULCERS (sore throat, sores in mouth, or a toothache) UNUSUAL RASH, SWELLING OR PAIN  UNUSUAL VAGINAL DISCHARGE OR ITCHING   Items with * indicate a potential emergency and should be followed up as soon as possible or go to the Emergency Department if any problems should occur.  Please show the CHEMOTHERAPY ALERT CARD or  IMMUNOTHERAPY ALERT CARD at check-in to the Emergency Department and triage nurse.  Should you have questions after your visit or need to cancel or reschedule your appointment, please contact [facility] - A DEPT OF [facility]  Dept: [phone]  and follow the prompts.  Office hours are 8:00 a.m. to 4:30 p.m. Monday - Friday. Please note that voicemails left after 4:00 p.m. may not be returned until the following business day.  We are closed weekends and major holidays. You have access to a nurse at all times for urgent questions. Please call the main number to the clinic Dept: [phone] and follow the prompts.   For any non-urgent questions, you may also contact your provider using MyChart. We now offer e-Visits for anyone 42 and older to request care online for non-urgent symptoms. For details visit [email or web address].   Also download the MyChart app! Go to the app store, search MyChart, open the app, select [facility], and log in with your MyChart username and password.

## 2024-01-26 NOTE — Progress Notes (Signed)
 [facility]  ONCOLOGY CLINIC PROGRESS NOTE   Patient Care Team: [name], MD as PCP - General (Internal Medicine) Jakie Alm SAUNDERS, MD as Consulting Physician (Gastroenterology) Oman, Heather, OD (Optometry) Livingston Rigg, MD as Consulting Physician (Dermatology)  PATIENT NAME: Hannah Madden   MR#: [id number] DOB: Nov 23, 1934  Date of visit: 01/26/2024   ASSESSMENT & PLAN:   TRISTINE LANGI is a 89 y.o.  lady with a past medical history of GERD, glaucoma, dyslipidemia, hiatal hernia, alopecia, was referred to our clinic for newly diagnosed neuroendocrine tumor with multiple liver mets.  Also has renal mass, concerning for separate renal primary.   Metastatic malignant neuroendocrine tumor to liver [facility]) Please review oncology history for additional details and timeline of events.    CT abdomen and pelvis on 06/20/2023 showed multiple large heterogeneous liver lesions, largest measuring 10.6 cm in the right hepatic lobe and 5.7 cm in the left hepatic lobe.  Also noted was heterogeneous and has a mass in the superior pole of the right kidney measuring 3.5 cm, concerning for renal cell carcinoma.  Mesenteric mass or lymphadenopathy in the left central mesentery measuring 3.9 cm.  CT chest showed no evidence of metastatic disease in the chest.  On 06/22/2023, she underwent ultrasound-guided biopsy of the liver lesion.  Pathology showed well-differentiated neuroendocrine tumor, grade 1, Ki-67 index of 2%.  The neoplastic cells were diffusely and strongly positive for the neuroendocrine markers chromogranin and synaptic ficin.  They were negative for cytokeratin 7 and cytokeratin 20.  The tumor exhibits focal patchy nonspecific positivity for PAX 8 which is of uncertain significance.    Previously I discussed diagnosis, prognosis, plan of care, treatment options.  Reviewed NCCN guidelines.  Stage IV disease because of extensive liver metastatic disease.  The tumor  is well-differentiated, low-grade, with a Ki-67 of 2%, indicating slow growth.  She does not have any carcinoid symptoms.  Despite its indolent nature, the significant hepatic tumor burden necessitates treatment to prevent liver function compromise. The primary site is suspected to be the gut lining but is not definitively identified. The tumor is not expected to significantly affect life expectancy. Treatment aims to control disease progression and prevent further spread, as a complete cure is not feasible due to extensive liver involvement.  On her initial consultation with us  on 06/30/2023, chromogranin A was elevated at 1495 ng/mL.  Request submitted for PET dotatate scan.    On 07/09/2023, PET dotatate scan showed bilobar tracer avid liver metastases, large lesion measuring 10.7 x 8.5 cm in the right lobe, 6.4 x 3.8 cm in the left lobe.  Multiple tracer avid lymph nodes identified within the small bowel mesentery, compatible with nodal metastasis.  Focal area of increased radiotracer uptake localized to the loop of small bowel within the left lower quadrant, likely small bowel primary.  No uptake noted in the right kidney lesion.   Patient did not have carcinoid symptoms.  However given the burden of disease, decision was made to proceed with octreotide  injections, which she started from 07/15/2023.  She tolerated the injections well.  Dose given today.  Plan to continue monthly.   She had urology evaluation for the kidney mass.  She was not deemed to be a surgical candidate.  Cystoscopy showed no bladder tumor.  Surveillance versus IR evaluation for possible ablation was recommended.  Referral sent to IR Dr. Alona and I also discussed her case with him previously over the phone.  Intervention is  currently on hold.  Restaging PET dotatate scan on 11/17/2023 showed overall stable disease with slight improvement in some of the lesions.  She does not have any major cognitive symptoms currently.  Plan is to  continue octreotide  injections monthly.  Proceed with octreotide  20 mg dose today.  RTC in 4 weeks for octreotide  injection only.  RTC in 8 weeks for labs, office visit and octreotide  injection  Right kidney mass Tumor located at the upper pole of the right kidney, measuring approximately 3.9 cm. Imaging suggests renal carcinoma, but further evaluation is required. Urology will assess the possibility of surgical resection versus ablation.  She does have an appointment coming up with their office next week.    The tumor is distinct from the neuroendocrine tumor and is suspected to be primary renal cancer. Surgical resection is preferred if feasible to obtain tissue for definitive diagnosis and to assess the grade of the tumor.   She had urology evaluation for the kidney mass.  She was not deemed to be a surgical candidate.  Cystoscopy showed no bladder tumor.  Surveillance versus IR evaluation for possible ablation was recommended.  Referral sent to IR Dr. Alona.  Intervention currently on hold  Mild anemia Present since last year with no significant changes. White blood cell and platelet counts are normal. No symptoms such as dizziness or lightheadedness reported.  Right ankle pruritic rash Pruritic rash on the right ankle, noticed less than a week ago. Skin appears dry, which may contribute to itchiness.  - Apply thick moisturizer and aloe vera gel to the affected area - Use Benadryl cream if needed for itching - If rash persists after 2-3 days, apply cortisone cream twice a day  I reviewed lab results and outside records for this visit and discussed relevant results with the patient. Diagnosis, plan of care and treatment options were also discussed in detail with the patient. Opportunity provided to ask questions and answers provided to her apparent satisfaction. Provided instructions to call our clinic with any problems, questions or concerns prior to return visit. I recommended to continue  follow-up with PCP and sub-specialists. She verbalized understanding and agreed with the plan.   NCCN guidelines have been consulted in the planning of this patient's care.  I spent a total of 30 minutes during this encounter with the patient including review of chart and various tests results, discussions about plan of care and coordination of care plan.   Chinita Patten, MD  01/26/2024 12:32 PM  [facility] - A DEPT OF [facility] [address] [place] [address] Dept: [phone] Dept Fax: [phone]    CHIEF COMPLAINT/ REASON FOR VISIT:   Well-differentiated neuroendocrine tumor, grade 1, with extensive liver metastatic disease, presumed to be small bowel primary.  Right kidney mass, likely separate primary.  Current Treatment: Monthly octreotide  injections started from 07/15/2023.  INTERVAL HISTORY:   Discussed the use of AI scribe software for clinical note transcription with the patient, who gave verbal consent to proceed.  History of Present Illness  WINOLA DRUM is an 89 year old female who presents for a routine follow-up and monthly injection.  She is receiving monthly injections without any issues. Her blood counts show mild anemia, which has been stable since last year, with normal white and platelet counts. Previous blood work showed good chemistry results, and a scan from September was performed.  She has a normal appetite, eating two meals a  day and preferring hot meals over sandwiches. No recent weight loss or decreased appetite.  She experienced one episode of illness in October but has otherwise been well. No loose stools, diarrhea, chest pain, trouble breathing, dizziness, lightheadedness, or wheezing.  She developed a rash on her right ankle less than a week ago, which is itchy.   I have reviewed the past medical history, past surgical history, social history and family  history with the patient and they are unchanged from previous note.  HISTORY OF PRESENT ILLNESS:   ONCOLOGY HISTORY:   Patient presented to the ED with complaints of worsening abdominal pain, back pain and near syncope on 06/19/2023 and was hospitalized for further evaluation.   CT abdomen and pelvis on 06/20/2023 showed multiple large heterogeneous liver lesions, largest measuring 10.6 cm in the right hepatic lobe and 5.7 cm in the left hepatic lobe.  Also noted was heterogeneous and has a mass in the superior pole of the right kidney measuring 3.5 cm, concerning for renal cell carcinoma.  Mesenteric mass or lymphadenopathy in the left central mesentery measuring 3.9 cm.  CT chest showed no evidence of metastatic disease in the chest.   Urology and interventional radiology was consulted.  Though she may have renal cell carcinoma, liver metastatic disease was not felt to be related as it is not common for renal cell carcinoma to metastasize to liver.  She may have a lesion in the bladder based on the CT imaging and hence outpatient cystoscopy and further workup was planned by urology.   On 06/22/2023, she underwent ultrasound-guided biopsy of the liver lesion.  Pathology showed well-differentiated neuroendocrine tumor, grade 1, Ki-67 index of 2%.  The neoplastic cells were diffusely and strongly positive for the neuroendocrine markers chromogranin and synaptic ficin.  They were negative for cytokeratin 7 and cytokeratin 20.  The tumor exhibits focal patchy nonspecific positivity for PAX 8 which is of uncertain significance.     On her initial consultation with us  on 06/30/2023, chromogranin A was elevated at 1495 ng/mL.  Request submitted for PET dotatate scan.    On 07/09/2023, PET dotatate scan showed bilobar tracer avid liver metastases, large lesion measuring 10.7 x 8.5 cm in the right lobe, 6.4 x 3.8 cm in the left lobe.  Multiple tracer avid lymph nodes identified within the small bowel mesentery,  compatible with nodal metastasis.  Focal area of increased radiotracer uptake localized to the loop of small bowel within the left lower quadrant, likely small bowel primary.  No uptake noted in the right kidney lesion.   Patient does not have carcinoid symptoms.  However given the burden of disease, decision was made to proceed with octreotide  injections, which she started from 07/15/2023.  Plan to continue monthly.   She had urology evaluation for the kidney mass.  She was not deemed to be a surgical candidate.  Cystoscopy showed no bladder tumor.  Surveillance versus IR evaluation for possible ablation was recommended.  Referral sent to IR Dr. Alona.  Restaging PET dotatate scan on 11/17/2023 showed overall stable disease with slight improvement in some of the lesions.  Plan to continue octreotide  injections monthly at 20 mg dose.   REVIEW OF SYSTEMS:   Review of Systems - Oncology  All other pertinent systems were reviewed with the patient and are negative.  ALLERGIES: She is allergic to atorvastatin, macrobid [nitrofurantoin], penicillins, and ciprofloxacin .  MEDICATIONS:  Current Outpatient Medications  Medication Sig Dispense Refill   acetaminophen  (TYLENOL ) 500 MG tablet  Take 500 mg by mouth every 6 (six) hours as needed for moderate pain (pain score 4-6).     brimonidine -timolol  (COMBIGAN ) 0.2-0.5 % ophthalmic solution Place 1 drop into the right eye 2 (two) times daily.     brinzolamide  (AZOPT ) 1 % ophthalmic suspension Place 1 drop into the right eye 2 (two) times daily.     cyanocobalamin  (VITAMIN B12) 1000 MCG tablet Take 1 tablet (1,000 mcg total) by mouth daily. 30 tablet 0   ondansetron  (ZOFRAN ) 4 MG tablet Take 1 tablet (4 mg total) by mouth every 8 (eight) hours as needed for nausea or vomiting. 90 tablet 3   pantoprazole  (PROTONIX ) 40 MG tablet Take 1 tablet (40 mg total) by mouth daily. 90 tablet 3   tobramycin  (TOBREX ) 0.3 % ophthalmic solution Place 1 drop into the  right eye as needed (before eye injection).     triamcinolone  cream (KENALOG ) 0.1 % Apply 1 Application topically 2 (two) times daily. 30 g 1   No current facility-administered medications for this visit.     VITALS:   Blood pressure (!) 146/78, pulse 77, temperature (!) 97.3 F (36.3 C), temperature source Temporal, resp. rate 17, height 5' 5 (1.651 m), weight 119 lb 4.8 oz (54.1 kg).  Wt Readings from Last 3 Encounters:  01/26/24 119 lb 4.8 oz (54.1 kg)  12/01/23 116 lb 3.2 oz (52.7 kg)  11/10/23 118 lb (53.5 kg)    Body mass index is 19.85 kg/m.     Onc Performance Status - 01/26/24 1151       ECOG Perf Status   ECOG Perf Status Ambulatory and capable of all selfcare but unable to carry out any work activities.  Up and about more than 50% of waking hours      KPS SCALE   KPS % SCORE Able to carry on normal activity, minor s/s of disease            PHYSICAL EXAM:   Physical Exam Constitutional:      General: She is not in acute distress.    Appearance: Normal appearance.  HENT:     Head: Normocephalic and atraumatic.  Cardiovascular:     Rate and Rhythm: Normal rate.  Pulmonary:     Effort: Pulmonary effort is normal. No respiratory distress.  Abdominal:     General: There is no distension.  Neurological:     General: No focal deficit present.     Mental Status: She is alert and oriented to person, place, and time.  Psychiatric:        Mood and Affect: Mood normal.        Behavior: Behavior normal.      LABORATORY DATA:   I have reviewed the data as listed.  Results for orders placed or performed in visit on 01/26/24  Magnesium   Result Value Ref Range   Magnesium  1.9 1.7 - 2.4 mg/dL  CMP (Cancer Center only)  Result Value Ref Range   Sodium 139 135 - 145 mmol/L   Potassium 4.1 3.5 - 5.1 mmol/L   Chloride 103 98 - 111 mmol/L   CO2 24 22 - 32 mmol/L   Glucose, Bld 97 70 - 99 mg/dL   BUN 19 8 - 23 mg/dL   Creatinine 9.16 9.55 - 1.00 mg/dL    Calcium  9.8 8.9 - 10.3 mg/dL   Total Protein 7.2 6.5 - 8.1 g/dL   Albumin 4.3 3.5 - 5.0 g/dL   AST 24 15 - 41 U/L  ALT 12 0 - 44 U/L   Alkaline Phosphatase 93 38 - 126 U/L   Total Bilirubin 0.4 0.0 - 1.2 mg/dL   GFR, Estimated >39 >39 mL/min   Anion gap 13 5 - 15  CBC with Differential (Cancer Center Only)  Result Value Ref Range   WBC Count 7.6 4.0 - 10.5 K/uL   RBC 3.62 (L) 3.87 - 5.11 MIL/uL   Hemoglobin 10.4 (L) 12.0 - 15.0 g/dL   HCT 67.8 (L) 63.9 - 53.9 %   MCV 88.7 80.0 - 100.0 fL   MCH 28.7 26.0 - 34.0 pg   MCHC 32.4 30.0 - 36.0 g/dL   RDW 86.2 88.4 - 84.4 %   Platelet Count 230 150 - 400 K/uL   nRBC 0.0 0.0 - 0.2 %   Neutrophils Relative % 56 %   Neutro Abs 4.3 1.7 - 7.7 K/uL   Lymphocytes Relative 27 %   Lymphs Abs 2.1 0.7 - 4.0 K/uL   Monocytes Relative 8 %   Monocytes Absolute 0.6 0.1 - 1.0 K/uL   Eosinophils Relative 8 %   Eosinophils Absolute 0.6 (H) 0.0 - 0.5 K/uL   Basophils Relative 1 %   Basophils Absolute 0.1 0.0 - 0.1 K/uL   Immature Granulocytes 0 %   Abs Immature Granulocytes 0.02 0.00 - 0.07 K/uL      RADIOGRAPHIC STUDIES:  NM PET DOTATATE SKULL BASE TO MID THIGH CLINICAL DATA:  Metastatic well differentiated neuroendocrine tumor.  EXAM: NUCLEAR MEDICINE PET SKULL BASE TO THIGH  TECHNIQUE: 3.9 mCi copper  64 DOTATATE was injected intravenously. Full-ring PET imaging was performed from the skull base to thigh after the radiotracer. CT data was obtained and used for attenuation correction and anatomic localization.  COMPARISON:  DOTATATE PET scan 07/09/2023  FINDINGS: NECK  No radiotracer activity in neck lymph nodes.  Incidental CT findings: None  CHEST  No radiotracer accumulation within mediastinal or hilar lymph nodes. No suspicious pulmonary nodules on the CT scan.  Incidental CT finding:Coronary artery calcification and aortic atherosclerotic calcification.  ABDOMEN/PELVIS  Again demonstrated multiple large intense  radiotracer avid liver metastasis. Lesions not changed in number intensity compared to prior. For example dominant lesion in the RIGHT hepatic lobe measuring 12 cm with SUV max 29 compares to SUV max equal 2.6. Significant change in size. Intensely radiotracer avid lesion in the lateral aspect the LEFT hepatic lobe measures 5.6 cm compared to 5.9 cm with SUV max equal 30.6 compared SUV max equal 29 2. No new lesions are present  Within the LEFT upper quadrant mesenteric mass measuring 3.4 cm with SUV max equal 36 is unchanged (image 118) no new mesenteric lesions are identified. No evidence of bowel obstruction.  Physiologic activity noted in the liver, spleen, adrenal glands and kidneys.  Incidental CT findings:Bladder is moderately distended. Post hysterectomy. Atherosclerotic calcification of the aorta.  SKELETON  No focal activity to suggest skeletal metastasis.  Incidental CT findings:None  IMPRESSION: 1. No change in size or radiotracer activity of multiple large liver metastasis. 2. No change in size or radiotracer activity of LEFT upper quadrant mesenteric mass. 3. No new sites of metastatic disease. 4. No evidence of bowel obstruction. 5.  Aortic Atherosclerosis (ICD10-I70.0).  Electronically Signed   By: Jackquline Boxer M.D.   On: 11/17/2023 16:52     CODE STATUS:  Code Status History     Date Active Date Inactive Code Status Order ID Comments User Context   06/20/2023 0855 06/23/2023 1921 Full Code  [id number]  Celinda Alm Lot, MD Inpatient   06/20/2023 0855 06/20/2023 0855 Full Code [id number]  Celinda Alm Lot, MD Inpatient   09/12/2013 1422 09/13/2013 1539 Full Code [id number]  Addie Cordella Hamilton, MD Inpatient    Questions for Most Recent Historical Code Status (Order [id number])     Question Answer   By: Consent: discussion documented in EHR            Orders Placed This Encounter  Procedures   CBC with Differential (Cancer Center Only)     Standing Status:   Future    Expected Date:   03/27/2024    Expiration Date:   06/25/2024   CMP (Cancer Center only)    Standing Status:   Future    Expected Date:   03/27/2024    Expiration Date:   06/25/2024   Chromogranin A    Standing Status:   Future    Expected Date:   03/27/2024    Expiration Date:   06/25/2024    This document was completed utilizing speech recognition software. Grammatical errors, random word insertions, pronoun errors, and incomplete sentences are an occasional consequence of this system due to software limitations, ambient noise, and hardware issues. Any formal questions or concerns about the content, text or information contained within the body of this dictation should be directly addressed to the provider for clarification.

## 2024-01-26 NOTE — Assessment & Plan Note (Addendum)
 Please review oncology history for additional details and timeline of events.    CT abdomen and pelvis on 06/20/2023 showed multiple large heterogeneous liver lesions, largest measuring 10.6 cm in the right hepatic lobe and 5.7 cm in the left hepatic lobe.  Also noted was heterogeneous and has a mass in the superior pole of the right kidney measuring 3.5 cm, concerning for renal cell carcinoma.  Mesenteric mass or lymphadenopathy in the left central mesentery measuring 3.9 cm.  CT chest showed no evidence of metastatic disease in the chest.  On 06/22/2023, she underwent ultrasound-guided biopsy of the liver lesion.  Pathology showed well-differentiated neuroendocrine tumor, grade 1, Ki-67 index of 2%.  The neoplastic cells were diffusely and strongly positive for the neuroendocrine markers chromogranin and synaptic ficin.  They were negative for cytokeratin 7 and cytokeratin 20.  The tumor exhibits focal patchy nonspecific positivity for PAX 8 which is of uncertain significance.    Previously I discussed diagnosis, prognosis, plan of care, treatment options.  Reviewed NCCN guidelines.  Stage IV disease because of extensive liver metastatic disease.  The tumor is well-differentiated, low-grade, with a Ki-67 of 2%, indicating slow growth.  She does not have any carcinoid symptoms.  Despite its indolent nature, the significant hepatic tumor burden necessitates treatment to prevent liver function compromise. The primary site is suspected to be the gut lining but is not definitively identified. The tumor is not expected to significantly affect life expectancy. Treatment aims to control disease progression and prevent further spread, as a complete cure is not feasible due to extensive liver involvement.  On her initial consultation with us  on 06/30/2023, chromogranin A was elevated at 1495 ng/mL.  Request submitted for PET dotatate scan.    On 07/09/2023, PET dotatate scan showed bilobar tracer avid liver  metastases, large lesion measuring 10.7 x 8.5 cm in the right lobe, 6.4 x 3.8 cm in the left lobe.  Multiple tracer avid lymph nodes identified within the small bowel mesentery, compatible with nodal metastasis.  Focal area of increased radiotracer uptake localized to the loop of small bowel within the left lower quadrant, likely small bowel primary.  No uptake noted in the right kidney lesion.   Patient did not have carcinoid symptoms.  However given the burden of disease, decision was made to proceed with octreotide  injections, which she started from 07/15/2023.  She tolerated the injections well.  Dose given today.  Plan to continue monthly.   She had urology evaluation for the kidney mass.  She was not deemed to be a surgical candidate.  Cystoscopy showed no bladder tumor.  Surveillance versus IR evaluation for possible ablation was recommended.  Referral sent to IR Dr. Alona and I also discussed her case with him previously over the phone.  Intervention is currently on hold.  Restaging PET dotatate scan on 11/17/2023 showed overall stable disease with slight improvement in some of the lesions.  She does not have any major cognitive symptoms currently.  Plan is to continue octreotide  injections monthly.  Proceed with octreotide  20 mg dose today.  RTC in 4 weeks for octreotide  injection only.  RTC in 8 weeks for labs, office visit and octreotide  injection

## 2024-01-27 LAB — CHROMOGRANIN A: Chromogranin A (ng/mL): 1632 ng/mL — ABNORMAL HIGH (ref 0.0–101.8)

## 2024-02-05 DIAGNOSIS — R0981 Nasal congestion: Secondary | ICD-10-CM | POA: Diagnosis not present

## 2024-02-05 DIAGNOSIS — R42 Dizziness and giddiness: Secondary | ICD-10-CM | POA: Diagnosis not present

## 2024-02-05 DIAGNOSIS — R55 Syncope and collapse: Secondary | ICD-10-CM | POA: Diagnosis not present

## 2024-02-05 DIAGNOSIS — U071 COVID-19: Secondary | ICD-10-CM | POA: Diagnosis not present

## 2024-02-23 ENCOUNTER — Encounter: Payer: Self-pay | Admitting: Oncology

## 2024-03-01 ENCOUNTER — Inpatient Hospital Stay: Attending: Oncology

## 2024-03-01 VITALS — BP 162/75 | HR 79 | Temp 98.0°F | Resp 18

## 2024-03-01 DIAGNOSIS — Z79899 Other long term (current) drug therapy: Secondary | ICD-10-CM | POA: Diagnosis not present

## 2024-03-01 DIAGNOSIS — C7B8 Other secondary neuroendocrine tumors: Secondary | ICD-10-CM | POA: Diagnosis present

## 2024-03-01 MED ORDER — OCTREOTIDE ACETATE 20 MG IM KIT
20.0000 mg | PACK | Freq: Once | INTRAMUSCULAR | Status: AC
  Administered 2024-03-01: 20 mg via INTRAMUSCULAR
  Filled 2024-03-01: qty 1

## 2024-03-01 NOTE — Patient Instructions (Signed)
 Octreotide Injection Suspension What is this medication? OCTREOTIDE (ok TREE oh tide) treats high levels of growth hormone (acromegaly). It works by reducing the amount of growth hormone your body makes. This reduces symptoms and the risk of health problems caused by too much growth hormone, such as diabetes and heart disease. It may also be used to treat diarrhea caused by neuroendocrine tumors. It works by slowing down the release of serotonin from the tumor cells. This reduces the number of bowel movements you have. This medicine may be used for other purposes; ask your health care provider or pharmacist if you have questions. COMMON BRAND NAME(S): Sandostatin LAR What should I tell my care team before I take this medication? They need to know if you have any of these conditions: Diabetes Gallbladder disease Heart disease Kidney disease Liver disease Pancreatic disease Thyroid disease An unusual or allergic reaction to octreotide, other medications, foods, dyes, or preservatives Pregnant or trying to get pregnant Breastfeeding How should I use this medication? This medication is injected into a muscle. It is usually given by your care team in a hospital or clinic setting. Talk to your care team about the use of this medication in children. Special care may be needed. Overdosage: If you think you have taken too much of this medicine contact a poison control center or emergency room at once. NOTE: This medicine is only for you. Do not share this medicine with others. What if I miss a dose? Keep appointments for follow-up doses. It is important not to miss your dose. Call your care team if you are unable to keep an appointment. What may interact with this medication? Do not take this medication with any of the following: Cisapride Dronedarone Flibanserin Lutetium Lu 177 dotatate Pimozide Saquinavir Thioridazine This medication may also interact with the  following: Bromocriptine Certain medications for blood pressure, heart disease, irregular heartbeat Cyclosporine Diuretics Medications for diabetes, including insulin Quinidine This list may not describe all possible interactions. Give your health care provider a list of all the medicines, herbs, non-prescription drugs, or dietary supplements you use. Also tell them if you smoke, drink alcohol, or use illegal drugs. Some items may interact with your medicine. What should I watch for while using this medication? Visit your care team for regular checks on your progress. Tell your care team if your symptoms do not start to get better or if they get worse. This medication may cause decreases in blood sugar. Signs of low blood sugar include chills, cool, pale skin or cold sweats, drowsiness, extreme hunger, fast heartbeat, headache, nausea, nervousness or anxiety, shakiness, trembling, unsteadiness, tiredness, or weakness. Contact your care team right away if you experience any of these symptoms. This medication may increase blood sugar. The risk may be higher in patients who already have diabetes. Ask your care team what you can do to lower your risk of diabetes while taking this medication. You should make sure you get enough vitamin B12 while you are taking this medication. Discuss the foods you eat and the vitamins you take with your care team. What side effects may I notice from receiving this medication? Side effects that you should report to your care team as soon as possible: Allergic reactions--skin rash, itching, hives, swelling of the face, lips, tongue, or throat Gallbladder problems--severe stomach pain, nausea, vomiting, fever Heart rhythm changes--fast or irregular heartbeat, dizziness, feeling faint or lightheaded, chest pain, trouble breathing High blood sugar (hyperglycemia)--increased thirst or amount of urine, unusual weakness or fatigue,  blurry vision Low blood sugar  (hypoglycemia)--tremors or shaking, anxiety, sweating, cold or clammy skin, confusion, dizziness, rapid heartbeat Low thyroid levels (hypothyroidism)--unusual weakness or fatigue, increased sensitivity to cold, constipation, hair loss, dry skin, weight gain, feelings of depression Low vitamin B12 level--pain, tingling, or numbness in the hands or feet, muscle weakness, dizziness, confusion, trouble concentrating Oily or light-colored stools, diarrhea, bloating, weight loss Pancreatitis--severe stomach pain that spreads to your back or gets worse after eating or when touched, fever, nausea, vomiting Slow heartbeat--dizziness, feeling faint or lightheaded, confusion, trouble breathing, unusual weakness or fatigue Side effects that usually do not require medical attention (report these to your care team if they continue or are bothersome): Diarrhea Dizziness Headache Nausea Pain, redness, or irritation at injection site Stomach pain This list may not describe all possible side effects. Call your doctor for medical advice about side effects. You may report side effects to FDA at 1-800-FDA-1088. Where should I keep my medication? This medication is given in a hospital or clinic. It will not be stored at home. NOTE: This sheet is a summary. It may not cover all possible information. If you have questions about this medicine, talk to your doctor, pharmacist, or health care provider.  2024 [employer] Standard (2023-01-30 00:00:00)

## 2024-03-21 ENCOUNTER — Inpatient Hospital Stay: Attending: Oncology

## 2024-03-21 ENCOUNTER — Inpatient Hospital Stay

## 2024-03-21 ENCOUNTER — Encounter: Payer: Self-pay | Admitting: Oncology

## 2024-03-21 ENCOUNTER — Inpatient Hospital Stay (HOSPITAL_BASED_OUTPATIENT_CLINIC_OR_DEPARTMENT_OTHER): Admitting: Oncology

## 2024-03-21 VITALS — BP 138/78 | HR 91 | Temp 97.4°F | Resp 16 | Wt 120.3 lb

## 2024-03-21 DIAGNOSIS — D649 Anemia, unspecified: Secondary | ICD-10-CM | POA: Insufficient documentation

## 2024-03-21 DIAGNOSIS — C7B8 Other secondary neuroendocrine tumors: Secondary | ICD-10-CM | POA: Insufficient documentation

## 2024-03-21 DIAGNOSIS — Z79899 Other long term (current) drug therapy: Secondary | ICD-10-CM | POA: Insufficient documentation

## 2024-03-21 DIAGNOSIS — K7689 Other specified diseases of liver: Secondary | ICD-10-CM | POA: Insufficient documentation

## 2024-03-21 LAB — CBC WITH DIFFERENTIAL (CANCER CENTER ONLY)
Abs Immature Granulocytes: 0.03 K/uL (ref 0.00–0.07)
Basophils Absolute: 0.1 K/uL (ref 0.0–0.1)
Basophils Relative: 1 %
Eosinophils Absolute: 1 K/uL — ABNORMAL HIGH (ref 0.0–0.5)
Eosinophils Relative: 11 %
HCT: 33.3 % — ABNORMAL LOW (ref 36.0–46.0)
Hemoglobin: 10.6 g/dL — ABNORMAL LOW (ref 12.0–15.0)
Immature Granulocytes: 0 %
Lymphocytes Relative: 27 %
Lymphs Abs: 2.4 K/uL (ref 0.7–4.0)
MCH: 28.6 pg (ref 26.0–34.0)
MCHC: 31.8 g/dL (ref 30.0–36.0)
MCV: 90 fL (ref 80.0–100.0)
Monocytes Absolute: 0.6 K/uL (ref 0.1–1.0)
Monocytes Relative: 7 %
Neutro Abs: 4.9 K/uL (ref 1.7–7.7)
Neutrophils Relative %: 54 %
Platelet Count: 240 K/uL (ref 150–400)
RBC: 3.7 MIL/uL — ABNORMAL LOW (ref 3.87–5.11)
RDW: 14.5 % (ref 11.5–15.5)
WBC Count: 8.9 K/uL (ref 4.0–10.5)
nRBC: 0 % (ref 0.0–0.2)

## 2024-03-21 LAB — CMP (CANCER CENTER ONLY)
ALT: 8 U/L (ref 0–44)
AST: 21 U/L (ref 15–41)
Albumin: 4.6 g/dL (ref 3.5–5.0)
Alkaline Phosphatase: 104 U/L (ref 38–126)
Anion gap: 13 (ref 5–15)
BUN: 15 mg/dL (ref 8–23)
CO2: 24 mmol/L (ref 22–32)
Calcium: 10.2 mg/dL (ref 8.9–10.3)
Chloride: 104 mmol/L (ref 98–111)
Creatinine: 0.92 mg/dL (ref 0.44–1.00)
GFR, Estimated: >60 mL/min
Glucose, Bld: 100 mg/dL — ABNORMAL HIGH (ref 70–99)
Potassium: 4.5 mmol/L (ref 3.5–5.1)
Sodium: 140 mmol/L (ref 135–145)
Total Bilirubin: 0.4 mg/dL (ref 0.0–1.2)
Total Protein: 7.5 g/dL (ref 6.5–8.1)

## 2024-03-21 LAB — MAGNESIUM: Magnesium: 1.9 mg/dL (ref 1.7–2.4)

## 2024-03-21 MED ORDER — OCTREOTIDE ACETATE 20 MG IM KIT
20.0000 mg | PACK | Freq: Once | INTRAMUSCULAR | Status: AC
  Administered 2024-03-21: 20 mg via INTRAMUSCULAR
  Filled 2024-03-21: qty 1

## 2024-03-21 NOTE — Patient Instructions (Signed)
 Octreotide Injection Solution What is this medication? OCTREOTIDE (ok TREE oh tide) treats high levels of growth hormone (acromegaly). It works by reducing the amount of growth hormone your body makes. This reduces symptoms and the risk of health problems caused by too much growth hormone, such as diabetes and heart disease. It may also be used to treat diarrhea caused by neuroendocrine tumors. It works by slowing down the release of serotonin from the tumor cells. This reduces the number of bowel movements you have. This medicine may be used for other purposes; ask your health care provider or pharmacist if you have questions. COMMON BRAND NAME(S): Berline Lopes, Sandostatin What should I tell my care team before I take this medication? They need to know if you have any of these conditions: Diabetes Gallbladder disease Heart disease Kidney disease Liver disease Pancreatic disease Thyroid disease An unusual or allergic reaction to octreotide, other medications, foods, dyes, or preservatives Pregnant or trying to get pregnant Breastfeeding How should I use this medication? This medication is injected under the skin or into a vein. It is usually given by your care team in a hospital or clinic setting. If you get this medication at home, you will be taught how to prepare and give it. Use exactly as directed. Take it as directed on the prescription label at the same time every day. Keep taking it unless your care team tells you to stop. Allow the injection solution to come to room temperature before use. Do not warm it artificially. It is important that you put your used needles and syringes in a special sharps container. Do not put them in a trash can. If you do not have a sharps container, call your pharmacist or care team to get one. Talk to your care team about the use of this medication in children. Special care may be needed. Overdosage: If you think you have taken too much of this medicine  contact a poison control center or emergency room at once. NOTE: This medicine is only for you. Do not share this medicine with others. What if I miss a dose? If you miss a dose, take it as soon as you can. If it is almost time for your next dose, take only that dose. Do not take double or extra doses. What may interact with this medication? Bromocriptine Certain medications for blood pressure, heart disease, irregular heartbeat Cyclosporine Diuretics Medications for diabetes, including insulin Quinidine This list may not describe all possible interactions. Give your health care provider a list of all the medicines, herbs, non-prescription drugs, or dietary supplements you use. Also tell them if you smoke, drink alcohol, or use illegal drugs. Some items may interact with your medicine. What should I watch for while using this medication? Visit your care team for regular checks on your progress. Tell your care team if your symptoms do not start to get better or if they get worse. To help reduce irritation at the injection site, use a different site for each injection and make sure the solution is at room temperature before use. This medication may cause decreases in blood sugar. Signs of low blood sugar include chills, cool, pale skin or cold sweats, drowsiness, extreme hunger, fast heartbeat, headache, nausea, nervousness or anxiety, shakiness, trembling, unsteadiness, tiredness, or weakness. Contact your care team right away if you experience any of these symptoms. This medication may increase blood sugar. The risk may be higher in patients who already have diabetes. Ask your care team what you can  do to lower your risk of diabetes while taking this medication. You should make sure you get enough vitamin B12 while you are taking this medication. Discuss the foods you eat and the vitamins you take with your care team. What side effects may I notice from receiving this medication? Side effects that  you should report to your care team as soon as possible: Allergic reactions--skin rash, itching, hives, swelling of the face, lips, tongue, or throat Gallbladder problems--severe stomach pain, nausea, vomiting, fever Heart rhythm changes--fast or irregular heartbeat, dizziness, feeling faint or lightheaded, chest pain, trouble breathing High blood sugar (hyperglycemia)--increased thirst or amount of urine, unusual weakness or fatigue, blurry vision Low blood sugar (hypoglycemia)--tremors or shaking, anxiety, sweating, cold or clammy skin, confusion, dizziness, rapid heartbeat Low thyroid levels (hypothyroidism)--unusual weakness or fatigue, increased sensitivity to cold, constipation, hair loss, dry skin, weight gain, feelings of depression Low vitamin B12 level--pain, tingling, or numbness in the hands or feet, muscle weakness, dizziness, confusion, trouble concentrating Oily or light-colored stools, diarrhea, bloating, weight loss Pancreatitis--severe stomach pain that spreads to your back or gets worse after eating or when touched, fever, nausea, vomiting Slow heartbeat--dizziness, feeling faint or lightheaded, confusion, trouble breathing, unusual weakness or fatigue Side effects that usually do not require medical attention (report these to your care team if they continue or are bothersome): Diarrhea Dizziness Headache Nausea Pain, redness, or irritation at injection site Stomach pain This list may not describe all possible side effects. Call your doctor for medical advice about side effects. You may report side effects to FDA at 1-800-FDA-1088. Where should I keep my medication? Keep out of the reach of children and pets. Store in the refrigerator. Protect from light. Allow to come to room temperature naturally. Do not use artificial heat. If protected from light, the injection may be stored between 20 and 30 degrees C (70 and 86 degrees F) for 14 days. After the initial use, throw away  any unused portion of a multiple dose vial after 14 days. Get rid of any unused portions of the ampules after use. To get rid of medications that are no longer needed or have expired: Take the medication to a medication take-back program. Ask your pharmacy or law enforcement to find a location. If you cannot return the medication, ask your pharmacist or care team how to get rid of the medication safely. NOTE: This sheet is a summary. It may not cover all possible information. If you have questions about this medicine, talk to your doctor, pharmacist, or health care provider.  2024 Elsevier/Gold Standard (2023-01-30 00:00:00)

## 2024-03-21 NOTE — Assessment & Plan Note (Signed)
 Please review oncology history for additional details and timeline of events.    CT abdomen and pelvis on 06/20/2023 showed multiple large heterogeneous liver lesions, largest measuring 10.6 cm in the right hepatic lobe and 5.7 cm in the left hepatic lobe.  Also noted was heterogeneous and has a mass in the superior pole of the right kidney measuring 3.5 cm, concerning for renal cell carcinoma.  Mesenteric mass or lymphadenopathy in the left central mesentery measuring 3.9 cm.  CT chest showed no evidence of metastatic disease in the chest.  On 06/22/2023, she underwent ultrasound-guided biopsy of the liver lesion.  Pathology showed well-differentiated neuroendocrine tumor, grade 1, Ki-67 index of 2%.  The neoplastic cells were diffusely and strongly positive for the neuroendocrine markers chromogranin and synaptic ficin.  They were negative for cytokeratin 7 and cytokeratin 20.  The tumor exhibits focal patchy nonspecific positivity for PAX 8 which is of uncertain significance.    Previously I discussed diagnosis, prognosis, plan of care, treatment options.  Reviewed NCCN guidelines.  Stage IV disease because of extensive liver metastatic disease.  The tumor is well-differentiated, low-grade, with a Ki-67 of 2%, indicating slow growth.  She does not have any carcinoid symptoms.  Despite its indolent nature, the significant hepatic tumor burden necessitates treatment to prevent liver function compromise. The primary site is suspected to be the gut lining but is not definitively identified. The tumor is not expected to significantly affect life expectancy. Treatment aims to control disease progression and prevent further spread, as a complete cure is not feasible due to extensive liver involvement.  On her initial consultation with us  on 06/30/2023, chromogranin A was elevated at 1495 ng/mL.  Request submitted for PET dotatate scan.    On 07/09/2023, PET dotatate scan showed bilobar tracer avid liver  metastases, large lesion measuring 10.7 x 8.5 cm in the right lobe, 6.4 x 3.8 cm in the left lobe.  Multiple tracer avid lymph nodes identified within the small bowel mesentery, compatible with nodal metastasis.  Focal area of increased radiotracer uptake localized to the loop of small bowel within the left lower quadrant, likely small bowel primary.  No uptake noted in the right kidney lesion.   Patient did not have carcinoid symptoms.  However given the burden of disease, decision was made to proceed with octreotide  injections, which she started from 07/15/2023.  She tolerated the injections well.  Dose given today.  Plan to continue monthly.   She had urology evaluation for the kidney mass.  She was not deemed to be a surgical candidate.  Cystoscopy showed no bladder tumor.  Surveillance versus IR evaluation for possible ablation was recommended.  Referral sent to IR Dr. Alona and I also discussed her case with him previously over the phone.  Intervention is currently on hold.  Restaging PET dotatate scan on 11/17/2023 showed overall stable disease with slight improvement in some of the lesions.  She does not have any major cognitive symptoms currently.  Plan is to continue octreotide  injections monthly.  Proceed with octreotide  20 mg dose today.  RTC every 4 weeks for octreotide  injection only.  RTC in 12 weeks for labs, office visit and octreotide  injection.  Will obtain restaging PET dotatate scan prior to return visit.  Request placed today.

## 2024-03-21 NOTE — Progress Notes (Signed)
 "  [facility]  ONCOLOGY CLINIC PROGRESS NOTE   Patient Care Team: [name], MD as PCP - General (Internal Medicine) Jakie Alm SAUNDERS, MD as Consulting Physician (Gastroenterology) Oman, Heather, OD (Optometry) Livingston Rigg, MD as Consulting Physician (Dermatology)  PATIENT NAME: Hannah Madden   MR#: [id number] DOB: [birth year]  Date of visit: 03/21/2024   ASSESSMENT & PLAN:   Hannah Madden is a 89 y.o.  lady with a past medical history of GERD, glaucoma, dyslipidemia, hiatal hernia, alopecia, was referred to our clinic for newly diagnosed neuroendocrine tumor with multiple liver mets.  Also has renal mass, concerning for separate renal primary.   Metastatic malignant neuroendocrine tumor to liver [facility]) Please review oncology history for additional details and timeline of events.    CT abdomen and pelvis on 06/20/2023 showed multiple large heterogeneous liver lesions, largest measuring 10.6 cm in the right hepatic lobe and 5.7 cm in the left hepatic lobe.  Also noted was heterogeneous and has a mass in the superior pole of the right kidney measuring 3.5 cm, concerning for renal cell carcinoma.  Mesenteric mass or lymphadenopathy in the left central mesentery measuring 3.9 cm.  CT chest showed no evidence of metastatic disease in the chest.  On 06/22/2023, she underwent ultrasound-guided biopsy of the liver lesion.  Pathology showed well-differentiated neuroendocrine tumor, grade 1, Ki-67 index of 2%.  The neoplastic cells were diffusely and strongly positive for the neuroendocrine markers chromogranin and synaptic ficin.  They were negative for cytokeratin 7 and cytokeratin 20.  The tumor exhibits focal patchy nonspecific positivity for PAX 8 which is of uncertain significance.    Previously I discussed diagnosis, prognosis, plan of care, treatment options.  Reviewed NCCN guidelines.  Stage IV disease because of extensive liver metastatic disease.  The tumor is  well-differentiated, low-grade, with a Ki-67 of 2%, indicating slow growth.  She does not have any carcinoid symptoms.  Despite its indolent nature, the significant hepatic tumor burden necessitates treatment to prevent liver function compromise. The primary site is suspected to be the gut lining but is not definitively identified. The tumor is not expected to significantly affect life expectancy. Treatment aims to control disease progression and prevent further spread, as a complete cure is not feasible due to extensive liver involvement.  On her initial consultation with us  on 06/30/2023, chromogranin A was elevated at 1495 ng/mL.  Request submitted for PET dotatate scan.    On 07/09/2023, PET dotatate scan showed bilobar tracer avid liver metastases, large lesion measuring 10.7 x 8.5 cm in the right lobe, 6.4 x 3.8 cm in the left lobe.  Multiple tracer avid lymph nodes identified within the small bowel mesentery, compatible with nodal metastasis.  Focal area of increased radiotracer uptake localized to the loop of small bowel within the left lower quadrant, likely small bowel primary.  No uptake noted in the right kidney lesion.   Patient did not have carcinoid symptoms.  However given the burden of disease, decision was made to proceed with octreotide  injections, which she started from 07/15/2023.  She tolerated the injections well.  Dose given today.  Plan to continue monthly.   She had urology evaluation for the kidney mass.  She was not deemed to be a surgical candidate.  Cystoscopy showed no bladder tumor.  Surveillance versus IR evaluation for possible ablation was recommended.  Referral sent to IR Dr. Alona and I also discussed her case with him previously over the phone.  Intervention  is currently on hold.  Restaging PET dotatate scan on 11/17/2023 showed overall stable disease with slight improvement in some of the lesions.  She does not have any major cognitive symptoms currently.  Plan is to  continue octreotide  injections monthly.  Proceed with octreotide  20 mg dose today.  RTC every 4 weeks for octreotide  injection only.  RTC in 12 weeks for labs, office visit and octreotide  injection.  Will obtain restaging PET dotatate scan prior to return visit.  Request placed today.  Pedal edema - Advised salt intake moderation, leg elevation, and use of compression socks for management of dependent edema.  Mild anemia Present since last year with no significant changes. White blood cell and platelet counts are normal. No symptoms such as dizziness or lightheadedness reported.  I reviewed lab results and outside records for this visit and discussed relevant results with the patient. Diagnosis, plan of care and treatment options were also discussed in detail with the patient. Opportunity provided to ask questions and answers provided to her apparent satisfaction. Provided instructions to call our clinic with any problems, questions or concerns prior to return visit. I recommended to continue follow-up with PCP and sub-specialists. She verbalized understanding and agreed with the plan.   NCCN guidelines have been consulted in the planning of this patients care.  I spent a total of 32 minutes during this encounter with the patient including review of chart and various tests results, discussions about plan of care and coordination of care plan.   Chinita Patten, MD  03/21/2024 4:23 PM  [facility] - A DEPT OF [facility] [address] [place] [address] Dept: [phone] Dept Fax: [phone]    CHIEF COMPLAINT/ REASON FOR VISIT:   Well-differentiated neuroendocrine tumor, grade 1, with extensive liver metastatic disease, presumed to be small bowel primary.  Right kidney mass, likely separate primary.  Current Treatment: Monthly octreotide  injections started from 07/15/2023.  INTERVAL HISTORY:   Discussed  the use of AI scribe software for clinical note transcription with the patient, who gave verbal consent to proceed.  History of Present Illness Hannah Madden is an 89 year old female with metastatic neuroendocrine tumor to the liver who presents for routine oncology follow-up to assess disease status and management of mild anemia and dependent lower extremity edema.  Over the past two months, she has not experienced new symptoms such as fever, chills, night sweats, abdominal pain, vomiting, or diarrhea. She describes periodic nausea without emesis and persistent, food-dependent gastroesophageal reflux. Her weight is stable at 120 lbs, with no unintentional weight loss, and has fluctuated between 115 and 120 lbs over the past year, down from a prior baseline of 130-133 lbs.  She continues to have a persistent rash, unchanged since her last visit. Dependent edema remains present in one ankle and foot, causing difficulty with certain footwear. The swelling is described as a constant sensation of fluid around the ankle, without diurnal variation. She does not add extra salt to her food but occasionally licks salt from her palm and is aware of the need to moderate her salt intake.  Bowel movements are normal, with no diarrhea, constipation, or changes in elimination. She denies new abdominal pain.  Recent chromogranin A level is 1600, improved from 1868 previously. Mild anemia persists, with hemoglobin values of 10.4 and 10.6 over the last two visits, which appears to be her baseline. Renal and hepatic function, electrolytes, and protein levels are within normal  limits.   I have reviewed the past medical history, past surgical history, social history and family history with the patient and they are unchanged from previous note.  HISTORY OF PRESENT ILLNESS:   ONCOLOGY HISTORY:   Patient presented to the ED with complaints of worsening abdominal pain, back pain and near syncope on 06/19/2023 and was  hospitalized for further evaluation.   CT abdomen and pelvis on 06/20/2023 showed multiple large heterogeneous liver lesions, largest measuring 10.6 cm in the right hepatic lobe and 5.7 cm in the left hepatic lobe.  Also noted was heterogeneous and has a mass in the superior pole of the right kidney measuring 3.5 cm, concerning for renal cell carcinoma.  Mesenteric mass or lymphadenopathy in the left central mesentery measuring 3.9 cm.  CT chest showed no evidence of metastatic disease in the chest.   Urology and interventional radiology was consulted.  Though she may have renal cell carcinoma, liver metastatic disease was not felt to be related as it is not common for renal cell carcinoma to metastasize to liver.  She may have a lesion in the bladder based on the CT imaging and hence outpatient cystoscopy and further workup was planned by urology.   On 06/22/2023, she underwent ultrasound-guided biopsy of the liver lesion.  Pathology showed well-differentiated neuroendocrine tumor, grade 1, Ki-67 index of 2%.  The neoplastic cells were diffusely and strongly positive for the neuroendocrine markers chromogranin and synaptic ficin.  They were negative for cytokeratin 7 and cytokeratin 20.  The tumor exhibits focal patchy nonspecific positivity for PAX 8 which is of uncertain significance.     On her initial consultation with us  on 06/30/2023, chromogranin A was elevated at 1495 ng/mL.  Request submitted for PET dotatate scan.    On 07/09/2023, PET dotatate scan showed bilobar tracer avid liver metastases, large lesion measuring 10.7 x 8.5 cm in the right lobe, 6.4 x 3.8 cm in the left lobe.  Multiple tracer avid lymph nodes identified within the small bowel mesentery, compatible with nodal metastasis.  Focal area of increased radiotracer uptake localized to the loop of small bowel within the left lower quadrant, likely small bowel primary.  No uptake noted in the right kidney lesion.   Patient does not have  carcinoid symptoms.  However given the burden of disease, decision was made to proceed with octreotide  injections, which she started from 07/15/2023.  Plan to continue monthly.   She had urology evaluation for the kidney mass.  She was not deemed to be a surgical candidate.  Cystoscopy showed no bladder tumor.  Surveillance versus IR evaluation for possible ablation was recommended.  Referral sent to IR Dr. Alona.  Restaging PET dotatate scan on 11/17/2023 showed overall stable disease with slight improvement in some of the lesions.  Plan to continue octreotide  injections monthly at 20 mg dose.   REVIEW OF SYSTEMS:   Review of Systems - Oncology  All other pertinent systems were reviewed with the patient and are negative.  ALLERGIES: She is allergic to atorvastatin, macrobid [nitrofurantoin], penicillins, and ciprofloxacin .  MEDICATIONS:  Current Outpatient Medications  Medication Sig Dispense Refill   acetaminophen  (TYLENOL ) 500 MG tablet Take 500 mg by mouth every 6 (six) hours as needed for moderate pain (pain score 4-6).     brimonidine -timolol  (COMBIGAN ) 0.2-0.5 % ophthalmic solution Place 1 drop into the right eye 2 (two) times daily.     brinzolamide  (AZOPT ) 1 % ophthalmic suspension Place 1 drop into the right eye 2 (  two) times daily.     cyanocobalamin  (VITAMIN B12) 1000 MCG tablet Take 1 tablet (1,000 mcg total) by mouth daily. 30 tablet 0   ondansetron  (ZOFRAN ) 4 MG tablet Take 1 tablet (4 mg total) by mouth every 8 (eight) hours as needed for nausea or vomiting. 90 tablet 3   pantoprazole  (PROTONIX ) 40 MG tablet Take 1 tablet (40 mg total) by mouth daily. 90 tablet 3   tobramycin  (TOBREX ) 0.3 % ophthalmic solution Place 1 drop into the right eye as needed (before eye injection).     triamcinolone  cream (KENALOG ) 0.1 % Apply 1 Application topically 2 (two) times daily. 30 g 1   No current facility-administered medications for this visit.     VITALS:   Blood pressure 138/78,  pulse 91, temperature (!) 97.4 F (36.3 C), temperature source Temporal, resp. rate 16, weight 120 lb 4.8 oz (54.6 kg), SpO2 99%.  Wt Readings from Last 3 Encounters:  03/21/24 120 lb 4.8 oz (54.6 kg)  01/26/24 119 lb 4.8 oz (54.1 kg)  12/01/23 116 lb 3.2 oz (52.7 kg)    Body mass index is 20.02 kg/m.     Onc Performance Status - 03/21/24 1100       ECOG Perf Status   ECOG Perf Status Restricted in physically strenuous activity but ambulatory and able to carry out work of a light or sedentary nature, e.g., light house work, office work      KPS SCALE   KPS % SCORE Able to carry on normal activity, minor s/s of disease           PHYSICAL EXAM:   Physical Exam Constitutional:      General: She is not in acute distress.    Appearance: Normal appearance.  HENT:     Head: Normocephalic and atraumatic.  Cardiovascular:     Rate and Rhythm: Normal rate.  Pulmonary:     Effort: Pulmonary effort is normal. No respiratory distress.  Abdominal:     General: There is no distension.  Neurological:     General: No focal deficit present.     Mental Status: She is alert and oriented to person, place, and time.  Psychiatric:        Mood and Affect: Mood normal.        Behavior: Behavior normal.      LABORATORY DATA:   I have reviewed the data as listed.  Results for orders placed or performed in visit on 03/21/24  CMP (Cancer Center only)  Result Value Ref Range   Sodium 140 135 - 145 mmol/L   Potassium 4.5 3.5 - 5.1 mmol/L   Chloride 104 98 - 111 mmol/L   CO2 24 22 - 32 mmol/L   Glucose, Bld 100 (H) 70 - 99 mg/dL   BUN 15 8 - 23 mg/dL   Creatinine 9.07 9.55 - 1.00 mg/dL   Calcium  10.2 8.9 - 10.3 mg/dL   Total Protein 7.5 6.5 - 8.1 g/dL   Albumin 4.6 3.5 - 5.0 g/dL   AST 21 15 - 41 U/L   ALT 8 0 - 44 U/L   Alkaline Phosphatase 104 38 - 126 U/L   Total Bilirubin 0.4 0.0 - 1.2 mg/dL   GFR, Estimated >39 >39 mL/min   Anion gap 13 5 - 15  CBC with Differential  (Cancer Center Only)  Result Value Ref Range   WBC Count 8.9 4.0 - 10.5 K/uL   RBC 3.70 (L) 3.87 - 5.11 MIL/uL  Hemoglobin 10.6 (L) 12.0 - 15.0 g/dL   HCT 66.6 (L) 63.9 - 53.9 %   MCV 90.0 80.0 - 100.0 fL   MCH 28.6 26.0 - 34.0 pg   MCHC 31.8 30.0 - 36.0 g/dL   RDW 85.4 88.4 - 84.4 %   Platelet Count 240 150 - 400 K/uL   nRBC 0.0 0.0 - 0.2 %   Neutrophils Relative % 54 %   Neutro Abs 4.9 1.7 - 7.7 K/uL   Lymphocytes Relative 27 %   Lymphs Abs 2.4 0.7 - 4.0 K/uL   Monocytes Relative 7 %   Monocytes Absolute 0.6 0.1 - 1.0 K/uL   Eosinophils Relative 11 %   Eosinophils Absolute 1.0 (H) 0.0 - 0.5 K/uL   Basophils Relative 1 %   Basophils Absolute 0.1 0.0 - 0.1 K/uL   Immature Granulocytes 0 %   Abs Immature Granulocytes 0.03 0.00 - 0.07 K/uL  Magnesium   Result Value Ref Range   Magnesium  1.9 1.7 - 2.4 mg/dL      RADIOGRAPHIC STUDIES:  NM PET DOTATATE SKULL BASE TO MID THIGH CLINICAL DATA:  Metastatic well differentiated neuroendocrine tumor.  EXAM: NUCLEAR MEDICINE PET SKULL BASE TO THIGH  TECHNIQUE: 3.9 mCi copper  64 DOTATATE was injected intravenously. Full-ring PET imaging was performed from the skull base to thigh after the radiotracer. CT data was obtained and used for attenuation correction and anatomic localization.  COMPARISON:  DOTATATE PET scan 07/09/2023  FINDINGS: NECK  No radiotracer activity in neck lymph nodes.  Incidental CT findings: None  CHEST  No radiotracer accumulation within mediastinal or hilar lymph nodes. No suspicious pulmonary nodules on the CT scan.  Incidental CT finding:Coronary artery calcification and aortic atherosclerotic calcification.  ABDOMEN/PELVIS  Again demonstrated multiple large intense radiotracer avid liver metastasis. Lesions not changed in number intensity compared to prior. For example dominant lesion in the RIGHT hepatic lobe measuring 12 cm with SUV max 29 compares to SUV max equal 2.6. Significant  change in size. Intensely radiotracer avid lesion in the lateral aspect the LEFT hepatic lobe measures 5.6 cm compared to 5.9 cm with SUV max equal 30.6 compared SUV max equal 29 2. No new lesions are present  Within the LEFT upper quadrant mesenteric mass measuring 3.4 cm with SUV max equal 36 is unchanged (image 118) no new mesenteric lesions are identified. No evidence of bowel obstruction.  Physiologic activity noted in the liver, spleen, adrenal glands and kidneys.  Incidental CT findings:Bladder is moderately distended. Post hysterectomy. Atherosclerotic calcification of the aorta.  SKELETON  No focal activity to suggest skeletal metastasis.  Incidental CT findings:None  IMPRESSION: 1. No change in size or radiotracer activity of multiple large liver metastasis. 2. No change in size or radiotracer activity of LEFT upper quadrant mesenteric mass. 3. No new sites of metastatic disease. 4. No evidence of bowel obstruction. 5.  Aortic Atherosclerosis (ICD10-I70.0).  Electronically Signed   By: Jackquline Boxer M.D.   On: 11/17/2023 16:52    CODE STATUS:  Code Status History     Date Active Date Inactive Code Status Order ID Comments User Context   06/20/2023 0855 06/23/2023 1921 Full Code [id number]  Celinda Alm Lot, MD Inpatient   06/20/2023 0855 06/20/2023 0855 Full Code [id number]  Celinda Alm Lot, MD Inpatient   09/12/2013 1422 09/13/2013 1539 Full Code [id number]  Addie Cordella Hamilton, MD Inpatient    Questions for Most Recent Historical Code Status (Order [id number])     Question Answer  By: Consent: discussion documented in EHR            Orders Placed This Encounter  Procedures   NM PET DOTATATE SKULL BASE TO MID THIGH    Standing Status:   Future    Expected Date:   05/30/2024    Expiration Date:   03/21/2025    If indicated for the ordered procedure, I authorize the administration of a radiopharmaceutical per Radiology protocol:   Yes    Preferred  imaging location?:   [place]   CBC with Differential (Cancer Center Only)    Standing Status:   Future    Expiration Date:   03/21/2025   CMP (Cancer Center only)    Standing Status:   Future    Expiration Date:   03/21/2025   Lactate dehydrogenase    Standing Status:   Future    Expiration Date:   03/21/2025   Magnesium     Standing Status:   Future    Expiration Date:   03/21/2025   Chromogranin A    Standing Status:   Future    Expiration Date:   03/21/2025    This document was completed utilizing speech recognition software. Grammatical errors, random word insertions, pronoun errors, and incomplete sentences are an occasional consequence of this system due to software limitations, ambient noise, and hardware issues. Any formal questions or concerns about the content, text or information contained within the body of this dictation should be directly addressed to the provider for clarification.   "

## 2024-03-23 LAB — CHROMOGRANIN A: Chromogranin A (ng/mL): 1597 ng/mL — ABNORMAL HIGH (ref 0.0–101.8)

## 2024-04-18 ENCOUNTER — Inpatient Hospital Stay

## 2024-05-16 ENCOUNTER — Inpatient Hospital Stay

## 2024-06-13 ENCOUNTER — Inpatient Hospital Stay

## 2024-06-13 ENCOUNTER — Inpatient Hospital Stay: Admitting: Oncology
